# Patient Record
Sex: Male | Born: 1950 | Race: White | Hispanic: No | Smoking: Current every day smoker
Health system: Southern US, Community
[De-identification: ages and names within clinical notes are randomized; demographics above are authoritative.]

## PROBLEM LIST (undated history)

## (undated) DIAGNOSIS — G629 Polyneuropathy, unspecified: Secondary | ICD-10-CM

## (undated) DIAGNOSIS — E119 Type 2 diabetes mellitus without complications: Secondary | ICD-10-CM

## (undated) DIAGNOSIS — K56600 Partial intestinal obstruction, unspecified as to cause: Secondary | ICD-10-CM

## (undated) DIAGNOSIS — L511 Stevens-Johnson syndrome: Secondary | ICD-10-CM

## (undated) DIAGNOSIS — D751 Secondary polycythemia: Secondary | ICD-10-CM

## (undated) DIAGNOSIS — I2510 Atherosclerotic heart disease of native coronary artery without angina pectoris: Secondary | ICD-10-CM

## (undated) DIAGNOSIS — C801 Malignant (primary) neoplasm, unspecified: Secondary | ICD-10-CM

## (undated) DIAGNOSIS — Z8614 Personal history of Methicillin resistant Staphylococcus aureus infection: Secondary | ICD-10-CM

## (undated) DIAGNOSIS — E782 Mixed hyperlipidemia: Secondary | ICD-10-CM

## (undated) DIAGNOSIS — I10 Essential (primary) hypertension: Secondary | ICD-10-CM

## (undated) DIAGNOSIS — C3490 Malignant neoplasm of unspecified part of unspecified bronchus or lung: Secondary | ICD-10-CM

## (undated) DIAGNOSIS — G4733 Obstructive sleep apnea (adult) (pediatric): Secondary | ICD-10-CM

## (undated) DIAGNOSIS — I739 Peripheral vascular disease, unspecified: Secondary | ICD-10-CM

## (undated) HISTORY — DX: Mixed hyperlipidemia: E78.2

## (undated) HISTORY — PX: HEMORRHOID SURGERY: SHX153

## (undated) HISTORY — DX: Obstructive sleep apnea (adult) (pediatric): G47.33

## (undated) HISTORY — DX: Polyneuropathy, unspecified: G62.9

## (undated) HISTORY — DX: Partial intestinal obstruction, unspecified as to cause: K56.600

## (undated) HISTORY — PX: APPENDECTOMY: SHX54

## (undated) HISTORY — DX: Personal history of Methicillin resistant Staphylococcus aureus infection: Z86.14

## (undated) HISTORY — DX: Malignant (primary) neoplasm, unspecified: C80.1

## (undated) HISTORY — DX: Peripheral vascular disease, unspecified: I73.9

## (undated) HISTORY — PX: BACK SURGERY: SHX140

## (undated) HISTORY — DX: Stevens-Johnson syndrome: L51.1

## (undated) HISTORY — DX: Atherosclerotic heart disease of native coronary artery without angina pectoris: I25.10

## (undated) HISTORY — DX: Essential (primary) hypertension: I10

## (undated) HISTORY — PX: NECK SURGERY: SHX720

## (undated) HISTORY — DX: Secondary polycythemia: D75.1

## (undated) HISTORY — DX: Type 2 diabetes mellitus without complications: E11.9

## (undated) HISTORY — PX: VASECTOMY: SHX75

---

## 2002-04-13 ENCOUNTER — Emergency Department (HOSPITAL_COMMUNITY): Admission: EM | Admit: 2002-04-13 | Discharge: 2002-04-13 | Payer: Self-pay | Admitting: Emergency Medicine

## 2007-09-11 ENCOUNTER — Emergency Department: Payer: Self-pay | Admitting: Emergency Medicine

## 2007-09-13 ENCOUNTER — Encounter: Admission: RE | Admit: 2007-09-13 | Discharge: 2007-09-13 | Payer: Self-pay | Admitting: Family Medicine

## 2007-09-13 ENCOUNTER — Emergency Department (HOSPITAL_COMMUNITY): Admission: EM | Admit: 2007-09-13 | Discharge: 2007-09-14 | Payer: Self-pay | Admitting: Emergency Medicine

## 2007-09-13 ENCOUNTER — Encounter: Admission: RE | Admit: 2007-09-13 | Discharge: 2007-09-13 | Payer: Self-pay | Admitting: Orthopaedic Surgery

## 2007-09-17 ENCOUNTER — Emergency Department: Payer: Self-pay | Admitting: Emergency Medicine

## 2007-09-19 ENCOUNTER — Emergency Department (HOSPITAL_COMMUNITY): Admission: EM | Admit: 2007-09-19 | Discharge: 2007-09-19 | Payer: Self-pay | Admitting: Emergency Medicine

## 2007-11-10 ENCOUNTER — Ambulatory Visit (HOSPITAL_COMMUNITY): Admission: RE | Admit: 2007-11-10 | Discharge: 2007-11-11 | Payer: Self-pay | Admitting: Orthopedic Surgery

## 2007-12-29 ENCOUNTER — Emergency Department: Payer: Self-pay | Admitting: Emergency Medicine

## 2008-01-01 ENCOUNTER — Emergency Department (HOSPITAL_COMMUNITY): Admission: EM | Admit: 2008-01-01 | Discharge: 2008-01-01 | Payer: Self-pay | Admitting: Emergency Medicine

## 2008-01-14 ENCOUNTER — Ambulatory Visit: Payer: Self-pay | Admitting: *Deleted

## 2008-01-15 ENCOUNTER — Emergency Department: Payer: Self-pay | Admitting: Emergency Medicine

## 2008-01-15 ENCOUNTER — Inpatient Hospital Stay (HOSPITAL_COMMUNITY): Admission: EM | Admit: 2008-01-15 | Discharge: 2008-01-15 | Payer: Self-pay | Admitting: Emergency Medicine

## 2008-02-04 ENCOUNTER — Emergency Department (HOSPITAL_COMMUNITY): Admission: EM | Admit: 2008-02-04 | Discharge: 2008-02-04 | Payer: Self-pay | Admitting: Emergency Medicine

## 2008-02-24 ENCOUNTER — Emergency Department: Payer: Self-pay | Admitting: Emergency Medicine

## 2008-03-17 ENCOUNTER — Emergency Department: Payer: Self-pay | Admitting: Unknown Physician Specialty

## 2008-03-18 ENCOUNTER — Emergency Department (HOSPITAL_COMMUNITY): Admission: EM | Admit: 2008-03-18 | Discharge: 2008-03-18 | Payer: Self-pay | Admitting: Emergency Medicine

## 2008-06-15 ENCOUNTER — Inpatient Hospital Stay (HOSPITAL_COMMUNITY): Admission: EM | Admit: 2008-06-15 | Discharge: 2008-06-18 | Payer: Self-pay | Admitting: Emergency Medicine

## 2008-11-11 ENCOUNTER — Emergency Department: Payer: Self-pay | Admitting: Emergency Medicine

## 2008-11-29 ENCOUNTER — Emergency Department: Payer: Self-pay | Admitting: Emergency Medicine

## 2009-11-27 ENCOUNTER — Emergency Department (HOSPITAL_COMMUNITY): Admission: EM | Admit: 2009-11-27 | Discharge: 2009-11-27 | Payer: Self-pay | Admitting: Emergency Medicine

## 2009-12-01 ENCOUNTER — Ambulatory Visit: Payer: Self-pay | Admitting: Cardiology

## 2010-07-31 LAB — BASIC METABOLIC PANEL
CO2: 27 mEq/L (ref 19–32)
Calcium: 8.7 mg/dL (ref 8.4–10.5)
GFR calc Af Amer: >60 mL/min (ref >60)
GFR calc non Af Amer: >60 mL/min (ref >60)
Glucose, Bld: 169 mg/dL — ABNORMAL HIGH (ref 70–99)
Potassium: 3.5 mEq/L (ref 3.5–5.1)
Sodium: 134 mEq/L — ABNORMAL LOW (ref 135–145)

## 2010-08-05 LAB — OVA AND PARASITE EXAMINATION: Ova and parasites: NONE SEEN

## 2010-08-05 LAB — COMPREHENSIVE METABOLIC PANEL
AST: 26 U/L (ref 0–37)
AST: 27 U/L (ref 0–37)
Albumin: 3.7 g/dL (ref 3.5–5.2)
Albumin: 4.9 g/dL (ref 3.5–5.2)
BUN: 5 mg/dL — ABNORMAL LOW (ref 6–23)
Calcium: 9.1 mg/dL (ref 8.4–10.5)
Calcium: 9.8 mg/dL (ref 8.4–10.5)
Chloride: 107 mEq/L (ref 96–112)
Creatinine, Ser: 0.84 mg/dL (ref 0.4–1.5)
Creatinine, Ser: 0.89 mg/dL (ref 0.4–1.5)
GFR calc Af Amer: >60 mL/min (ref >60)
GFR calc Af Amer: >60 mL/min (ref >60)
GFR calc non Af Amer: >60 mL/min (ref >60)
GFR calc non Af Amer: >60 mL/min (ref >60)
Sodium: 137 mEq/L (ref 135–145)
Total Bilirubin: 0.8 mg/dL (ref 0.3–1.2)
Total Protein: 8.1 g/dL (ref 6.0–8.3)

## 2010-08-05 LAB — CBC
HCT: 39.4 % (ref 39.0–52.0)
HCT: 43.9 % (ref 39.0–52.0)
Hemoglobin: 14.9 g/dL (ref 13.0–17.0)
MCHC: 33.6 g/dL (ref 30.0–36.0)
MCHC: 33.7 g/dL (ref 30.0–36.0)
MCV: 86.7 fL (ref 78.0–100.0)
MCV: 87.1 fL (ref 78.0–100.0)
MCV: 87.7 fL (ref 78.0–100.0)
Platelets: 128 10*3/uL — ABNORMAL LOW (ref 150–400)
Platelets: 168 10*3/uL (ref 150–400)
RBC: 5.07 MIL/uL (ref 4.22–5.81)
RDW: 13.1 % (ref 11.5–15.5)
WBC: 10.4 10*3/uL (ref 4.0–10.5)
WBC: 8.5 10*3/uL (ref 4.0–10.5)

## 2010-08-05 LAB — BASIC METABOLIC PANEL
BUN: 15 mg/dL (ref 6–23)
BUN: 8 mg/dL (ref 6–23)
CO2: 22 mEq/L (ref 19–32)
Chloride: 106 mEq/L (ref 96–112)
Chloride: 107 mEq/L (ref 96–112)
Creatinine, Ser: 0.73 mg/dL (ref 0.4–1.5)
GFR calc Af Amer: >60 mL/min (ref >60)
GFR calc non Af Amer: >60 mL/min (ref >60)
Glucose, Bld: 156 mg/dL — ABNORMAL HIGH (ref 70–99)
Potassium: 3.4 mEq/L — ABNORMAL LOW (ref 3.5–5.1)
Potassium: 3.6 mEq/L (ref 3.5–5.1)
Sodium: 138 mEq/L (ref 135–145)

## 2010-08-05 LAB — DIFFERENTIAL
Eosinophils Relative: 0 % (ref 0–5)
Lymphocytes Relative: 18 % (ref 12–46)
Lymphs Abs: 2.6 10*3/uL (ref 0.7–4.0)
Monocytes Absolute: 1.1 10*3/uL — ABNORMAL HIGH (ref 0.1–1.0)
Monocytes Relative: 7 % (ref 3–12)

## 2010-08-05 LAB — STOOL CULTURE

## 2010-08-05 LAB — URINALYSIS, ROUTINE W REFLEX MICROSCOPIC
Nitrite: NEGATIVE
Specific Gravity, Urine: 1.011 (ref 1.005–1.030)
Urobilinogen, UA: 0.2 mg/dL (ref 0.0–1.0)
pH: 5.5 (ref 5.0–8.0)

## 2010-08-05 LAB — FECAL LACTOFERRIN, QUANT: Fecal Lactoferrin: NEGATIVE

## 2010-08-05 LAB — URINE MICROSCOPIC-ADD ON

## 2010-08-05 LAB — LIPASE, BLOOD: Lipase: 44 U/L (ref 11–59)

## 2010-08-05 LAB — CLOSTRIDIUM DIFFICILE EIA: C difficile Toxins A+B, EIA: NEGATIVE

## 2010-08-05 LAB — HEMOCCULT GUIAC POC 1CARD (OFFICE): Fecal Occult Bld: NEGATIVE

## 2010-09-02 NOTE — Discharge Summary (Signed)
[name], [name]              ACCOUNT NO.:  [id number]   MEDICAL RECORD NO.:  [id number]          PATIENT TYPE:  INP   LOCATION:  1308                         FACILITY:  [facility]   PHYSICIAN:  Kela Millin, M.D.DATE OF BIRTH:  [birth year]   DATE OF ADMISSION:  06/14/2008  DATE OF DISCHARGE:  06/18/2008                               DISCHARGE SUMMARY   DISCHARGE DIAGNOSES:  1. Partial small-bowel obstruction - resolved.  2. Hypokalemia - resolved.  3. Hypertension.  4. History of chronic low back pain.  5. Tobacco abuse - the patient counseled to quit.  6. History of peptic ulcer disease.   PROCEDURES AND STUDIES:  1. CT scan of the abdomen and pelvis - proximal partial small-bowel      obstruction.  Etiology not determined.  CT scan of pelvis -      contrast is present around to the rectum, indicating a partial      proximal small-bowel obstruction.  No mass or abscess detected.  2. Followup abdominal series - nonobstructed bowel gas pattern, no      free air, no acute cardiopulmonary abnormality.  Suggestion of      gastric wall thickening - artifact versus gastritis.   BRIEF HISTORY:  The patient is a 60 year old white male with the above-  listed medical problems who presented with complaints of abdominal pain  associated with nausea and vomiting.  He had a CT scan of his abdomen,  and the results are as stated above.  Lab work revealed a white cell  count of 14.  An NG tube was placed in the ED, and he was admitted for  further evaluation and management.  Initially he reported that he had  been having diarrhea in the week prior to admission but not at the time  of admission.   Please see the full admission history and physical dictated by Dr.  Rito Ehrlich for the details of the admission, physical exam as well as the  laboratory data.   HOSPITAL COURSE:  1. Partial small-bowel obstruction.  As discussed above, an NG tube      was placed in the ED and later on the same  day of admission after      he came back from walking, he had the NG tube in his hand stating      that it had come out.  He was having small loose bowel movements      and stated that his abdominal pain was decreased, his nausea and      vomiting had also resolved.  The patient was then started on a      clear liquid diet which was advanced as tolerated to a bland diet.      Stool studies were also ordered, including C diff.  The studies are      still pending at the time of this dictation.  The patient has been      tolerating a solid diet with no nausea or vomiting.  So far today,      he has had one bowel movement that is more formed and  did not have      any diarrhea yesterday.  He had been started on Protonix and      subsequently Reglan added, and he has said that this helped with      the burning abdominal pain that he was having previously.  It is      noted that he has a prior history of peptic ulcer disease, and on      the CT it indicated that gastritis could not be excluded.  Followup      abdominal series was done today, and it shows a nonobstructed bowel      gas pattern.  The partial bowel obstruction was only seen on CT      scan of the abdomen, but the patient is clinically better at this      time with formed bowel movements, decreased abdominal pain and no      nausea or vomiting on a solid diet.  His leukocytosis resolved.      His last white cell count prior to discharge was 8.5.  He will be      discharged at this time to followup outpatient.  2. Diarrhea.  The patient reported that he had been having diarrhea      for about 2 months.  Stool studies were ordered.  They are still      pending at the time of this dictation, and as noted above, he has      not had any further diarrhea in the past 2 days.  He is to follow      up with his primary care physician for the results of his stool      studies.  3. Hypokalemia.  His potassium was replaced in the hospital.  4.  Tobacco abuse.  He was counseled to quit tobacco while in the      hospital.  5. Mildly elevated liver function tests.  The patient's alkaline      phosphatase was noted to be 120 with a total bilirubin of 1.4 on      admission.  On recheck, this had resolved - total bilirubin of 0.8      and alkaline phosphatase of 105 on June 17, 2008.  The rest of      his LFTs within normal limits.  6. Hypertension.  The patient is to continue his outpatient      medications upon discharge.  7. History of low back pain.  The patient is to follow up at the Pain      Clinic.   DISCHARGE MEDICATIONS:  1. Prilosec 40 mg one p.o. b.i.d.  2. Reglan 10 mg p.o. q.a.c.  The patient to hold if having diarrhea.  3. The patient to continue atenolol 50 mg daily.  4. Lisinopril 20 mg daily.  5. Percocet p.r.n.  6. Norvasc as previously.   PENDING STUDIES:  Stool studies - C diff.  The patient to follow up with  primary care physician.   FOLLOWUP CARE:  1. Dr. Theresia Lo in 1-2 weeks, to followup on results of stool studies.  2. Pain Clinic as scheduled.   CONDITION ON DISCHARGE:  Improved - stable.      Kela Millin, M.D.  Electronically Signed     ACV/MEDQ  D:  06/18/2008  T:  06/18/2008  Job:  [id number]   cc:   Vikki Ports, M.D.  Fax: [phone]

## 2010-09-02 NOTE — H&P (Signed)
[name], [name]              ACCOUNT NO.:  [id number]   MEDICAL RECORD NO.:  [id number]          PATIENT TYPE:  INP   LOCATION:  1308                         FACILITY:  [facility]   PHYSICIAN:  Hollice Espy, M.D.DATE OF BIRTH:  1951/03/06   DATE OF ADMISSION:  06/14/2008  DATE OF DISCHARGE:                              HISTORY & PHYSICAL   PRIMARY CARE PHYSICIAN:  Dr. Lanell Persons   CHIEF COMPLAINT:  Abdominal pain.   HISTORY OF PRESENT ILLNESS:  The patient is a 60 year old white male  with past medical history of appendix removal decades ago as well as  hypertension and tobacco abuse who for the last couple of days has been  having severe right to middle upper quadrant abdominal pain with nausea  and vomiting.  When the pain got worse, he presented to the emergency  room.  The patient underwent a CT scan of the abdomen which noted a  proximal partial small bowel obstruction.  The rest of his labs were  noted for a white count 14,000.  The patient was given medication for  pain and nausea which helped somewhat but then after placement of an NG  tube, that greatly helped his abdominal discomfort.  He is currently  doing well.  He complains of sore throat secondary to the NG tube and  some mild upper quadrant abdominal pain and is still slightly nauseated.  He denies any headaches, vision changes, no chest pain, palpitations,  shortness breath, wheezing, coughing.  No hematuria, dysuria,  constipation.  He was having diarrhea  last week but not currently. No  focal extremity numbness, weakness or pain.  Review of systems otherwise  negative.   PAST MEDICAL HISTORY:  1. Tobacco abuse about half a pack a day.  2,  Hypertension.  1. Previous history of appendix removal decades ago.   MEDICATIONS:  The patient is on atenolol, lisinopril, Norvasc, Percocet.   ALLERGIES:  VANCOMYCIN and NSAIDs.   SOCIAL HISTORY:  No current alcohol or drug use.  Does smoke about half  pack a day.   FAMILY HISTORY:  Noncontributory.   PHYSICAL EXAMINATION:  VITALS:  On admission, temperature 98, heart rate  86, blood pressure 130/92, respirations 20, O2 sat 98% on room air.  GENERAL:  He is alert and oriented x3, in some mild distress secondary  to pain.  HEENT: Normocephalic atraumatic.  Mucous membranes are slightly dry.  He  has an NG tube.  HEART:  Regular rate and rhythm, S1 and S2.  LUNGS:  Clear to auscultation bilaterally.  ABDOMEN:  The abdomen is slightly soft, tender especially in the upper  quadrants as well as mildly distended. No active bowel sounds.  EXTREMITIES:  No clubbing, cyanosis or edema.   LABORATORY DATA:  Abdominal x-ray shows abnormal bowel gas pattern but  no free air, but the CT scan showed proximal partial small bowel  obstruction.  White blood count 14.5, H&H 16.1 and 49, MCV 87, platelet  count 168,000.  Alcohol level and lipase are normal.  Urinalysis notes  100 protein and mucous.  Stool for C diff has  been ordered and is  pending per the ER.   PLAN:  1. Small bowel obstruction.  Continue n.p.o., NG tube, pain and nausea      control.  Monitor for flatus.  2. Tobacco abuse.  The patient accepts nicotine patch.  3. Hypertension.  Lopressor p.r.n. Holding p.o. medications.  4. History of diarrhea, awaiting C diff cultures.      Hollice Espy, M.D.  Electronically Signed     SKK/MEDQ  D:  06/15/2008  T:  06/15/2008  Job:  [id number]   cc:   Vikki Ports, M.D.  Fax: [phone]

## 2010-09-02 NOTE — H&P (Signed)
NAMEMONTELL, LEOPARD              ACCOUNT NO.:  [id number]   MEDICAL RECORD NO.:  [id number]          PATIENT TYPE:  INP   LOCATION:  1828                         FACILITY:  [facility]   PHYSICIAN:  Glennie Isle, MD   DATE OF BIRTH:  [birth year]   DATE OF ADMISSION:  01/14/2008  DATE OF DISCHARGE:                              HISTORY & PHYSICAL   CHIEF COMPLAINT:  Chest pain and shortness of breath.   HISTORY OF PRESENT ILLNESS:  The patient is a 60 year old Caucasian male  with a history of hypertension, diabetes, tobacco abuse, positive family  history, who presents with substernal chest pain radiating to the back  and left arm after 3:00 p.m.  The patient states the chest pain was  relieved with nitroglycerin and was associated with shortness of breath.  He denies any nausea, vomiting, PND, orthopnea.  He states that the  chest pain was also relieved with morphine as well.  The patient does  state that he has radiculopathic pain, and now it is status post  discectomy and fusion.  States that has had this chest pain before.  He  states that there is more pressure and radiates to the upper left arm.  He had a left heart catheterization in [place] two years ago and was  told that he had mild to moderate coronary artery disease, not requiring  intervention.  He states that he also had some shortness of breath  earlier today.  He does state that he typically walks 3-4 miles a day  without any problems or chest pain.   PAST MEDICAL HISTORY:  1. Hypertension.  2. Diabetes.  3. Neck surgery status post cervical discectomy infusion on November 10, 2007.  4. Peptic ulcer disease.   ALLERGIES:  NSAID AND VANCOMYCIN.   MEDICATIONS:  1. Lisinopril 10 mg daily.  2. Norvasc 10 mg.  3. Atenolol 10 mg.   SOCIAL HISTORY:  One pack per day x30 years.  Patient lives with fiance,  occasional alcohol abuse.   FAMILY HISTORY:  Mother died of an MI at age 58, grandfather died of an  MI at  age 70.   REVIEW OF SYSTEMS:  A 12-point review of systems was performed and was  negative except as noted per HPI.  The patient does also complain of  some neck and back pain.   PHYSICAL EXAMINATION:  VITAL SIGNS:  Temperature 97, pulse 55, blood  pressure 106/66.  GENERAL:  No apparent distress.  HEENT:  Pupils are equal, round and reactive.  Extraocular movements  intact.  Oropharynx clear.  Mucous membranes moist.  NECK:  Supple.  No thyromegaly is noted.  No JVD or bruits.  CARDIOVASCULAR:  S1, S2 are normal.  No murmurs, rubs heard.  No  displacement of PMI.  Pulses are 2+.  LUNGS:  Clear to auscultation bilaterally.  SKIN:  No rashes.  ABDOMEN:  Soft, nontender, nondistended.  EXTREMITIES:  No cyanosis, clubbing or edema is noted.  MUSCULOSKELETAL:  No joint deformities note.  The patient does have  cervical spine tenderness.  NEUROLOGICAL:  Alert  and oriented x3.  Cranial nerves II-XII grossly  intact.  Strength 5/5 upper and lower extremities.  Sensation is normal.   STUDIES:  X-ray:  No acute cardiopulmonary process.  EKG shows normal  sinus rhythm.  The patient is bradycardic with a rate of approximately  50.  There are no ST changes.   LABORATORY DATA:  First set of markers are negative.  Troponin less than  0.05.  CK MB less than 1.0.  All other odds are currently pending.   ASSESSMENT/PLAN:  1. Chest pain.  The patient has multiple risk factors.  He does have a      history of duodenal ulcer several years ago.  However, denies any      melena or hematochezia.  We will start enteric coated aspirin 81 mg      daily and change to metoprolol 12.5 b.i.d. and hold her heart rate      less than 60.  We will continue the patient's ACE inhibitor and      start nitroglycerin.  Will check a fasting lipid panel as well.      The patient was encouraged to stop smoking.  Will check cardiac      markers tonight.  We will consider a stress test, and if the      patient's cardiac  markers are negative, he actually may have this      done as an outpatient as he is currently chest pain free.  The      patient did have good functional status prior including walking      several miles a day without any chest pain.  We will start heparin      given his multiple risk factors.  However, the second set is      negative.  We may discontinue this.  2. Hypertension.  We will change atenolol to metoprolol given sinus      bradycardia and short acting metoprolol.  Will continue with ACE      inhibitor and start nitroglycerin.  Will hold off on calcium      channel blocker tonight.  3. Obstructive sleep apnea.  The patient may use his home CPAP.      Glennie Isle, MD  Electronically Signed     SS/MEDQ  D:  01/14/2008  T:  01/15/2008  Job:  [id number]

## 2010-09-02 NOTE — Op Note (Signed)
NAMECLARE, CASTO              ACCOUNT NO.:  [id number]   MEDICAL RECORD NO.:  [id number]          PATIENT TYPE:  OIB   LOCATION:  3533                         FACILITY:  [facility]   PHYSICIAN:  Alvy Beal, MD    DATE OF BIRTH:  1950-05-31   DATE OF PROCEDURE:  DATE OF DISCHARGE:  11/11/2007                               OPERATIVE REPORT   PREOPERATIVE DIAGNOSIS:  Degenerative cervical disk disease with left  radicular arm pain C5-6 and C6-7.   POSTOPERATIVE DIAGNOSIS:  Degenerative cervical disk disease with left  radicular arm pain C5-6 and C6-7.   OPERATIVE PROCEDURE:  Anterior cervical diskectomy and fusion.   INSTRUMENTATION USED:  Synthes anterior cervical vector plate with 16-XW  variable screws placed into the body of C5, 14-mm screws into the bodies  of C5 and C6, 7-mm fibular allograft struts packed with Actifuse.  No  other complications.   FIRST ASSISTANT:  Crissie Reese, PA   HISTORY:  This is a very pleasant 60 year old gentleman who is having  significant neck and left arm pain for sometime now.  Attempts of  conservative management consisting of physical therapy, injection  therapy, narcotic medications, and traction failed.  Clinical and  radiographic analysis confirmed the diagnosis of cervical hard disk  osteophyte with radicular arm pain.  As a result of failure of  conservative management, he elected to proceed with surgery.  All  appropriate risks, benefits, and alternatives were discussed and  consent was obtained.   OPERATIVE NOTE:  The patient was brought to the operating room and  placed supine on the operating table.  After successful induction of  general anesthesia and endotracheal intubation, TED, SCDs, and a Foley  were applied.  The patient's arm was secured to side.  Towels were  placed on anterior shoulder blades and shoulders themselves were taped  down.  The anterior cervical spine was then prepped and draped in a  standard  fashion.   A horizontal incision was made just below the level of the thyroid  cartilage and sharp dissection was carried out down through the  platysma.  I then sharply dissected through the deep cervical fascia  keeping the sternocleidomastoid muscle lateral.  I then was able to  palpate the trachea and esophagus and mobilize it with a finger, meet to  the right-hand side.  The carotid sheath was palpated and protected  laterally.  I then placed an appendiceal retractor and then used a  Pension scheme manager to mobilize the prevertebral fascia to completely  expose the anterior longitudinal ligament.  I then placed a needle into  the body at C5-6 disk space, took an x-ray, and confirmed the  appropriate level.   I then mobilized the anterior longitudinal ligament from the midbody of  C5 to the midbody of C7 and mobilized the longus colli muscles out to  the level of the uncovertebral joints bilaterally.  At this point, the  anterior cervical spine was appropriately visualized and self-retaining  retractors were placed into the wound.  The endotracheal cuff was  deflated.  The Charnley retractor was expanded and the  tracheal cuff was  reinflated.  Distraction pins were placed into the body of C5 and C6 and  C5-6 disk space was distracted.  I incised the annulus with a 15 blade  scalpel, then using a combination of pituitary rongeurs, curettes and  Kerrison rongeurs, I removed all of the disk material.  I then used a  fine microcurette and micro nerve hook to develop a plane underneath the  posterior longitudinal ligament.  I then resected the entire posterior  longitudinal ligament.  There was a rather substantial free fragment  disk material on that left hand side that I was able to remove.   At this point with the diskectomy complete, the posterior longitudinal  excised from adequate space, I used a rasp to decorticate the  subchondral bone and then measured the interbody space.  I then  passed a  7-mm precut fibular allograft strut with Actifuse and  malleted it to  appropriate depth.  I then removed the distraction pins of C5 and placed  a new one into the body of C7.  I distracted the C6-7 disk space in a  similar fashion to what was performed at C5-6.  I performed another  diskectomy at C6-7.  Once I had adequately excised the disk material,  and I measured the interbody space and placed a 7-mm precut fibular  allograft strut packed with Actifuse to the appropriate depth.  At this  point, we had both decompressions and diskectomies complete.  I then  contoured a 34 mm long vector anterior cervical Synthes vector plate and  then secured it to the bodies of C5, C6 and C7 with variable angle self-  drilling screws.  I removed all the remaining retractors and then  checked again to ensure that the esophagus was not entrapped beneath the  plate at any point.  Once I was confirmed this, I irrigated copiously  with normal saline, returned the trachea and esophagus to midline and  then closed the platysma with interrupted 2-0 Vicryl sutures, skin with  a 3-0 Monocryl.  Steri-Strips, dry dressing, and a collar were applied.  The patient was extubated and transferred to PACU without incident.      Alvy Beal, MD  Electronically Signed     DDB/MEDQ  D:  11/10/2007  T:  11/11/2007  Job:  [id number]

## 2010-09-05 NOTE — Discharge Summary (Signed)
NAMEHOBART, [name]              ACCOUNT NO.:  [id number]   MEDICAL RECORD NO.:  [id number]          PATIENT TYPE:  INP   LOCATION:  2014                         FACILITY:  [facility]   PHYSICIAN:  Bevelyn Buckles. Bensimhon, MDDATE OF BIRTH:  1951/04/12   DATE OF ADMISSION:  01/14/2008  DATE OF DISCHARGE:  01/15/2008                               DISCHARGE SUMMARY   DISCHARGE DIAGNOSES:  1. Chest pain.  2. Left against medical advice.   HOSPITAL COURSE:  Mr. Durbin is a 60 year old male with a history of  hypertension, diabetes, ongoing tobacco use, and chronic back and neck  pain, who presented to the [facility] Emergency Room on January 14, 2008, complaining of chest pain that radiated to his back and upper left  arm.  Several weeks prior to presentation, he had a diskectomy and had  chronic neck and arm pain with this.  He also had a cardiac  catheterization approximately 2 years prior to his presentation in  [place] which he said he had mild-to-moderate coronary artery disease.  While in the emergency room, his pain was controlled with narcotic  medication.  His EKG and the cardiac markers were normal.  He was  admitted to rule out myocardial infarction.   While on the floor, he continued to have severe back and neck pain;  however, his chest pain was resolved.  He got quite upset when he did  not receive adequate narcotic medication and threatened to leave against  medical advice and unless he was given Dilaudid PCA pump.  As he did not  get this, he eventually pulled his IV out and left against the medical  advice.  This was well documented by the nursing staff.  He is advised  to followup with his primary care physician.      Bevelyn Buckles. Bensimhon, MD  Electronically Signed     DRB/MEDQ  D:  04/06/2008  T:  04/07/2008  Job:  [id number]

## 2010-12-21 DIAGNOSIS — R079 Chest pain, unspecified: Secondary | ICD-10-CM

## 2010-12-23 ENCOUNTER — Ambulatory Visit (HOSPITAL_COMMUNITY)
Admission: AD | Admit: 2010-12-23 | Discharge: 2010-12-24 | Disposition: A | Discharge status: Home / Self Care | Payer: Self-pay | Source: Other Acute Inpatient Hospital | Attending: Cardiology | Admitting: Cardiology

## 2010-12-23 DIAGNOSIS — E871 Hypo-osmolality and hyponatremia: Secondary | ICD-10-CM | POA: Insufficient documentation

## 2010-12-23 DIAGNOSIS — I2510 Atherosclerotic heart disease of native coronary artery without angina pectoris: Secondary | ICD-10-CM

## 2010-12-23 DIAGNOSIS — R079 Chest pain, unspecified: Secondary | ICD-10-CM | POA: Insufficient documentation

## 2010-12-23 DIAGNOSIS — M5030 Other cervical disc degeneration, unspecified cervical region: Secondary | ICD-10-CM | POA: Insufficient documentation

## 2010-12-23 DIAGNOSIS — F17200 Nicotine dependence, unspecified, uncomplicated: Secondary | ICD-10-CM | POA: Insufficient documentation

## 2010-12-23 DIAGNOSIS — I10 Essential (primary) hypertension: Secondary | ICD-10-CM | POA: Insufficient documentation

## 2010-12-23 DIAGNOSIS — Z8711 Personal history of peptic ulcer disease: Secondary | ICD-10-CM | POA: Insufficient documentation

## 2010-12-23 DIAGNOSIS — I200 Unstable angina: Secondary | ICD-10-CM

## 2010-12-23 DIAGNOSIS — D696 Thrombocytopenia, unspecified: Secondary | ICD-10-CM | POA: Insufficient documentation

## 2010-12-23 DIAGNOSIS — Z8249 Family history of ischemic heart disease and other diseases of the circulatory system: Secondary | ICD-10-CM | POA: Insufficient documentation

## 2010-12-23 DIAGNOSIS — E119 Type 2 diabetes mellitus without complications: Secondary | ICD-10-CM | POA: Insufficient documentation

## 2010-12-23 DIAGNOSIS — I701 Atherosclerosis of renal artery: Secondary | ICD-10-CM

## 2010-12-23 LAB — POCT ACTIVATED CLOTTING TIME: Activated Clotting Time: 122 seconds

## 2010-12-23 LAB — COMPREHENSIVE METABOLIC PANEL
Albumin: 3.4 g/dL — ABNORMAL LOW (ref 3.5–5.2)
Alkaline Phosphatase: 72 U/L (ref 39–117)
BUN: 23 mg/dL (ref 6–23)
CO2: 26 mEq/L (ref 19–32)
Chloride: 96 mEq/L (ref 96–112)
Creatinine, Ser: 1.1 mg/dL (ref 0.50–1.35)
GFR calc Af Amer: >60 mL/min (ref >60)
GFR calc non Af Amer: >60 mL/min (ref >60)
Glucose, Bld: 205 mg/dL — ABNORMAL HIGH (ref 70–99)
Potassium: 4.1 mEq/L (ref 3.5–5.1)
Total Bilirubin: 0.3 mg/dL (ref 0.3–1.2)

## 2010-12-23 LAB — CBC
HCT: 50.7 % (ref 39.0–52.0)
MCHC: 34.3 g/dL (ref 30.0–36.0)
MCV: 86.8 fL (ref 78.0–100.0)
Platelets: 144 10*3/uL — ABNORMAL LOW (ref 150–400)
RDW: 14.7 % (ref 11.5–15.5)

## 2010-12-24 DIAGNOSIS — R079 Chest pain, unspecified: Secondary | ICD-10-CM

## 2010-12-24 LAB — BASIC METABOLIC PANEL
Calcium: 8.5 mg/dL (ref 8.4–10.5)
GFR calc Af Amer: >60 mL/min (ref >60)
GFR calc non Af Amer: >60 mL/min (ref >60)
Glucose, Bld: 180 mg/dL — ABNORMAL HIGH (ref 70–99)
Sodium: 132 mEq/L — ABNORMAL LOW (ref 135–145)

## 2010-12-24 LAB — CBC
Hemoglobin: 18.3 g/dL — ABNORMAL HIGH (ref 13.0–17.0)
MCH: 30.6 pg (ref 26.0–34.0)
MCHC: 34.7 g/dL (ref 30.0–36.0)
RDW: 14.8 % (ref 11.5–15.5)

## 2010-12-24 LAB — HEMOGLOBIN A1C: Hgb A1c MFr Bld: 9.4 % — ABNORMAL HIGH (ref <5.7)

## 2010-12-29 NOTE — Cardiovascular Report (Signed)
NAMEJONAN, SEUFERT NO.:  [id number]  MEDICAL RECORD NO.:  [id number]  LOCATION:  2902                         FACILITY:  [facility]  PHYSICIAN:  Vesta Mixer, M.D. DATE OF BIRTH:  Aug 01, 1950  DATE OF PROCEDURE:  12/23/2010 DATE OF DISCHARGE:                           CARDIAC CATHETERIZATION   [name] is a 60 year old gentleman with a long history of cigarette smoking.  He presents with episodes of chest pain.  He has been found to have a moderate coronary artery disease in the past.  He was also found to have an extremely high hemoglobin and high hematocrit. His hematocrit is 50-60%.  He is scheduled for cardiac catheterization.  The procedure was left heart catheterization with coronary angiography. The right femoral artery was cannulated using the assistance of a SmartNeedle.  His pulses were somewhat diminished.  HEMODYNAMIC RESULTS:  LV pressure was 103/12.  Aortic pressure was 104/58.  ANGIOGRAPHY:  Left main.  The left main is fairly normal.  The left anterior descending artery has only minor luminal irregularities.  The flow down the LAD is somewhat sluggish, but there are no critical lesions to contribute to this.  I suspect that there is some sludging because of the high hematocrit.  His first diagonal artery has a 40-50% stenosis at its origin.  The left circumflex artery is large and dominant.  It gives off a large first obtuse marginal artery which is normal.  The distal circumflex is unremarkable.  The posterior descending artery and the posterolateral segment artery are normal.  The ramus intermediate vessel has minor luminal irregularities.  The right coronary artery is small and is nondominant.  There are minor to moderate irregularities.  The RV marginal branch is normal.  The left ventriculogram was performed in a 30-RAO position and reveals normal left ventricular systolic function.  Ejection fraction is approximately  60-65%.  The distal aortogram was performed because of some difficulty in getting up into the central circulation.  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  This stenosis does not appear to be critical at this time.  COMPLICATIONS:  None.  CONCLUSIONS: 1. Minor coronary artery irregularities. 2. Normal left ventricular systolic function. 3. He has sluggish flow which is particularly visible down the left     anterior descending.  I think that this is possibly due to his     elevated hematocrit and possible sludging.  There are no lesions to     address using percutaneous coronary intervention.  The patient     definitely needs to quit smoking, so that his hemoglobin will come     down towards normal.  He may need a phlebotomy.  The patient also has significant peripheral vascular disease, although I am not sure that he is  symptomatic with this.  He will need to follow up with our Vascular or perhaps [facility] for further evaluation at some point.  The lesion is not critical and so I do not think that he necessarily needs revascularization during this admission.     Vesta Mixer, M.D.     PJN/MEDQ  D:  12/23/2010  T:  12/24/2010  Job:  [id number]  cc:   Jonelle Sidle, MD  Electronically Signed by Kristeen Miss M.D. on 12/29/2010 07:45:13 PM

## 2011-01-01 NOTE — Discharge Summary (Signed)
  [name], [name]              ACCOUNT NO.:  [id number]  MEDICAL RECORD NO.:  [id number]  LOCATION:  2902                         FACILITY:  [facility]  PHYSICIAN:  Verne Carrow, MDDATE OF BIRTH:  [birth year]  DATE OF ADMISSION:  12/23/2010 DATE OF DISCHARGE:  12/24/2010                              DISCHARGE SUMMARY   DISCHARGE MEDICATIONS: 1. Lisinopril/hydrochlorothiazide 20/12.5 mg b.i.d. 2. Gabapentin 600 mg one and a half tablets t.i.d. 3. Atenolol 100 mg a day. 4. Metformin 500 mg two tablets b.i.d., hold for two days and restart     on December 26, 2010. 5. Norvasc 5 mg is discontinued. 6. Norvasc 10 mg daily. 7. Humulin 70/30 9 units q.a.m. 8. Pravachol 80 mg daily. 9. Sliding scale NovoLog as prior to admission. 10.Aspirin 81 mg a day. 11.Glucotrol XL 5 mg b.i.d.     Theodore Demark, PA-C   ______________________________ Verne Carrow, MD    RB/MEDQ  D:  12/24/2010  T:  12/24/2010  Job:  [id number]  Electronically Signed by Theodore Demark PA-C on 01/01/2011 03:24:36 PM Electronically Signed by Verne Carrow MD on 01/01/2011 09:36:40 PM

## 2011-01-01 NOTE — Discharge Summary (Signed)
[name], [name]              ACCOUNT NO.:  [id number]  MEDICAL RECORD NO.:  [id number]  LOCATION:  2902                         FACILITY:  [facility]  PHYSICIAN:  Verne Carrow, MDDATE OF BIRTH:  1950-10-27  DATE OF ADMISSION:  12/23/2010 DATE OF DISCHARGE:  12/24/2010                              DISCHARGE SUMMARY   PROCEDURES: 1. Cardiac catheterization. 2. Coronary arteriogram. 3. Left ventriculogram. 4. Distal aortogram.  PRIMARY FINAL DISCHARGE DIAGNOSIS:  Chest pain.  SECONDARY DIAGNOSES: 1. Diabetes. 2. Hypertension. 3. Ongoing tobacco use. 4. History of peptic ulcer disease. 5. Partial small bowel obstruction in 2010, managed medically. 6. Degenerative cervical disk disease, status post surgery. 7. Family history of coronary artery disease. 8. Allergy or intolerance to VANCOMYCIN and PREDNISONE. 9. Elevated hemoglobin and hematocrit, hemoglobin 18.3 and hematocrit     52.7 at discharge. 10.Thrombocytopenia with platelet count of 126 at discharge. 11.Hyponatremia with a sodium of 132 at discharge. 12.History of sleep apnea. 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 14.Status post back surgery x3, hemorrhoid surgery and appendectomy.  TIME AT DISCHARGE:  31 minutes.  HOSPITAL COURSE:  [name] is a 60 year old male with a history of nonobstructive coronary artery disease in the past.  He went to [facility] with chest pain.  His symptoms were concerning for unstable anginal pain.  He was transferred to [facility] and had ongoing pain, so he was taken directly to the Cath Lab.  The cardiac catheterization showed somewhat sluggish flow down the LAD, but there were no critical lesions.  The sludging was felt secondary to a high hematocrit.  The first diagonal had a 50% stenosis.  The circumflex was dominant and had no significant disease.  The ramus intermedius had luminal irregularities and the RCA had minor to  moderate irregularities.  His EF was 60-65%.  A distal aortogram was performed because of difficulty getting into his central circulation.  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  That stenosis did not appear to be critical.  [name] was held overnight and hydrated.  On December 24, 2010, [name] was seen by Dr. Clifton James.  Statin was added to his medication regimen and his blood pressure medicine was increased.  Dr. Clifton James considered him stable for discharge, to follow up as an outpatient.  DISCHARGE INSTRUCTIONS: 1. His activity level is to be increased gradually. 2. He is not to drive for 2 days and no lifting for a week. 3. He is to call our office for problems with cath site. 4. He is encouraged to stick to a low-sodium, diabetic diet. 5. He is encouraged to stop smoking. 6. He is to follow up with Dr. Diona Browner at the [facility] office on     February 13, 2011, at 3:40. 7. He is encouraged to follow up with primary care as well.  DISCHARGE MEDICATIONS:  Will be dictated separately.      Theodore Demark, PA-C   ______________________________ Verne Carrow, MD    RB/MEDQ  D:  12/24/2010  T:  12/24/2010  Job:  [id number]  Electronically Signed by Theodore Demark PA-C on 01/01/2011 03:25:11 PM Electronically Signed by Verne Carrow  MD on 01/01/2011 09:36:42 PM

## 2011-01-16 LAB — CBC
HCT: 39.1
HCT: 40.7
Hemoglobin: 13.4
MCV: 85.6
MCV: 85.8
Platelets: 164
Platelets: 167
RDW: 13.5
RDW: 13.9

## 2011-01-16 LAB — BASIC METABOLIC PANEL
BUN: 11
BUN: 13
Chloride: 106
Creatinine, Ser: 0.85
GFR calc non Af Amer: >60
GFR calc non Af Amer: >60
Glucose, Bld: 101 — ABNORMAL HIGH
Potassium: 3.8

## 2011-01-19 LAB — PROTIME-INR
INR: 0.9
Prothrombin Time: 12.5

## 2011-01-19 LAB — DIFFERENTIAL
Basophils Relative: 0
Lymphocytes Relative: 28
Lymphs Abs: 3.3
Monocytes Absolute: 1.2 — ABNORMAL HIGH
Monocytes Relative: 10
Neutro Abs: 7.2

## 2011-01-19 LAB — BASIC METABOLIC PANEL
BUN: 11
CO2: 27
Chloride: 103
Creatinine, Ser: 0.79
GFR calc Af Amer: >60
Potassium: 3.1 — ABNORMAL LOW

## 2011-01-19 LAB — APTT: aPTT: 27

## 2011-01-19 LAB — CBC
HCT: 44.3
MCHC: 33.5
MCV: 88.3
RBC: 5.02

## 2011-01-19 LAB — GLUCOSE, CAPILLARY: Glucose-Capillary: 107 — ABNORMAL HIGH

## 2011-01-19 LAB — LIPID PANEL
Cholesterol: 196
LDL Cholesterol: 112 — ABNORMAL HIGH

## 2011-01-19 LAB — TROPONIN I: Troponin I: <0.01

## 2011-01-19 LAB — CK TOTAL AND CKMB (NOT AT ARMC): Relative Index: INVALID

## 2011-01-19 LAB — HEMOGLOBIN A1C: Mean Plasma Glucose: 123

## 2011-02-11 ENCOUNTER — Telehealth: Payer: Self-pay | Admitting: Cardiology

## 2011-02-11 NOTE — Telephone Encounter (Signed)
Pt is returning call, he is unsure what the call is about. He doesn't have voice mail, but did confirm his appointment for this Friday. Stated you could just try back if you need to talk to him.

## 2011-02-12 ENCOUNTER — Encounter: Payer: Self-pay | Admitting: Cardiology

## 2011-02-13 ENCOUNTER — Ambulatory Visit (INDEPENDENT_AMBULATORY_CARE_PROVIDER_SITE_OTHER): Payer: Self-pay | Admitting: Cardiology

## 2011-02-13 ENCOUNTER — Encounter: Payer: Self-pay | Admitting: Cardiology

## 2011-02-13 VITALS — BP 122/71 | HR 71 | Ht 71.0 in | Wt 197.0 lb

## 2011-02-13 DIAGNOSIS — I2510 Atherosclerotic heart disease of native coronary artery without angina pectoris: Secondary | ICD-10-CM | POA: Insufficient documentation

## 2011-02-13 DIAGNOSIS — Z720 Tobacco use: Secondary | ICD-10-CM | POA: Insufficient documentation

## 2011-02-13 DIAGNOSIS — I10 Essential (primary) hypertension: Secondary | ICD-10-CM | POA: Insufficient documentation

## 2011-02-13 DIAGNOSIS — E782 Mixed hyperlipidemia: Secondary | ICD-10-CM | POA: Insufficient documentation

## 2011-02-13 DIAGNOSIS — F17200 Nicotine dependence, unspecified, uncomplicated: Secondary | ICD-10-CM

## 2011-02-13 DIAGNOSIS — I739 Peripheral vascular disease, unspecified: Secondary | ICD-10-CM

## 2011-02-13 NOTE — Assessment & Plan Note (Signed)
Distal angiography demonstrated a 70% right iliac stenosis at catheterization. He could certainly have some claudication symptoms referable to this, and we discussed this today, as well as other functional testing that could be arranged. However, in light of his reported significant prior back surgeries and "nerve problems" it is most likely that his back and leg discomfort is multifactorial. At this point, I recommended strongly that he quit smoking, and consider a basic walking regimen. I also offered lower extremity ABIs, however he did not want to pursue this testing at this point.

## 2011-02-13 NOTE — Assessment & Plan Note (Addendum)
Nonobstructive, recently documented at cardiac catheterization. He could have intermittent symptoms related to endothelial dysfunction and/or microvascular dysfunction, however this would be managed medically and with further lifestyle modification. I discussed this in detail with him today, as reviewed above. LVEF is normal as well. He was actually supposed to follow up with Dr. Andee Lineman, as he had seen him in the past. Appropriate followup will be arranged.

## 2011-02-13 NOTE — Progress Notes (Signed)
Clinical Summary [name] is a 60 y.o.male presenting for a post hospital followup. He was seen by Dr. Andee Campbell last year at [facility] in the setting of chest pain, had a reassuring Cardiolite done at that time. More recently in September, he presented again with chest pain to [facility], ruled out for myocardial infarction, and was transferred to [facility] for a diagnostic cardiac catheterization in light of his robust cardiac risk profile. Fortunately, he did not have evidence of obstructive CAD, essentially mild to moderate atherosclerosis with slow flow noted in the LAD. It was suggested that this could be related to his relative erythrocytosis (likely secondary to smoking), though certainly with his risk factor profile, endothelial dysfunction and microvascular dysfunction are just as likely. He was also diagnosed with PAD involving the right iliac.  He is here with his girlfriend today. He denies any chest pain. He states he is undergoing disability determinations, mainly related to a reported long term history of significant lower back pain and leg pain, that he states limits his ability to function in a stable job. I do not have any details about his prior back surgery, or objective assessment of his functional limitation in this regard.  I reviewed the results of his cardiac catheterization, as well as findings of right iliac disease. I discussed with him in detail strategies for smoking cessation, and strongly recommended smoking cessation to reduce future adverse cardiac and peripheral vascular events. I also explained the importance of maintaining follow up with a primary care provider for close management of diabetes and hypertension over time. His LDL was noted to be well under 100. A regular walking regimen as tolerated would also be optimal.  We discussed mainly lifestyle modification strategies today, as revascularization options at this point would not significantly alter his risk of  MI or cardiac death.  Patient states that he sees Dr. Greggory Campbell at the [facility] in [place].  Allergies  Allergen Reactions  . Vancomycin     Caused Stevens-Johnsons syndrome    Medication list reviewed.  Past Medical History  Diagnosis Date  . Essential hypertension, benign   . Type 2 diabetes mellitus   . Mixed hyperlipidemia   . OSA (obstructive sleep apnea)   . Partial small bowel obstruction   . Stevens-Johnson syndrome     Vancomycin  . Coronary atherosclerosis of native coronary artery     Nonobstructive  . Erythrocytosis   . Peripheral arterial disease     70% right iliac  . History of MRSA infection     Past Surgical History  Procedure Date  . Appendectomy   . Neck surgery   . Back surgery   . Hemorrhoid surgery     Family History  Problem Relation Age of Onset  . Coronary artery disease    . Hypertension      Social History [name] reports that he has been smoking Cigarettes.  He has a 40 pack-year smoking history. He has never used smokeless tobacco. [name] reports that he does not drink alcohol.  Review of Systems No palpitations or syncope. Otherwise as outlined above.  Physical Examination Filed Vitals:   02/13/11 1321  BP: 122/71  Pulse: 71   Overweight male in no acute distress. HEENT: Conjunctiva and lids normal, oropharynx with moist mucosa. Neck: Supple, no elevated JVP or carotid bruits, no thyromegaly. Lungs: Clear to auscultation with diminished breath sounds, no active wheezing or labored breathing. Cardiac: Regular rate and rhythm, no  S3 gallop or rub. Abdomen: Soft, nontender, bowel sounds present. Skin: Warm and dry. Studies: No pitting edema, distal pulses one plus. Musculoskeletal: No kyphosis. Neuropsychiatric: Alert and oriented x3, moves all extremities.    Problem List and Plan

## 2011-02-13 NOTE — Assessment & Plan Note (Signed)
Blood pressure is well-controlled today. 

## 2011-02-13 NOTE — Assessment & Plan Note (Signed)
Recommend LDL control under 100, continuing statin therapy for now.

## 2011-02-13 NOTE — Patient Instructions (Signed)
Your physician wants you to follow-up in: 6 months with Dr. De Gent. You will receive a reminder letter in the mail one-two months in advance. If you don't receive a letter, please call our office to schedule the follow-up appointment. Your physician recommends that you continue on your current medications as directed. Please refer to the Current Medication list given to you today. 

## 2011-02-13 NOTE — Assessment & Plan Note (Signed)
Smoking cessation was discussed in detail today.

## 2011-04-21 DIAGNOSIS — G629 Polyneuropathy, unspecified: Secondary | ICD-10-CM

## 2011-04-21 HISTORY — DX: Polyneuropathy, unspecified: G62.9

## 2013-04-20 DIAGNOSIS — C801 Malignant (primary) neoplasm, unspecified: Secondary | ICD-10-CM

## 2013-04-20 HISTORY — DX: Malignant (primary) neoplasm, unspecified: C80.1

## 2014-02-14 ENCOUNTER — Other Ambulatory Visit: Payer: Self-pay

## 2014-02-14 ENCOUNTER — Emergency Department (HOSPITAL_COMMUNITY): Payer: Medicare Other

## 2014-02-14 ENCOUNTER — Encounter (HOSPITAL_COMMUNITY): Payer: Self-pay | Admitting: Emergency Medicine

## 2014-02-14 ENCOUNTER — Observation Stay (HOSPITAL_COMMUNITY)
Admission: EM | Admit: 2014-02-14 | Discharge: 2014-02-14 | Disposition: A | Discharge status: Home / Self Care | Payer: Medicare Other | Attending: Family Medicine | Admitting: Family Medicine

## 2014-02-14 DIAGNOSIS — M542 Cervicalgia: Secondary | ICD-10-CM | POA: Diagnosis not present

## 2014-02-14 DIAGNOSIS — G4733 Obstructive sleep apnea (adult) (pediatric): Secondary | ICD-10-CM | POA: Diagnosis not present

## 2014-02-14 DIAGNOSIS — Z794 Long term (current) use of insulin: Secondary | ICD-10-CM | POA: Diagnosis not present

## 2014-02-14 DIAGNOSIS — Z8614 Personal history of Methicillin resistant Staphylococcus aureus infection: Secondary | ICD-10-CM | POA: Diagnosis not present

## 2014-02-14 DIAGNOSIS — K5669 Other intestinal obstruction: Secondary | ICD-10-CM | POA: Diagnosis not present

## 2014-02-14 DIAGNOSIS — E119 Type 2 diabetes mellitus without complications: Secondary | ICD-10-CM | POA: Diagnosis not present

## 2014-02-14 DIAGNOSIS — M545 Low back pain: Secondary | ICD-10-CM | POA: Diagnosis not present

## 2014-02-14 DIAGNOSIS — E782 Mixed hyperlipidemia: Secondary | ICD-10-CM | POA: Insufficient documentation

## 2014-02-14 DIAGNOSIS — I10 Essential (primary) hypertension: Secondary | ICD-10-CM | POA: Diagnosis not present

## 2014-02-14 DIAGNOSIS — Z23 Encounter for immunization: Secondary | ICD-10-CM | POA: Insufficient documentation

## 2014-02-14 DIAGNOSIS — I739 Peripheral vascular disease, unspecified: Secondary | ICD-10-CM | POA: Diagnosis not present

## 2014-02-14 DIAGNOSIS — R0789 Other chest pain: Secondary | ICD-10-CM | POA: Diagnosis not present

## 2014-02-14 DIAGNOSIS — Z872 Personal history of diseases of the skin and subcutaneous tissue: Secondary | ICD-10-CM | POA: Insufficient documentation

## 2014-02-14 DIAGNOSIS — I2510 Atherosclerotic heart disease of native coronary artery without angina pectoris: Secondary | ICD-10-CM | POA: Insufficient documentation

## 2014-02-14 DIAGNOSIS — Z862 Personal history of diseases of the blood and blood-forming organs and certain disorders involving the immune mechanism: Secondary | ICD-10-CM | POA: Diagnosis not present

## 2014-02-14 DIAGNOSIS — R079 Chest pain, unspecified: Secondary | ICD-10-CM | POA: Diagnosis present

## 2014-02-14 DIAGNOSIS — R61 Generalized hyperhidrosis: Secondary | ICD-10-CM | POA: Diagnosis not present

## 2014-02-14 DIAGNOSIS — Z79899 Other long term (current) drug therapy: Secondary | ICD-10-CM | POA: Insufficient documentation

## 2014-02-14 DIAGNOSIS — R0602 Shortness of breath: Secondary | ICD-10-CM

## 2014-02-14 DIAGNOSIS — Z720 Tobacco use: Secondary | ICD-10-CM | POA: Insufficient documentation

## 2014-02-14 LAB — COMPREHENSIVE METABOLIC PANEL
ALBUMIN: 4.3 g/dL (ref 3.5–5.2)
ALT: 28 U/L (ref 0–53)
ANION GAP: 15 (ref 5–15)
AST: 30 U/L (ref 0–37)
Alkaline Phosphatase: 98 U/L (ref 39–117)
BILIRUBIN TOTAL: 0.3 mg/dL (ref 0.3–1.2)
BUN: 27 mg/dL — AB (ref 6–23)
CHLORIDE: 96 meq/L (ref 96–112)
CO2: 24 mEq/L (ref 19–32)
CREATININE: 1.01 mg/dL (ref 0.50–1.35)
Calcium: 9.6 mg/dL (ref 8.4–10.5)
GFR calc Af Amer: >90 mL/min (ref >90)
GFR calc non Af Amer: 78 mL/min — ABNORMAL LOW (ref >90)
Glucose, Bld: 181 mg/dL — ABNORMAL HIGH (ref 70–99)
Potassium: 4.3 mEq/L (ref 3.7–5.3)
Sodium: 135 mEq/L — ABNORMAL LOW (ref 137–147)
Total Protein: 8 g/dL (ref 6.0–8.3)

## 2014-02-14 LAB — CBC WITH DIFFERENTIAL/PLATELET
BASOS PCT: 0 % (ref 0–1)
Basophils Absolute: 0 10*3/uL (ref 0.0–0.1)
EOS ABS: 0.2 10*3/uL (ref 0.0–0.7)
Eosinophils Relative: 3 % (ref 0–5)
HEMATOCRIT: 37.4 % — AB (ref 39.0–52.0)
HEMOGLOBIN: 12.5 g/dL — AB (ref 13.0–17.0)
Lymphocytes Relative: 18 % (ref 12–46)
Lymphs Abs: 1.6 10*3/uL (ref 0.7–4.0)
MCH: 29.3 pg (ref 26.0–34.0)
MCHC: 33.4 g/dL (ref 30.0–36.0)
MCV: 87.8 fL (ref 78.0–100.0)
MONO ABS: 0.9 10*3/uL (ref 0.1–1.0)
MONOS PCT: 10 % (ref 3–12)
NEUTROS ABS: 6.2 10*3/uL (ref 1.7–7.7)
Neutrophils Relative %: 69 % (ref 43–77)
Platelets: 151 10*3/uL (ref 150–400)
RBC: 4.26 MIL/uL (ref 4.22–5.81)
RDW: 14.6 % (ref 11.5–15.5)
WBC: 8.9 10*3/uL (ref 4.0–10.5)

## 2014-02-14 LAB — TROPONIN I
Troponin I: <0.3 ng/mL (ref <0.30)
Troponin I: <0.3 ng/mL (ref <0.30)

## 2014-02-14 MED ORDER — SODIUM CHLORIDE 0.9 % IV SOLN
INTRAVENOUS | Status: DC
  Administered 2014-02-14: 20:00:00 via INTRAVENOUS

## 2014-02-14 MED ORDER — INFLUENZA VAC SPLIT QUAD 0.5 ML IM SUSY
0.5000 mL | PREFILLED_SYRINGE | INTRAMUSCULAR | Status: DC
  Filled 2014-02-14: qty 0.5

## 2014-02-14 MED ORDER — TIZANIDINE HCL 4 MG PO TABS: 4.0000 mg | ORAL_TABLET | Freq: Two times a day (BID) | ORAL | Status: DC | PRN

## 2014-02-14 MED ORDER — HEPARIN SODIUM (PORCINE) 5000 UNIT/ML IJ SOLN
5000.0000 [IU] | Freq: Three times a day (TID) | INTRAMUSCULAR | Status: DC
  Filled 2014-02-14: qty 1

## 2014-02-14 MED ORDER — ONDANSETRON HCL 4 MG PO TABS
4.0000 mg | ORAL_TABLET | Freq: Four times a day (QID) | ORAL | Status: DC | PRN
Start: 2014-02-14 — End: 2014-02-15

## 2014-02-14 MED ORDER — GABAPENTIN 600 MG PO TABS
1200.0000 mg | ORAL_TABLET | Freq: Three times a day (TID) | ORAL | Status: DC
  Filled 2014-02-14 (×5): qty 2

## 2014-02-14 MED ORDER — INSULIN ASPART 100 UNIT/ML ~~LOC~~ SOLN
0.0000 [IU] | SUBCUTANEOUS | Status: DC
  Administered 2014-02-14: 3 [IU] via SUBCUTANEOUS

## 2014-02-14 MED ORDER — LISINOPRIL-HYDROCHLOROTHIAZIDE 20-12.5 MG PO TABS: 1.0000 | ORAL_TABLET | Freq: Two times a day (BID) | ORAL | Status: DC

## 2014-02-14 MED ORDER — ASPIRIN EC 325 MG PO TBEC
325.0000 mg | DELAYED_RELEASE_TABLET | Freq: Every day | ORAL | Status: DC
  Administered 2014-02-14: 325 mg via ORAL
  Filled 2014-02-14: qty 1

## 2014-02-14 MED ORDER — ASPIRIN 81 MG PO CHEW
324.0000 mg | CHEWABLE_TABLET | Freq: Once | ORAL | Status: AC
  Administered 2014-02-14: 324 mg via ORAL
  Filled 2014-02-14: qty 4

## 2014-02-14 MED ORDER — SODIUM CHLORIDE 0.9 % IJ SOLN: 3.0000 mL | Freq: Two times a day (BID) | INTRAMUSCULAR | Status: DC

## 2014-02-14 MED ORDER — AMLODIPINE BESYLATE 5 MG PO TABS
10.0000 mg | ORAL_TABLET | Freq: Every day | ORAL | Status: DC
  Administered 2014-02-14: 10 mg via ORAL
  Filled 2014-02-14: qty 2

## 2014-02-14 MED ORDER — INSULIN ASPART PROT & ASPART (70-30 MIX) 100 UNIT/ML ~~LOC~~ SUSP
70.0000 [IU] | Freq: Two times a day (BID) | SUBCUTANEOUS | Status: DC
  Filled 2014-02-14: qty 10

## 2014-02-14 MED ORDER — ONDANSETRON HCL 4 MG/2ML IJ SOLN: 4.0000 mg | Freq: Four times a day (QID) | INTRAMUSCULAR | Status: DC | PRN

## 2014-02-14 MED ORDER — PNEUMOCOCCAL VAC POLYVALENT 25 MCG/0.5ML IJ INJ
0.5000 mL | INJECTION | INTRAMUSCULAR | Status: DC
  Filled 2014-02-14: qty 0.5

## 2014-02-14 MED ORDER — OXYCODONE HCL ER 20 MG PO T12A
20.0000 mg | EXTENDED_RELEASE_TABLET | Freq: Two times a day (BID) | ORAL | Status: DC
  Administered 2014-02-14: 20 mg via ORAL
  Filled 2014-02-14: qty 1

## 2014-02-14 MED ORDER — HYDROCHLOROTHIAZIDE 12.5 MG PO CAPS: 12.5000 mg | ORAL_CAPSULE | Freq: Two times a day (BID) | ORAL | Status: DC

## 2014-02-14 MED ORDER — ONDANSETRON HCL 4 MG/2ML IJ SOLN
4.0000 mg | Freq: Once | INTRAMUSCULAR | Status: AC
  Administered 2014-02-14: 4 mg via INTRAVENOUS
  Filled 2014-02-14: qty 2

## 2014-02-14 MED ORDER — ATENOLOL 25 MG PO TABS
100.0000 mg | ORAL_TABLET | Freq: Every day | ORAL | Status: DC
  Administered 2014-02-14: 100 mg via ORAL
  Filled 2014-02-14: qty 4

## 2014-02-14 MED ORDER — OXYCODONE-ACETAMINOPHEN 5-325 MG PO TABS: 1.0000 | ORAL_TABLET | Freq: Four times a day (QID) | ORAL | Status: DC | PRN

## 2014-02-14 MED ORDER — LISINOPRIL 10 MG PO TABS: 20.0000 mg | ORAL_TABLET | Freq: Two times a day (BID) | ORAL | Status: DC

## 2014-02-14 MED ORDER — GABAPENTIN 300 MG PO CAPS
ORAL_CAPSULE | ORAL | Status: AC
  Filled 2014-02-14: qty 4

## 2014-02-14 NOTE — H&P (Addendum)
Hospitalist Admission History and Physical  Patient name: [name] Medical record number: [id number] Date of birth: Jan 04, 1951 Age: 63 y.o. Gender: male  Primary Care Provider: No primary provider on file. ( ? [name])  Chief Complaint: chest pain  History of Present Illness:This is a 63 y.o. year old male with significant past medical history of HTN, IDDM, tobacco abuse, PVD, chronic pain presenting with chest pain. Patient has multiple complaints including nausea, diaphoresis, radiating chest pressure. Symptoms initially started last night with 1-2 episodes of nausea and diaphoresis that woke patient up. This seemed to resolve per patient. Patient then went on to have one 2 episodes of central chest pressure this evening with radiation down the left arm with associated nausea. Had one episode of emesis and weakness today. Patient reports that his sugars have been well under control. Still smoking. Patient presents to the ER MAXIMUM TEMPERATURE 98.4, heart rate in the 70s, blood pressure in the 110s to 120s, satting greater than 93% room air. Troponin negative 1. EKG within normal limits per EDP (formal image pending). Chest x-ray within normal limits. CBC C May within normal limits apart from hemoglobin of 12.5, glucose of 181. No active chest pain currently. Was given full dose aspirin on arrival. Patient is noted had a normal cardiac catheterization back in September 2012. This showed nonobstructive disease with recommendation for medical management. States that symptoms today are somewhat different from previous. No worsening in intensity.  Assessment and Plan: [name] is a 63 y.o. year old male presenting with Chest Pain   Active Problems:   Chest pain   1- Chest Pain  -fairly typical sxs -noted cardiac cath 12/2010 with nonobstructive disease -cycle CEs -risk stratification labs -prn NTG -tele bed -consider cards consult in am   2- HTN -BP  stable  -cont home regimen  3- DM  -SSI+ 70/30  -A1C  4- Chronic Pain  -cont home regimen   5- tobacco abuse -pt not ready to quit   FEN/GI: heart healthy, carb modified diet  Prophylaxis: sub q heparin  Disposition: pending further evaluation. Pt noted to be apprehensive about admission citing diets and medications. " I want my grilled fish"- "I want my pain medication"- Discussed with pt that meds will be ordered as originally prescribed oupatient and importance of ruling out of cardiac source of sxs. Pt expressed understanding and is agreeable to admission.  Code Status:Full Code    Patient Active Problem List   Diagnosis Date Noted  . Chest pain 02/14/2014  . Coronary atherosclerosis of native coronary artery 02/13/2011  . Essential hypertension, benign 02/13/2011  . Tobacco abuse 02/13/2011  . Peripheral arterial disease 02/13/2011  . Mixed hyperlipidemia 02/13/2011   Past Medical History: Past Medical History  Diagnosis Date  . Essential hypertension, benign   . Type 2 diabetes mellitus   . Mixed hyperlipidemia   . OSA (obstructive sleep apnea)   . Partial small bowel obstruction   . Stevens-Johnson syndrome     Vancomycin  . Coronary atherosclerosis of native coronary artery     Nonobstructive  . Erythrocytosis   . Peripheral arterial disease     70% right iliac  . History of MRSA infection     Past Surgical History: Past Surgical History  Procedure Laterality Date  . Appendectomy    . Neck surgery    . Back surgery    . Hemorrhoid surgery      Social History: History   Social History  .  Marital Status: Divorced    Spouse Name: N/A    Number of Children: 7  . Years of Education: N/A   Occupational History  . UNEMPLOYED    Social History Main Topics  . Smoking status: Current Every Day Smoker -- 1.00 packs/day for 40 years    Types: Cigarettes  . Smokeless tobacco: Never Used  . Alcohol Use: No  . Drug Use: No  . Sexual Activity: None    Other Topics Concern  . None   Social History Narrative  . None    Family History: Family History  Problem Relation Age of Onset  . Coronary artery disease    . Hypertension      Allergies: Allergies  Allergen Reactions  . Vancomycin     Caused Stevens-Johnsons syndrome    Current Facility-Administered Medications  Medication Dose Route Frequency Provider Last Rate Last Dose  . 0.9 %  sodium chloride infusion   Intravenous Continuous Shanda Howells, MD      . amLODipine (NORVASC) tablet 10 mg  10 mg Oral Daily Shanda Howells, MD      . aspirin EC tablet 325 mg  325 mg Oral Daily Shanda Howells, MD      . atenolol (TENORMIN) tablet 100 mg  100 mg Oral Daily Shanda Howells, MD      . gabapentin (NEURONTIN) tablet 1,200 mg  1,200 mg Oral TID Shanda Howells, MD      . heparin injection 5,000 Units  5,000 Units Subcutaneous 3 times per day Shanda Howells, MD      . insulin aspart (novoLOG) injection 0-9 Units  0-9 Units Subcutaneous 6 times per day Shanda Howells, MD      . Derrill Memo ON 02/15/2014] insulin aspart protamine- aspart (NOVOLOG MIX 70/30) injection 70 Units  70 Units Subcutaneous BID WC Shanda Howells, MD      . lisinopril-hydrochlorothiazide (PRINZIDE,ZESTORETIC) 20-12.5 MG per tablet 1 tablet  1 tablet Oral BID Shanda Howells, MD      . ondansetron [facility]) tablet 4 mg  4 mg Oral Q6H PRN Shanda Howells, MD       Or  . ondansetron [facility]) injection 4 mg  4 mg Intravenous Q6H PRN Shanda Howells, MD      . OxyCODONE (OXYCONTIN) 12 hr tablet 20 mg  20 mg Oral BID Shanda Howells, MD      . oxyCODONE-acetaminophen (PERCOCET/ROXICET) 5-325 MG per tablet 1 tablet  1 tablet Oral Q6H PRN Shanda Howells, MD      . sodium chloride 0.9 % injection 3 mL  3 mL Intravenous Q12H Shanda Howells, MD      . tiZANidine (ZANAFLEX) tablet 4 mg  4 mg Oral BID PRN Shanda Howells, MD       Current Outpatient Prescriptions  Medication Sig Dispense Refill  . amLODipine (NORVASC) 10 MG tablet Take 10 mg by  mouth daily.      Marland Kitchen atenolol (TENORMIN) 100 MG tablet Take 100 mg by mouth daily.       Marland Kitchen gabapentin (NEURONTIN) 600 MG tablet Take 1,200 mg by mouth 3 (three) times daily.       . insulin aspart (NOVOLOG) 100 UNIT/ML injection Inject 0-10 Units into the skin See admin instructions. SLIDING SCALE:  0-150 = 0 units 151-200= 2 units 201-250= 4 units 251-300= 6 units 301-350= 8 units 351-400= 10 units OVER 400= CALL MD ---Maximum dose of 60 units twice daily      . insulin NPH-insulin regular (NOVOLIN 70/30) (70-30) 100 UNIT/ML  injection Inject 70 Units into the skin 2 (two) times daily.       Marland Kitchen lisinopril-hydrochlorothiazide (PRINZIDE,ZESTORETIC) 20-12.5 MG per tablet Take 1 tablet by mouth 2 (two) times daily.        . metFORMIN (GLUCOPHAGE) 1000 MG tablet Take 1,000 mg by mouth 2 (two) times daily with a meal.        . OxyCODONE (OXYCONTIN) 20 mg T12A 12 hr tablet Take 20 mg by mouth 2 (two) times daily.      Marland Kitchen oxyCODONE-acetaminophen (PERCOCET/ROXICET) 5-325 MG per tablet Take 1 tablet by mouth every 6 (six) hours as needed for moderate pain or severe pain.      Marland Kitchen tiZANidine (ZANAFLEX) 4 MG tablet Take 4 mg by mouth 2 (two) times daily as needed for muscle spasms.       Review Of Systems: 12 point ROS negative except as noted above in HPI.  Physical Exam: Filed Vitals:   02/14/14 1804  BP: 117/72  Pulse: 72  Temp:   Resp: 20    General: alert, cooperative and moderately obese HEENT: PERRLA and extra ocular movement intact Heart: S1, S2 normal, no murmur, rub or gallop, regular rate and rhythm Lungs: clear to auscultation, no wheezes or rales and unlabored breathing Abdomen: abdomen is soft without significant tenderness, masses, organomegaly or guarding Extremities: extremities normal, atraumatic, no cyanosis or edema Skin:no rashes, no ecchymoses Neurology: normal without focal findings  Labs and Imaging: Lab Results  Component Value Date/Time   NA 135* 02/14/2014  5:13 PM    K 4.3 02/14/2014  5:13 PM   CL 96 02/14/2014  5:13 PM   CO2 24 02/14/2014  5:13 PM   BUN 27* 02/14/2014  5:13 PM   CREATININE 1.01 02/14/2014  5:13 PM   GLUCOSE 181* 02/14/2014  5:13 PM   Lab Results  Component Value Date   WBC 8.9 02/14/2014   HGB 12.5* 02/14/2014   HCT 37.4* 02/14/2014   MCV 87.8 02/14/2014   PLT 151 02/14/2014    Dg Chest 2 View  02/14/2014   CLINICAL DATA:  Medial and left side chest pain radiating in the left upper extremity and left neck for 2 hr. History of hypertension, diabetes, smoker.  EXAM: CHEST  2 VIEW  COMPARISON:  12/20/2010  FINDINGS: There is hyperinflation of the lungs compatible with COPD. Heart and mediastinal contours are within normal limits. No focal opacities or effusions. No acute bony abnormality.  IMPRESSION: COPD.  No active disease.   Electronically Signed   By: Rolm Baptise M.D.   On: 02/14/2014 17:13           Shanda Howells MD  Pager: [phone]

## 2014-02-14 NOTE — ED Provider Notes (Signed)
CSN: [id number]     Arrival date & time 02/14/14  1648 History   First MD Initiated Contact with Patient 02/14/14 1822     Chief Complaint  Patient presents with  . Chest Pain     (Consider location/radiation/quality/duration/timing/severity/associated sxs/prior Treatment) Patient is a 63 y.o. male presenting with chest pain. The history is provided by the patient.  Chest Pain Associated symptoms: back pain, diaphoresis and shortness of breath   Associated symptoms: no abdominal pain, no fever, no nausea and not vomiting    patient was significant cardiac risk factors to include hypertension diabetes and family history of MI before age 28.  The patient for the past 2 days of having episodic episodes of nausea and diaphoresis. Today at around 1:30 developed left substernal chest pain left-sided chest pain non-radiating to the back that lasted for about an hour. Associated with nausea and diaphoresis. Patient had some shortness of breath but he thinks that may be baseline for him. But the nausea is not. Since that time patient's had recurrent episodes of this chest discomfort lasting 10-15 minutes. Patient denies being followed by cardiology. Patient does not have a regular primary care doctor uses a clinic in [place].  Past Medical History  Diagnosis Date  . Essential hypertension, benign   . Type 2 diabetes mellitus   . Mixed hyperlipidemia   . OSA (obstructive sleep apnea)   . Partial small bowel obstruction   . Stevens-Johnson syndrome     Vancomycin  . Coronary atherosclerosis of native coronary artery     Nonobstructive  . Erythrocytosis   . Peripheral arterial disease     70% right iliac  . History of MRSA infection    Past Surgical History  Procedure Laterality Date  . Appendectomy    . Neck surgery    . Back surgery    . Hemorrhoid surgery     Family History  Problem Relation Age of Onset  . Coronary artery disease    . Hypertension     History  Substance Use  Topics  . Smoking status: Current Every Day Smoker -- 1.00 packs/day for 40 years    Types: Cigarettes  . Smokeless tobacco: Never Used  . Alcohol Use: No    Review of Systems  Constitutional: Positive for diaphoresis. Negative for fever.  HENT: Negative for congestion.   Eyes: Negative for visual disturbance.  Respiratory: Positive for shortness of breath.   Cardiovascular: Positive for chest pain.  Gastrointestinal: Negative for nausea, vomiting, abdominal pain and diarrhea.  Genitourinary: Negative for dysuria.  Musculoskeletal: Positive for back pain and neck pain.  Skin: Negative for rash.  Neurological: Positive for light-headedness.  Hematological: Does not bruise/bleed easily.  Psychiatric/Behavioral: Negative for confusion.      Allergies  Vancomycin  Home Medications   Prior to Admission medications   Medication Sig Start Date End Date Taking? Authorizing Provider  amLODipine (NORVASC) 10 MG tablet Take 10 mg by mouth daily.   Yes Historical Provider, MD  atenolol (TENORMIN) 100 MG tablet Take 100 mg by mouth daily.    Yes Historical Provider, MD  gabapentin (NEURONTIN) 600 MG tablet Take 1,200 mg by mouth 3 (three) times daily.    Yes Historical Provider, MD  insulin aspart (NOVOLOG) 100 UNIT/ML injection Inject 0-10 Units into the skin See admin instructions. SLIDING SCALE:  0-150 = 0 units 151-200= 2 units 201-250= 4 units 251-300= 6 units 301-350= 8 units 351-400= 10 units OVER 400= CALL MD ---Maximum dose of  60 units twice daily   Yes Historical Provider, MD  insulin NPH-insulin regular (NOVOLIN 70/30) (70-30) 100 UNIT/ML injection Inject 70 Units into the skin 2 (two) times daily.    Yes Historical Provider, MD  lisinopril-hydrochlorothiazide (PRINZIDE,ZESTORETIC) 20-12.5 MG per tablet Take 1 tablet by mouth 2 (two) times daily.     Yes Historical Provider, MD  metFORMIN (GLUCOPHAGE) 1000 MG tablet Take 1,000 mg by mouth 2 (two) times daily with a meal.      Yes Historical Provider, MD  OxyCODONE (OXYCONTIN) 20 mg T12A 12 hr tablet Take 20 mg by mouth 2 (two) times daily.   Yes Historical Provider, MD  oxyCODONE-acetaminophen (PERCOCET/ROXICET) 5-325 MG per tablet Take 1 tablet by mouth every 6 (six) hours as needed for moderate pain or severe pain.   Yes Historical Provider, MD  tiZANidine (ZANAFLEX) 4 MG tablet Take 4 mg by mouth 2 (two) times daily as needed for muscle spasms.   Yes Historical Provider, MD   BP 117/72  Pulse 72  Temp(Src) 98.4 F (36.9 C) (Oral)  Resp 20  SpO2 93% Physical Exam  Nursing note and vitals reviewed. Constitutional: He is oriented to person, place, and time. He appears well-developed and well-nourished. No distress.  HENT:  Head: Normocephalic and atraumatic.  Mouth/Throat: Oropharynx is clear and moist.  Eyes: Conjunctivae and EOM are normal. Pupils are equal, round, and reactive to light.  Neck: Normal range of motion.  Cardiovascular: Normal rate, regular rhythm and normal heart sounds.   Pulmonary/Chest: Effort normal and breath sounds normal. No respiratory distress.  Abdominal: Soft. Bowel sounds are normal. There is no tenderness.  Musculoskeletal: Normal range of motion.  Neurological: He is alert and oriented to person, place, and time. No cranial nerve deficit. He exhibits normal muscle tone. Coordination normal.  Skin: Skin is warm. No rash noted.    ED Course  Procedures (including critical care time) Labs Review Labs Reviewed  CBC WITH DIFFERENTIAL - Abnormal; Notable for the following:    Hemoglobin 12.5 (*)    HCT 37.4 (*)    All other components within normal limits  COMPREHENSIVE METABOLIC PANEL - Abnormal; Notable for the following:    Sodium 135 (*)    Glucose, Bld 181 (*)    BUN 27 (*)    GFR calc non Af Amer 78 (*)    All other components within normal limits  TROPONIN I   Results for orders placed during the hospital encounter of 02/14/14  CBC WITH DIFFERENTIAL      Result  Value Ref Range   WBC 8.9  4.0 - 10.5 K/uL   RBC 4.26  4.22 - 5.81 MIL/uL   Hemoglobin 12.5 (*) 13.0 - 17.0 g/dL   HCT 37.4 (*) 39.0 - 52.0 %   MCV 87.8  78.0 - 100.0 fL   MCH 29.3  26.0 - 34.0 pg   MCHC 33.4  30.0 - 36.0 g/dL   RDW 14.6  11.5 - 15.5 %   Platelets 151  150 - 400 K/uL   Neutrophils Relative % 69  43 - 77 %   Neutro Abs 6.2  1.7 - 7.7 K/uL   Lymphocytes Relative 18  12 - 46 %   Lymphs Abs 1.6  0.7 - 4.0 K/uL   Monocytes Relative 10  3 - 12 %   Monocytes Absolute 0.9  0.1 - 1.0 K/uL   Eosinophils Relative 3  0 - 5 %   Eosinophils Absolute 0.2  0.0 -  0.7 K/uL   Basophils Relative 0  0 - 1 %   Basophils Absolute 0.0  0.0 - 0.1 K/uL  COMPREHENSIVE METABOLIC PANEL      Result Value Ref Range   Sodium 135 (*) 137 - 147 mEq/L   Potassium 4.3  3.7 - 5.3 mEq/L   Chloride 96  96 - 112 mEq/L   CO2 24  19 - 32 mEq/L   Glucose, Bld 181 (*) 70 - 99 mg/dL   BUN 27 (*) 6 - 23 mg/dL   Creatinine, Ser 1.01  0.50 - 1.35 mg/dL   Calcium 9.6  8.4 - 10.5 mg/dL   Total Protein 8.0  6.0 - 8.3 g/dL   Albumin 4.3  3.5 - 5.2 g/dL   AST 30  0 - 37 U/L   ALT 28  0 - 53 U/L   Alkaline Phosphatase 98  39 - 117 U/L   Total Bilirubin 0.3  0.3 - 1.2 mg/dL   GFR calc non Af Amer 78 (*) >90 mL/min   GFR calc Af Amer >90  >90 mL/min   Anion gap 15  5 - 15  TROPONIN I      Result Value Ref Range   Troponin I <0.30  <0.30 ng/mL     Imaging Review Dg Chest 2 View  02/14/2014   CLINICAL DATA:  Medial and left side chest pain radiating in the left upper extremity and left neck for 2 hr. History of hypertension, diabetes, smoker.  EXAM: CHEST  2 VIEW  COMPARISON:  12/20/2010  FINDINGS: There is hyperinflation of the lungs compatible with COPD. Heart and mediastinal contours are within normal limits. No focal opacities or effusions. No acute bony abnormality.  IMPRESSION: COPD.  No active disease.   Electronically Signed   By: Rolm Baptise M.D.   On: 02/14/2014 17:13     EKG  Interpretation None     Results for orders placed during the hospital encounter of 02/14/14  CBC WITH DIFFERENTIAL      Result Value Ref Range   WBC 8.9  4.0 - 10.5 K/uL   RBC 4.26  4.22 - 5.81 MIL/uL   Hemoglobin 12.5 (*) 13.0 - 17.0 g/dL   HCT 37.4 (*) 39.0 - 52.0 %   MCV 87.8  78.0 - 100.0 fL   MCH 29.3  26.0 - 34.0 pg   MCHC 33.4  30.0 - 36.0 g/dL   RDW 14.6  11.5 - 15.5 %   Platelets 151  150 - 400 K/uL   Neutrophils Relative % 69  43 - 77 %   Neutro Abs 6.2  1.7 - 7.7 K/uL   Lymphocytes Relative 18  12 - 46 %   Lymphs Abs 1.6  0.7 - 4.0 K/uL   Monocytes Relative 10  3 - 12 %   Monocytes Absolute 0.9  0.1 - 1.0 K/uL   Eosinophils Relative 3  0 - 5 %   Eosinophils Absolute 0.2  0.0 - 0.7 K/uL   Basophils Relative 0  0 - 1 %   Basophils Absolute 0.0  0.0 - 0.1 K/uL  COMPREHENSIVE METABOLIC PANEL      Result Value Ref Range   Sodium 135 (*) 137 - 147 mEq/L   Potassium 4.3  3.7 - 5.3 mEq/L   Chloride 96  96 - 112 mEq/L   CO2 24  19 - 32 mEq/L   Glucose, Bld 181 (*) 70 - 99 mg/dL   BUN 27 (*) 6 -  23 mg/dL   Creatinine, Ser 1.01  0.50 - 1.35 mg/dL   Calcium 9.6  8.4 - 10.5 mg/dL   Total Protein 8.0  6.0 - 8.3 g/dL   Albumin 4.3  3.5 - 5.2 g/dL   AST 30  0 - 37 U/L   ALT 28  0 - 53 U/L   Alkaline Phosphatase 98  39 - 117 U/L   Total Bilirubin 0.3  0.3 - 1.2 mg/dL   GFR calc non Af Amer 78 (*) >90 mL/min   GFR calc Af Amer >90  >90 mL/min   Anion gap 15  5 - 15  TROPONIN I      Result Value Ref Range   Troponin I <0.30  <0.30 ng/mL     Date: 02/14/2014  Rate: 73  Rhythm: normal sinus rhythm  QRS Axis: normal  Intervals: normal  ST/T Wave abnormalities: normal  Conduction Disutrbances:first-degree A-V block   Narrative Interpretation:   Old EKG Reviewed: none available      MDM   Final diagnoses:  Other chest pain    Patient with significant coronary artery disease risk factors. Include type 2 diabetes hypertension and his father had the MI at age 31  that resulted in death. Patient with unusual symptoms the past few days starting 2 days ago was having diaphoresis and nausea. Today started to have chest discomfort left substernal into left chest. Not associated with shortness of breath is associated with nausea. Patient has some baseline shortness of breath but nothing worse. First episode was around 1:30 in the afternoon and lasted for about an hour. Then resolved since then he's had episodes lasting 10-15 minutes.  Patient did not relay any past history of seeing a cardiologist but in 2012 he was followed by [facility] cardiology. Also had a cardiac cath done at that time without any significant coronary artery disease. However the symptoms are very unusual and different. Discussed with the hospitalist they will see the patient and perhaps consider for admission for cardiac rule out. Patient's lab workup here EKG without acute changes first troponin negative. Electrolytes without significant abnormalities. Chest x-ray negative for pneumonia or pneumothorax or pulmonary edema.  All of the episodes of been associated with diaphoresis and nausea. Even had the those symptoms present prior to the onset of the chest discomfort today. He stated started 2 days ago. Patient treated here with aspirin and antinausea medicine.    Fredia Sorrow, MD 02/14/14 1958

## 2014-02-14 NOTE — Progress Notes (Signed)
Patient stating that he's not taking medications until pain medication is fixed.  Patient normally take 40 mg of oxy contin BID and was only ordered 20 mg.  Patient also stating that he uses C pap at night and that he told the doctor that in the emergency room.  Attempted to page the MD to fulfill patients request when patient started cursing his wife and staff.  Patient also stumbling around the room and refusing to sit down.  Patient stating that he was leaving as soon as he was brought to the floor.  Patient signed AMA papers after the consequences were explained to him.  Patient still refuses to stay.  AMA papers signed and patient exited the building with his wife.  On call MD will be notified.

## 2014-02-14 NOTE — ED Notes (Signed)
Pt states last night felt like he was getting flu, today started having chest pain

## 2014-02-14 NOTE — ED Notes (Signed)
Sudden onset of chest pain, sweats, nausea.

## 2014-02-15 LAB — GLUCOSE, CAPILLARY: Glucose-Capillary: 233 mg/dL — ABNORMAL HIGH (ref 70–99)

## 2014-02-15 LAB — HEMOGLOBIN A1C
Hgb A1c MFr Bld: 7.5 % — ABNORMAL HIGH (ref <5.7)
MEAN PLASMA GLUCOSE: 169 mg/dL — AB (ref <117)

## 2014-02-15 LAB — TSH: TSH: 3.35 u[IU]/mL (ref 0.350–4.500)

## 2014-02-15 NOTE — Care Management Utilization Note (Signed)
UR completed 

## 2014-12-28 ENCOUNTER — Other Ambulatory Visit (HOSPITAL_COMMUNITY): Payer: Self-pay | Admitting: Podiatry

## 2014-12-28 DIAGNOSIS — R0989 Other specified symptoms and signs involving the circulatory and respiratory systems: Secondary | ICD-10-CM

## 2015-01-04 ENCOUNTER — Ambulatory Visit (HOSPITAL_COMMUNITY)
Admission: RE | Admit: 2015-01-04 | Discharge: 2015-01-04 | Disposition: A | Discharge status: Home / Self Care | Payer: Medicare Other | Source: Ambulatory Visit | Attending: Podiatry | Admitting: Podiatry

## 2015-01-04 DIAGNOSIS — E119 Type 2 diabetes mellitus without complications: Secondary | ICD-10-CM | POA: Insufficient documentation

## 2015-01-04 DIAGNOSIS — R0989 Other specified symptoms and signs involving the circulatory and respiratory systems: Secondary | ICD-10-CM

## 2015-01-04 DIAGNOSIS — M79605 Pain in left leg: Secondary | ICD-10-CM | POA: Diagnosis not present

## 2015-01-04 DIAGNOSIS — I10 Essential (primary) hypertension: Secondary | ICD-10-CM | POA: Diagnosis not present

## 2015-01-04 DIAGNOSIS — M79604 Pain in right leg: Secondary | ICD-10-CM | POA: Insufficient documentation

## 2015-01-04 DIAGNOSIS — M79606 Pain in leg, unspecified: Secondary | ICD-10-CM | POA: Diagnosis present

## 2015-01-04 DIAGNOSIS — I739 Peripheral vascular disease, unspecified: Secondary | ICD-10-CM | POA: Diagnosis not present

## 2015-01-08 ENCOUNTER — Ambulatory Visit (HOSPITAL_COMMUNITY): Payer: Medicare Other

## 2015-02-27 ENCOUNTER — Encounter: Payer: Self-pay | Admitting: Family Medicine

## 2015-02-27 ENCOUNTER — Ambulatory Visit (INDEPENDENT_AMBULATORY_CARE_PROVIDER_SITE_OTHER): Payer: Medicare Other | Admitting: Family Medicine

## 2015-02-27 VITALS — BP 134/69 | HR 65 | Temp 98.7°F | Ht 71.0 in | Wt 210.0 lb

## 2015-02-27 DIAGNOSIS — E119 Type 2 diabetes mellitus without complications: Secondary | ICD-10-CM | POA: Diagnosis not present

## 2015-02-27 DIAGNOSIS — Z720 Tobacco use: Secondary | ICD-10-CM

## 2015-02-27 DIAGNOSIS — Z794 Long term (current) use of insulin: Secondary | ICD-10-CM | POA: Diagnosis not present

## 2015-02-27 DIAGNOSIS — Z23 Encounter for immunization: Secondary | ICD-10-CM | POA: Diagnosis not present

## 2015-02-27 DIAGNOSIS — Z716 Tobacco abuse counseling: Secondary | ICD-10-CM

## 2015-02-27 LAB — POCT GLYCOSYLATED HEMOGLOBIN (HGB A1C): Hemoglobin A1C: 7.4

## 2015-02-27 MED ORDER — PEN NEEDLES 31G X 6 MM MISC: 1.0000 | Freq: Four times a day (QID) | Status: DC

## 2015-02-27 MED ORDER — SITAGLIPTIN PHOSPHATE 100 MG PO TABS: 100.0000 mg | ORAL_TABLET | Freq: Every day | ORAL | Status: DC

## 2015-02-27 MED ORDER — INSULIN ASPART 100 UNIT/ML FLEXPEN: 10.0000 [IU] | PEN_INJECTOR | Freq: Three times a day (TID) | SUBCUTANEOUS | Status: DC

## 2015-02-27 MED ORDER — INSULIN GLARGINE 100 UNIT/ML SOLOSTAR PEN: 50.0000 [IU] | PEN_INJECTOR | Freq: Every day | SUBCUTANEOUS | Status: DC

## 2015-02-27 MED ORDER — BUPROPION HCL ER (XL) 150 MG PO TB24: 150.0000 mg | ORAL_TABLET | Freq: Every day | ORAL | Status: DC

## 2015-02-27 NOTE — Progress Notes (Signed)
BP 134/69 mmHg  Pulse 65  Temp(Src) 98.7 F (37.1 C) (Oral)  Ht 5' 11"  (1.803 m)  Wt 210 lb (95.255 kg)  BMI 29.30 kg/m2   Subjective:    Patient ID: [name], male    DOB: November 11, 1950, 64 y.o.   MRN: [id number]  HPI: [name] is a 64 y.o. male presenting on 02/27/2015 for Establish Care   HPI Diabetes Patient has had type II but diabetes for many years. He has been seeing a clinic over in [place] that was free and her cervix clinic because he did not have insurance. He now has insurance and is coming here today and would like to get on top of his diabetes and get some dietary change ideas and overall get his diabetes doing better. He is currently taking 100 units twice a day of Novolin 70/30 and NovoLog sliding scale on top of that he believes he uses between 5 and 20 units of NovoLog daily but some days uses none. He was also taking metformin and glipizide. His hemoglobin A1c is 7.4. 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 It is been about a year and a half since he seen an ophthalmologist but he denies any changes in his eyes from diabetes. He also is taking lisinopril hydrochlorothiazide combo for renal protection and blood pressure. He denies any chest pain, shortness of breath, foot breakdown or ulcerations.  Relevant past medical, surgical, family and social history reviewed and updated as indicated. Interim medical history since our last visit reviewed. Allergies and medications reviewed and updated.  Review of Systems  Constitutional: Negative for fever.  HENT: Negative for congestion, ear discharge and ear pain.   Eyes: Negative for discharge and visual disturbance.  Respiratory: Negative for shortness of breath and wheezing.   Cardiovascular: Negative for chest pain and leg swelling.  Gastrointestinal: Negative for abdominal pain, diarrhea and constipation.  Endocrine: Positive for polyuria. Negative for cold  intolerance, heat intolerance and polydipsia.  Genitourinary: Negative for difficulty urinating.  Musculoskeletal: Negative for back pain and gait problem.  Skin: Negative for rash.  Neurological: Positive for numbness (and tingling in his feet). Negative for syncope, light-headedness and headaches.  All other systems reviewed and are negative.   Per HPI unless specifically indicated above  Social History   Social History  . Marital Status: Divorced    Spouse Name: N/A  . Number of Children: 7  . Years of Education: N/A   Occupational History  . UNEMPLOYED    Social History Main Topics  . Smoking status: Current Every Day Smoker -- 1.00 packs/day for 40 years    Types: Cigarettes  . Smokeless tobacco: Never Used  . Alcohol Use: No  . Drug Use: No  . Sexual Activity: Not on file   Other Topics Concern  . Not on file   Social History Narrative    Past Surgical History  Procedure Laterality Date  . Appendectomy    . Neck surgery    . Back surgery    . Hemorrhoid surgery    . Vasectomy      Family History  Problem Relation Age of Onset  . Coronary artery disease    . Hypertension    . Cancer Mother   . Depression Mother   . Diabetes Mother   . Heart disease Maternal Grandfather   . Hyperlipidemia Maternal Grandfather   . Hypertension Maternal Grandfather  Medication List       This list is accurate as of: 02/27/15  9:55 AM.  Always use your most recent med list.               amLODipine 10 MG tablet  Commonly known as:  NORVASC  Take 10 mg by mouth daily.     atenolol 100 MG tablet  Commonly known as:  TENORMIN  Take 100 mg by mouth daily.     buPROPion 150 MG 24 hr tablet  Commonly known as:  WELLBUTRIN XL  Take 1 tablet (150 mg total) by mouth daily.     gabapentin 600 MG tablet  Commonly known as:  NEURONTIN  Take 1,200 mg by mouth 3 (three) times daily.     gemfibrozil 600 MG tablet  Commonly known as:  LOPID  Take 600 mg by  mouth.     insulin aspart 100 UNIT/ML FlexPen  Commonly known as:  NOVOLOG FLEXPEN  Inject 10 Units into the skin 3 (three) times daily with meals.     Insulin Glargine 100 UNIT/ML Solostar Pen  Commonly known as:  LANTUS SOLOSTAR  Inject 50 Units into the skin daily at 10 pm.     lisinopril-hydrochlorothiazide 20-12.5 MG tablet  Commonly known as:  PRINZIDE,ZESTORETIC  Take by mouth 2 (two) times daily.     metFORMIN 1000 MG tablet  Commonly known as:  GLUCOPHAGE  Take 1,000 mg by mouth.     omeprazole 40 MG capsule  Commonly known as:  PRILOSEC  Take 20 mg by mouth 2 (two) times daily.     oxyCODONE-acetaminophen 5-325 MG tablet  Commonly known as:  PERCOCET/ROXICET  Take by mouth.     OXYCONTIN 30 MG 12 hr tablet  Generic drug:  oxyCODONE  Take 30 mg by mouth.     Pen Needles 31G X 6 MM Misc  1 each by Does not apply route 4 (four) times daily.     sitaGLIPtin 100 MG tablet  Commonly known as:  JANUVIA  Take 1 tablet (100 mg total) by mouth daily.     tiZANidine 4 MG tablet  Commonly known as:  ZANAFLEX  Take 4 mg by mouth 2 (two) times daily as needed for muscle spasms.           Objective:    BP 134/69 mmHg  Pulse 65  Temp(Src) 98.7 F (37.1 C) (Oral)  Ht 5' 11"  (1.803 m)  Wt 210 lb (95.255 kg)  BMI 29.30 kg/m2  Wt Readings from Last 3 Encounters:  02/27/15 210 lb (95.255 kg)  02/14/14 212 lb 8.4 oz (96.4 kg)  02/13/11 197 lb (89.359 kg)    Physical Exam  Constitutional: He is oriented to person, place, and time. He appears well-developed and well-nourished. No distress.  Eyes: Conjunctivae and EOM are normal. Pupils are equal, round, and reactive to light. Right eye exhibits no discharge. No scleral icterus.  Neck: Normal range of motion. Neck supple. No thyromegaly present.  Cardiovascular: Normal rate, regular rhythm, normal heart sounds and intact distal pulses.   No murmur heard. Pulmonary/Chest: Effort normal and breath sounds normal. No  respiratory distress. He has no wheezes.  Musculoskeletal: Normal range of motion. He exhibits no edema.  Lymphadenopathy:    He has no cervical adenopathy.  Neurological: He is alert and oriented to person, place, and time. Coordination normal.  Skin: Skin is warm and dry. No rash noted. He is not diaphoretic.  Psychiatric: He has a  normal mood and affect. His behavior is normal.  Vitals reviewed.  Results for orders placed or performed in visit on 02/27/15  POCT glycosylated hemoglobin (Hb A1C)  Result Value Ref Range   Hemoglobin A1C 7.4       Assessment & Plan:   Problem List Items Addressed This Visit      Endocrine   Type 2 diabetes mellitus without complication, with long-term current use of insulin ([place]) - Primary    Patient was on 100 units twice a day of Novolin 70/30 and NovoLog sliding scale and glipizide and metformin. A1c today is 7.4. We will switch to Lantus starting at 50 units at bedtime and NovoLog 10 units with meals. We will keep on metformin but will switch to Januvia instead of glipizide. See back in 2 weeks and keep doing checks.      Relevant Medications   metFORMIN (GLUCOPHAGE) 1000 MG tablet   lisinopril-hydrochlorothiazide (PRINZIDE,ZESTORETIC) 20-12.5 MG tablet   Insulin Glargine (LANTUS SOLOSTAR) 100 UNIT/ML Solostar Pen   sitaGLIPtin (JANUVIA) 100 MG tablet   Insulin Pen Needle (PEN NEEDLES) 31G X 6 MM MISC   insulin aspart (NOVOLOG FLEXPEN) 100 UNIT/ML FlexPen   Other Relevant Orders   POCT UA - Microalbumin   POCT glycosylated hemoglobin (Hb A1C) (Completed)   CMP14+EGFR   Lipid panel   TSH   POCT UA - Microalbumin   Ambulatory referral to Ophthalmology    Other Visit Diagnoses    Encounter for immunization        Encounter for smoking cessation counseling        Patient ready to quit smoking and would like to try Wellbutrin because that is what his wife is taken.    Relevant Medications    buPROPion (WELLBUTRIN XL) 150 MG 24 hr tablet         Follow up plan: Return in about 2 weeks (around 03/13/2015), or if symptoms worsen or fail to improve, for DM2 recheck, bring meter.  Caryl Pina, MD [facility] 02/27/2015, 9:55 AM

## 2015-02-27 NOTE — Patient Instructions (Signed)
Diabetes and Exercise Exercising regularly is important. It is not just about losing weight. It has many health benefits, such as:  Improving your overall fitness, flexibility, and endurance.  Increasing your bone density.  Helping with weight control.  Decreasing your body fat.  Increasing your muscle strength.  Reducing stress and tension.  Improving your overall health. People with diabetes who exercise gain additional benefits because exercise:  Reduces appetite.  Improves the body's use of blood sugar (glucose).  Helps lower or control blood glucose.  Decreases blood pressure.  Helps control blood lipids (such as cholesterol and triglycerides).  Improves the body's use of the hormone insulin by:  Increasing the body's insulin sensitivity.  Reducing the body's insulin needs.  Decreases the risk for heart disease because exercising:  Lowers cholesterol and triglycerides levels.  Increases the levels of good cholesterol (such as high-density lipoproteins [HDL]) in the body.  Lowers blood glucose levels. YOUR ACTIVITY PLAN  Choose an activity that you enjoy, and set realistic goals. To exercise safely, you should begin practicing any new physical activity slowly, and gradually increase the intensity of the exercise over time. Your health care provider or diabetes educator can help create an activity plan that works for you. General recommendations include:  Encouraging children to engage in at least 60 minutes of physical activity each day.  Stretching and performing strength training exercises, such as yoga or weight lifting, at least 2 times per week.  Performing a total of at least 150 minutes of moderate-intensity exercise each week, such as brisk walking or water aerobics.  Exercising at least 3 days per week, making sure you allow no more than 2 consecutive days to pass without exercising.  Avoiding long periods of inactivity (90 minutes or more). When you  have to spend an extended period of time sitting down, take frequent breaks to walk or stretch. RECOMMENDATIONS FOR EXERCISING WITH TYPE 1 OR TYPE 2 DIABETES   Check your blood glucose before exercising. If blood glucose levels are greater than 240 mg/dL, check for urine ketones. Do not exercise if ketones are present.  Avoid injecting insulin into areas of the body that are going to be exercised. For example, avoid injecting insulin into:  The arms when playing tennis.  The legs when jogging.  Keep a record of:  Food intake before and after you exercise.  Expected peak times of insulin action.  Blood glucose levels before and after you exercise.  The type and amount of exercise you have done.  Review your records with your health care provider. Your health care provider will help you to develop guidelines for adjusting food intake and insulin amounts before and after exercising.  If you take insulin or oral hypoglycemic agents, watch for signs and symptoms of hypoglycemia. They include:  Dizziness.  Shaking.  Sweating.  Chills.  Confusion.  Drink plenty of water while you exercise to prevent dehydration or heat stroke. Body water is lost during exercise and must be replaced.  Talk to your health care provider before starting an exercise program to make sure it is safe for you. Remember, almost any type of activity is better than none.   This information is not intended to replace advice given to you by your health care provider. Make sure you discuss any questions you have with your health care provider.   Document Released: 06/27/2003 Document Revised: 08/21/2014 Document Reviewed: 09/13/2012 Elsevier Interactive Patient Education 2016 [employer]  

## 2015-03-01 DIAGNOSIS — Z794 Long term (current) use of insulin: Principal | ICD-10-CM

## 2015-03-01 DIAGNOSIS — E119 Type 2 diabetes mellitus without complications: Secondary | ICD-10-CM | POA: Insufficient documentation

## 2015-03-01 NOTE — Assessment & Plan Note (Signed)
Patient was on 100 units twice a day of Novolin 70/30 and NovoLog sliding scale and glipizide and metformin. A1c today is 7.4. We will switch to Lantus starting at 50 units at bedtime and NovoLog 10 units with meals. We will keep on metformin but will switch to Januvia instead of glipizide. See back in 2 weeks and keep doing checks.

## 2015-03-04 ENCOUNTER — Telehealth: Payer: Self-pay | Admitting: Family Medicine

## 2015-03-05 NOTE — Telephone Encounter (Signed)
Stp and he states his fasting blood sugars have been running in the 250's. Pt also states he knocked over his gabapentin and lost about 60 pills and wants to know if we can give him 60 to cover so he doesn't run out.

## 2015-03-05 NOTE — Telephone Encounter (Signed)
Pt needs to follow up with Dr. Warrick Parisian. HgbA1C was slightly elevated last blood draw. Pt needs to be on low carb diet. I can not refill gabapentin. That is a question for Dr. Warrick Parisian

## 2015-03-07 ENCOUNTER — Encounter: Payer: Self-pay | Admitting: Family Medicine

## 2015-03-07 ENCOUNTER — Ambulatory Visit (INDEPENDENT_AMBULATORY_CARE_PROVIDER_SITE_OTHER): Payer: Medicare Other | Admitting: Family Medicine

## 2015-03-07 VITALS — BP 165/72 | HR 72 | Temp 98.9°F | Ht 71.0 in | Wt 204.0 lb

## 2015-03-07 DIAGNOSIS — R809 Proteinuria, unspecified: Secondary | ICD-10-CM | POA: Diagnosis not present

## 2015-03-07 DIAGNOSIS — I10 Essential (primary) hypertension: Secondary | ICD-10-CM | POA: Diagnosis not present

## 2015-03-07 DIAGNOSIS — Z794 Long term (current) use of insulin: Secondary | ICD-10-CM | POA: Diagnosis not present

## 2015-03-07 DIAGNOSIS — E1110 Type 2 diabetes mellitus with ketoacidosis without coma: Secondary | ICD-10-CM | POA: Insufficient documentation

## 2015-03-07 DIAGNOSIS — E119 Type 2 diabetes mellitus without complications: Secondary | ICD-10-CM

## 2015-03-07 MED ORDER — GABAPENTIN 600 MG PO TABS: 1200.0000 mg | ORAL_TABLET | Freq: Three times a day (TID) | ORAL | Status: DC

## 2015-03-07 NOTE — Assessment & Plan Note (Signed)
Patient has elevated blood pressure but forgot to take his blood pressure medications, we'll check at next visit and resume current medications.

## 2015-03-07 NOTE — Assessment & Plan Note (Signed)
Patient has been having neuropathy that's uncontrolled because he ran out of his Neurontin, will refill that we'll also check a microalbumin. We are going to increase his Lantus to 55 units at bedtime and increase his mealtime to 15 units 3 times daily with meals.

## 2015-03-07 NOTE — Progress Notes (Signed)
BP 165/72 mmHg  Pulse 72  Temp(Src) 98.9 F (37.2 C) (Oral)  Ht '5\' 11"'$  (1.803 m)  Wt 204 lb (92.534 kg)  BMI 28.46 kg/m2   Subjective:    Patient ID: [name], male    DOB: Jan 26, 1951, 64 y.o.   MRN: [id number]  HPI: [name] is a 64 y.o. male presenting on 03/07/2015 for Diabetes and Medication Refill   HPI  Diabetes recheck Patient presents today for diabetes recheck after a week and a half of switching his medications since he came to [place] as a new patient. Currently taking metformin 1000 mg twice daily and Januvia 100 mg daily and Lantus 50 at bedtime and NovoLog 10 units 3 times daily with meals. He is also usually below August a sliding scale when he gets up over 200. His blood sugars have been running anywhere from the high 100s up to the 300s. Down in the mornings he is getting in the 150s to 190s and then after meals he can get up to 300s. He denies any chest pain or shortness of breath or new changes in vision. He denies any hypoglycemic episodes.  Hypertension Patient also presents today for her high blood pressure recheck. His blood pressure today is 165/72. He does admit that he has been feeling ill since he ran out of his Neurontin and hasn't taken his blood pressure medication the last 2 days. He denies any chest pain, shortness of breath, vision changes, focal numbness or weakness.  Relevant past medical, surgical, family and social history reviewed and updated as indicated. Interim medical history since our last visit reviewed. Allergies and medications reviewed and updated.  Review of Systems  Constitutional: Negative for fever and chills.  HENT: Negative for ear discharge and ear pain.   Eyes: Negative for discharge and visual disturbance.  Respiratory: Negative for shortness of breath and wheezing.   Cardiovascular: Negative for chest pain, palpitations and leg swelling.  Gastrointestinal: Negative for abdominal pain, diarrhea and constipation.    Genitourinary: Negative for difficulty urinating.  Musculoskeletal: Negative for back pain and gait problem.  Skin: Negative for rash.  Neurological: Negative for dizziness, syncope, light-headedness and headaches.  All other systems reviewed and are negative.   Per HPI unless specifically indicated above     Medication List       This list is accurate as of: 03/07/15  9:34 AM.  Always use your most recent med list.               amLODipine 10 MG tablet  Commonly known as:  NORVASC  Take 10 mg by mouth daily.     atenolol 100 MG tablet  Commonly known as:  TENORMIN  Take 100 mg by mouth daily.     buPROPion 150 MG 24 hr tablet  Commonly known as:  WELLBUTRIN XL  Take 1 tablet (150 mg total) by mouth daily.     gabapentin 600 MG tablet  Commonly known as:  NEURONTIN  Take 2 tablets (1,200 mg total) by mouth 3 (three) times daily.     gemfibrozil 600 MG tablet  Commonly known as:  LOPID  Take 600 mg by mouth.     insulin aspart 100 UNIT/ML FlexPen  Commonly known as:  NOVOLOG FLEXPEN  Inject 10 Units into the skin 3 (three) times daily with meals.     Insulin Glargine 100 UNIT/ML Solostar Pen  Commonly known as:  LANTUS SOLOSTAR  Inject 50 Units into the skin  daily at 10 pm.     lisinopril-hydrochlorothiazide 20-12.5 MG tablet  Commonly known as:  PRINZIDE,ZESTORETIC  Take by mouth 2 (two) times daily.     metFORMIN 1000 MG tablet  Commonly known as:  GLUCOPHAGE  Take 1,000 mg by mouth.     omeprazole 40 MG capsule  Commonly known as:  PRILOSEC  Take 20 mg by mouth 2 (two) times daily.     oxyCODONE-acetaminophen 5-325 MG tablet  Commonly known as:  PERCOCET/ROXICET  Take by mouth.     OXYCONTIN 30 MG 12 hr tablet  Generic drug:  oxyCODONE  Take 30 mg by mouth.     Pen Needles 31G X 6 MM Misc  1 each by Does not apply route 4 (four) times daily.     sitaGLIPtin 100 MG tablet  Commonly known as:  JANUVIA  Take 1 tablet (100 mg total) by mouth  daily.     tiZANidine 4 MG tablet  Commonly known as:  ZANAFLEX  Take 4 mg by mouth 2 (two) times daily as needed for muscle spasms.           Objective:    BP 165/72 mmHg  Pulse 72  Temp(Src) 98.9 F (37.2 C) (Oral)  Ht '5\' 11"'$  (1.803 m)  Wt 204 lb (92.534 kg)  BMI 28.46 kg/m2  Wt Readings from Last 3 Encounters:  03/07/15 204 lb (92.534 kg)  02/27/15 210 lb (95.255 kg)  02/14/14 212 lb 8.4 oz (96.4 kg)    Physical Exam  Constitutional: He is oriented to person, place, and time. He appears well-developed and well-nourished. No distress.  Eyes: Conjunctivae and EOM are normal. Pupils are equal, round, and reactive to light. Right eye exhibits no discharge. No scleral icterus.  Neck: Neck supple. No thyromegaly present.  Cardiovascular: Normal rate, regular rhythm, normal heart sounds and intact distal pulses.   No murmur heard. Pulmonary/Chest: Effort normal and breath sounds normal. No respiratory distress. He has no wheezes.  Musculoskeletal: Normal range of motion. He exhibits no edema.  Lymphadenopathy:    He has no cervical adenopathy.  Neurological: He is alert and oriented to person, place, and time. He has normal strength. He is not disoriented. No cranial nerve deficit or sensory deficit. Coordination normal.  Skin: Skin is warm and dry. No rash noted. He is not diaphoretic.  Psychiatric: He has a normal mood and affect. His behavior is normal.  Vitals reviewed.  Diabetic Foot Exam - Simple   Simple Foot Form  Diabetic Foot exam was performed with the following findings:  Yes 03/07/2015  1:01 PM  Visual Inspection  No deformities, no ulcerations, no other skin breakdown bilaterally:  Yes  Sensation Testing  Intact to touch and monofilament testing bilaterally:  Yes  Pulse Check  Posterior Tibialis and Dorsalis pulse intact bilaterally:  Yes  Comments     Results for orders placed or performed in visit on 02/27/15  POCT glycosylated hemoglobin (Hb A1C)    Result Value Ref Range   Hemoglobin A1C 7.4       Assessment & Plan:   Problem List Items Addressed This Visit      Cardiovascular and Mediastinum   Essential hypertension, benign    Patient has elevated blood pressure but forgot to take his blood pressure medications, we'll check at next visit and resume current medications.        Endocrine   Type 2 diabetes mellitus without complication, with long-term current use of insulin ([place])  Patient has been having neuropathy that's uncontrolled because he ran out of his Neurontin, will refill that we'll also check a microalbumin. We are going to increase his Lantus to 55 units at bedtime and increase his mealtime to 15 units 3 times daily with meals.      Relevant Medications   gabapentin (NEURONTIN) 600 MG tablet   Other Relevant Orders   Microalbumin / creatinine urine ratio       Follow up plan: Return in about 4 weeks (around 04/04/2015), or if symptoms worsen or fail to improve, for diabetes recheck.  Counseling provided for all of the vaccine components Orders Placed This Encounter  Procedures  . Microalbumin / creatinine urine ratio    Caryl Pina, MD [facility] 03/07/2015, 9:34 AM

## 2015-03-08 LAB — MICROALBUMIN / CREATININE URINE RATIO
CREATININE, UR: 16.7 mg/dL
MICROALB/CREAT RATIO: 3662.3 mg/g{creat} — AB (ref 0.0–30.0)
Microalbumin, Urine: 611.6 ug/mL

## 2015-03-09 NOTE — Addendum Note (Signed)
Addended by: Thana Ates on: 03/09/2015 09:51 AM   Modules accepted: Orders

## 2015-03-13 ENCOUNTER — Ambulatory Visit: Payer: Self-pay | Admitting: Family Medicine

## 2015-03-19 ENCOUNTER — Other Ambulatory Visit: Payer: Self-pay | Admitting: *Deleted

## 2015-03-19 DIAGNOSIS — Z1211 Encounter for screening for malignant neoplasm of colon: Secondary | ICD-10-CM

## 2015-03-20 ENCOUNTER — Encounter: Payer: Self-pay | Admitting: Pharmacist

## 2015-03-20 ENCOUNTER — Ambulatory Visit (INDEPENDENT_AMBULATORY_CARE_PROVIDER_SITE_OTHER): Payer: Medicare Other | Admitting: Pharmacist

## 2015-03-20 VITALS — BP 138/78 | HR 70 | Ht 71.0 in | Wt 212.0 lb

## 2015-03-20 DIAGNOSIS — E1140 Type 2 diabetes mellitus with diabetic neuropathy, unspecified: Secondary | ICD-10-CM | POA: Diagnosis not present

## 2015-03-20 DIAGNOSIS — Z794 Long term (current) use of insulin: Secondary | ICD-10-CM | POA: Diagnosis not present

## 2015-03-20 MED ORDER — PEN NEEDLES 31G X 6 MM MISC: 1.0000 | Freq: Four times a day (QID) | Status: DC

## 2015-03-20 MED ORDER — INSULIN ASPART 100 UNIT/ML FLEXPEN: PEN_INJECTOR | SUBCUTANEOUS | Status: DC

## 2015-03-20 MED ORDER — INSULIN DEGLUDEC 200 UNIT/ML ~~LOC~~ SOPN: 60.0000 [IU] | PEN_INJECTOR | Freq: Every day | SUBCUTANEOUS | Status: DC

## 2015-03-20 NOTE — Progress Notes (Signed)
Subjective:    [name] is a 64 y.o. male who presents for an initial evaluation of Type 2 diabetes mellitus and diabetes education.  Current symptoms/problems include hyperglycemia, visual disturbances and neuropathy and have been unchanged.  Known diabetic complications: nephropathy  Has neuropathy in hands and feet, currently on gabapentin '600mg'$  2 tabs BID.  Pt also reports falling during these sleep walking episodes, has knot on left elbow from previous fall.   Cardiovascular risk factors: advanced age (older than 51 for men, 72 for women), diabetes mellitus, hypertension, male gender, microalbuminuria, obesity (BMI >= 30 kg/m2) and sedentary lifestyle, and blurry vision. Current diabetic medications include Lantus, Novolog, Januvia, and Metformin. Currently taking metformin '1000mg'$  BID and Januvia '100mg'$  daily. Also taking 55 units of Lantus at night and up to 25 units of Novolog ~5 times daily due to high BG readings.    Eye exam current (within one year): Diabetic eye exam scheduled for 04/09/2015. Reports vision is more blurry than in the past. Weight trend: fluctuating a bit Prior visit with dietician: no Current diet: in general, an "unhealthy" diet. Large amount of carbs in diet (biscuits, pasta, bread). Avoids sugary drinks and fried foods, likes eating nonstarchy vegetables but not large amounts. Need to decrease portion sizes and increase nonstarchy vegetables.  Current exercise: none, says back hurts too much to to exercise. Cannot walk, cannot lift arms above head, and cannot swim without muscles tightening and feeling pain.   Patient reports waking during the night and sometimes he is unaware of doing this, almost like sleep walking. Pt also reports falling during these sleep walking episodes, has knot on left elbow from previous fall.  Patient diagnosed with sleep apnea and has CPAP  Current monitoring regimen: home blood tests - 4-5 times daily Home blood sugar records:   High: 358 Low: 123 30d avg: 221 Any episodes of hypoglycemia? no  Is He on ACE inhibitor or angiotensin II receptor blocker?  Yes, lisinopril-HCTZ 20-12.5 BID  Objective:   Filed Vitals:   03/20/15 0904  BP: 138/78  Pulse: 70   Filed Vitals:   03/20/15 0904  Weight: 212 lb (96.163 kg)   Lab Review GLUCOSE, BLD (mg/dL)  Date Value  02/14/2014 181*  12/24/2010 180*  12/23/2010 205*   CO2 (mEq/L)  Date Value  02/14/2014 24  12/24/2010 25  12/23/2010 26   BUN (mg/dL)  Date Value  02/14/2014 27*  12/24/2010 20  12/23/2010 23   CREATININE, SER (mg/dL)  Date Value  02/14/2014 1.01  12/24/2010 0.87  12/23/2010 1.10   Assessment:    Diabetes Mellitus type II, under poor control.  Last A1c 7.4 and avg BG 221. Currently eating an unhealthy diet with high amount of carbs and low amount of nonstarchy vegetables.  Sleep disturbance / walking  Plan:    1.  Rx changes: Discontinue Lantus and start Tresiba (gave 100 units/mL sample, but sent script for 200 units/mL) 65 units once daily. If after 1 week fasting BG in AM is over 150 increase to 70 units daily. May continue to increase by 5 units once a week until AM fasting BG less than 150.  Rx's sent with update directions for Novolog and pen needles. 2.  Education: Reviewed 'ABCs' of diabetes management (respective goals in parentheses):  A1C (<7), blood pressure (<130/80), and cholesterol (LDL <100). 3.  Compliance at present is estimated to be good. Efforts to improve compliance (if necessary) will be directed at dietary modifications: increse intake of  nonstarchy vegetables, limit carbs, and decrease poriton sizes. Spent most of visti counseling on educating patient and wife about CHO and CHO limiting diet.  4.  Referred patient to talk to Dr. Warrick Parisian about sleep disturbances and falls at night. 5. Follow up: 2 weeks  with Dr Warrick Parisian.  Follow up with clinical pharmacist / CDE in 2 months.   Cherre Robins, PharmD,  CPP

## 2015-03-20 NOTE — Patient Instructions (Signed)
Change from Lantus to [place] and increase to 65 units once a day.  If after 1 week your fasting blood glucose in the morning is over 150 then increase to 70 units daily.  May continue to increase by 5 units once a week until morning fasting blood glucose if less than 150.  Diabetes and Standards of Medical Care   Diabetes is complicated. You may find that your diabetes team includes a dietitian, nurse, diabetes educator, eye doctor, and more. To help everyone know what is going on and to help you get the care you deserve, the following schedule of care was developed to help keep you on track. Below are the tests, exams, vaccines, medicines, education, and plans you will need.  Blood Glucose Goals Prior to meals = 80 - 130 Within 2 hours of the start of a meal = less than 180  HbA1c test (goal is less than 7.0% - your last value was 7.4%) This test shows how well you have controlled your glucose over the past 2 to 3 months. It is used to see if your diabetes management plan needs to be adjusted.   It is performed at least 2 times a year if you are meeting treatment goals.  It is performed 4 times a year if therapy has changed or if you are not meeting treatment goals.  Blood pressure test  This test is performed at every routine medical visit. The goal is less than 140/90 mmHg for most people, but 130/80 mmHg in some cases. Ask your health care provider about your goal.  Dental exam  Follow up with the dentist regularly.  Eye exam  If you are diagnosed with type 1 diabetes as a child, get an exam upon reaching the age of 39 years or older and have had diabetes for 3 to 5 years. Yearly eye exams are recommended after that initial eye exam.  If you are diagnosed with type 1 diabetes as an adult, get an exam within 5 years of diagnosis and then yearly.  If you are diagnosed with type 2 diabetes, get an exam as soon as possible after the diagnosis and then yearly.  Foot care  exam  Visual foot exams are performed at every routine medical visit. The exams check for cuts, injuries, or other problems with the feet.  A comprehensive foot exam should be done yearly. This includes visual inspection as well as assessing foot pulses and testing for loss of sensation.  Check your feet nightly for cuts, injuries, or other problems with your feet. Tell your health care provider if anything is not healing.  Kidney function test (urine microalbumin)  This test is performed once a year.  Type 1 diabetes: The first test is performed 5 years after diagnosis.  Type 2 diabetes: The first test is performed at the time of diagnosis.  A serum creatinine and estimated glomerular filtration rate (eGFR) test is done once a year to assess the level of chronic kidney disease (CKD), if present.  Lipid profile (cholesterol, HDL, LDL, triglycerides)  Performed every 5 years for most people.  The goal for LDL is less than 100 mg/dL. If you are at high risk, the goal is less than 70 mg/dL.  The goal for HDL is 40 mg/dL to 50 mg/dL for men and 50 mg/dL to 60 mg/dL for women. An HDL cholesterol of 60 mg/dL or higher gives some protection against heart disease.  The goal for triglycerides is less than 150 mg/dL.  Influenza vaccine, pneumococcal vaccine, and hepatitis B vaccine  The influenza vaccine is recommended yearly.  The pneumococcal vaccine is generally given once in a lifetime. However, there are some instances when another vaccination is recommended. Check with your health care provider.  The hepatitis B vaccine is also recommended for adults with diabetes.  Diabetes self-management education  Education is recommended at diagnosis and ongoing as needed.  Treatment plan  Your treatment plan is reviewed at every medical visit.  Document Released: 02/01/2009 Document Revised: 12/07/2012 Document Reviewed: 09/06/2012 Exit[employer] Patient Information 2014 [employer].   

## 2015-04-03 ENCOUNTER — Other Ambulatory Visit: Payer: Self-pay | Admitting: *Deleted

## 2015-04-03 MED ORDER — ATENOLOL 100 MG PO TABS: 100.0000 mg | ORAL_TABLET | Freq: Every day | ORAL | Status: DC

## 2015-04-04 ENCOUNTER — Ambulatory Visit (INDEPENDENT_AMBULATORY_CARE_PROVIDER_SITE_OTHER): Payer: Medicare Other | Admitting: Family Medicine

## 2015-04-04 ENCOUNTER — Encounter: Payer: Self-pay | Admitting: Family Medicine

## 2015-04-04 ENCOUNTER — Ambulatory Visit (INDEPENDENT_AMBULATORY_CARE_PROVIDER_SITE_OTHER): Payer: Medicare Other

## 2015-04-04 VITALS — BP 158/86 | HR 72 | Temp 98.2°F | Ht 71.0 in | Wt 210.4 lb

## 2015-04-04 DIAGNOSIS — Z23 Encounter for immunization: Secondary | ICD-10-CM | POA: Diagnosis not present

## 2015-04-04 DIAGNOSIS — E119 Type 2 diabetes mellitus without complications: Secondary | ICD-10-CM

## 2015-04-04 DIAGNOSIS — I2510 Atherosclerotic heart disease of native coronary artery without angina pectoris: Secondary | ICD-10-CM | POA: Diagnosis not present

## 2015-04-04 DIAGNOSIS — E1140 Type 2 diabetes mellitus with diabetic neuropathy, unspecified: Secondary | ICD-10-CM

## 2015-04-04 DIAGNOSIS — R0781 Pleurodynia: Secondary | ICD-10-CM

## 2015-04-04 DIAGNOSIS — Z794 Long term (current) use of insulin: Secondary | ICD-10-CM

## 2015-04-04 DIAGNOSIS — R002 Palpitations: Secondary | ICD-10-CM | POA: Diagnosis not present

## 2015-04-04 DIAGNOSIS — Z1211 Encounter for screening for malignant neoplasm of colon: Secondary | ICD-10-CM

## 2015-04-04 DIAGNOSIS — I10 Essential (primary) hypertension: Secondary | ICD-10-CM | POA: Diagnosis not present

## 2015-04-04 MED ORDER — LISINOPRIL-HYDROCHLOROTHIAZIDE 20-12.5 MG PO TABS: 2.0000 | ORAL_TABLET | Freq: Every day | ORAL | Status: DC

## 2015-04-04 MED ORDER — INSULIN ASPART 100 UNIT/ML FLEXPEN: PEN_INJECTOR | SUBCUTANEOUS | Status: DC

## 2015-04-04 MED ORDER — ATORVASTATIN CALCIUM 20 MG PO TABS: 20.0000 mg | ORAL_TABLET | Freq: Every day | ORAL | Status: DC

## 2015-04-04 MED ORDER — ATENOLOL 100 MG PO TABS: 100.0000 mg | ORAL_TABLET | Freq: Every day | ORAL | Status: DC

## 2015-04-04 NOTE — Progress Notes (Signed)
BP 158/86 mmHg  Pulse 72  Temp(Src) 98.2 F (36.8 C) (Oral)  Ht '5\' 11"'$  (1.803 m)  Wt 210 lb 6.4 oz (95.437 kg)  BMI 29.36 kg/m2   Subjective:    Patient ID: [name], male    DOB: November 17, 1950, 64 y.o.   MRN: [id number]  HPI: [name] is a 64 y.o. male presenting on 04/04/2015 for Diabetes and Left side pain   HPI Type 2 diabetes recheck Patient comes in today for diabetes recheck. He is currently on tresiba and NovoLog. He is currently increasing his tresiba by 5 units every week as long as he is a.m. blood sugar remains above 150. His a.m. blood sugars have come down, before they were up over 250 and now they are more often just above 200. He is also taking his NovoLog as a sliding scale only and is often have him use 30-50 units after a meal to bring his blood sugar down. He is not taking any prenatal NovoLog at this point. He is still taking his metformin 1000 twice a day and Januvia 100 mg. He is also on an ACE inhibitor and statin. He still having neuropathic pain but has been better on his current dose of Neurontin.  Left side pain Left St. pain has been intermittent over the past year. The pain is in his left posterior ribs and hurts more with deep inspiration and palpation of the area. He denies any known trauma. The pain sometimes becomes unbearable even with the pain medications. Occasionally the pain does radiate around to the front ribs on that same side. He denies any rash in the overlying area. He denies any fevers or chills.  Hypertension Patient is currently on lisinopril-hydrochlorothiazide at a low dose and atenolol and amlodipine. His blood pressure is still elevated 158/86. It is been consistently elevated over the last visit. Patient denies headaches, blurred vision, chest pains, shortness of breath, or weakness. Denies any side effects from medication and is content with current medication.   Relevant past medical, surgical, family and social history  reviewed and updated as indicated. Interim medical history since our last visit reviewed. Allergies and medications reviewed and updated.  Review of Systems  Constitutional: Negative for fever and chills.  HENT: Negative for congestion, ear discharge and ear pain.   Eyes: Negative for discharge and visual disturbance.  Respiratory: Negative for shortness of breath and wheezing.   Cardiovascular: Negative for chest pain and leg swelling.  Gastrointestinal: Negative for abdominal pain, diarrhea and constipation.  Genitourinary: Negative for difficulty urinating.  Musculoskeletal: Positive for myalgias and back pain. Negative for gait problem.  Skin: Negative for rash.  Neurological: Negative for dizziness, syncope, light-headedness and headaches.  All other systems reviewed and are negative.   Per HPI unless specifically indicated above     Medication List       This list is accurate as of: 04/04/15  9:30 AM.  Always use your most recent med list.               amLODipine 10 MG tablet  Commonly known as:  NORVASC  Take 10 mg by mouth daily.     atenolol 100 MG tablet  Commonly known as:  TENORMIN  Take 1 tablet (100 mg total) by mouth daily.     atorvastatin 20 MG tablet  Commonly known as:  LIPITOR  Take 1 tablet (20 mg total) by mouth daily.     buPROPion 150 MG 24 hr  tablet  Commonly known as:  WELLBUTRIN XL  Take 1 tablet (150 mg total) by mouth daily.     gabapentin 600 MG tablet  Commonly known as:  NEURONTIN  Take 2 tablets (1,200 mg total) by mouth 3 (three) times daily.     gemfibrozil 600 MG tablet  Commonly known as:  LOPID  Take 600 mg by mouth.     insulin aspart 100 UNIT/ML FlexPen  Commonly known as:  NOVOLOG FLEXPEN  Inject up to 30 units up to four times a day     Insulin Degludec 200 UNIT/ML Sopn  Commonly known as:  TRESIBA FLEXTOUCH  Inject 60-70 Units into the skin daily.     lisinopril-hydrochlorothiazide 20-12.5 MG tablet  Commonly  known as:  PRINZIDE,ZESTORETIC  Take 2 tablets by mouth daily.     metFORMIN 1000 MG tablet  Commonly known as:  GLUCOPHAGE  Take 1,000 mg by mouth 2 (two) times daily with a meal.     omeprazole 40 MG capsule  Commonly known as:  PRILOSEC  Take 20 mg by mouth 2 (two) times daily.     oxyCODONE-acetaminophen 5-325 MG tablet  Commonly known as:  PERCOCET/ROXICET  Take by mouth.     OXYCONTIN 30 MG 12 hr tablet  Generic drug:  oxyCODONE  Take 30 mg by mouth.     Pen Needles 31G X 6 MM Misc  1 each by Does not apply route 4 (four) times daily. Use with insulin pens to inject insulin up to 5 times daily     sitaGLIPtin 100 MG tablet  Commonly known as:  JANUVIA  Take 1 tablet (100 mg total) by mouth daily.     tiZANidine 4 MG tablet  Commonly known as:  ZANAFLEX  Take 4 mg by mouth 2 (two) times daily as needed for muscle spasms.           Objective:    BP 158/86 mmHg  Pulse 72  Temp(Src) 98.2 F (36.8 C) (Oral)  Ht '5\' 11"'$  (1.803 m)  Wt 210 lb 6.4 oz (95.437 kg)  BMI 29.36 kg/m2  Wt Readings from Last 3 Encounters:  04/04/15 210 lb 6.4 oz (95.437 kg)  03/20/15 212 lb (96.163 kg)  03/07/15 204 lb (92.534 kg)    Physical Exam  Constitutional: He is oriented to person, place, and time. He appears well-developed and well-nourished. No distress.  Eyes: Conjunctivae and EOM are normal. Pupils are equal, round, and reactive to light. Right eye exhibits no discharge. No scleral icterus.  Neck: Neck supple. No thyromegaly present.  Cardiovascular: Normal rate, regular rhythm, normal heart sounds and intact distal pulses.   No murmur heard. Pulmonary/Chest: Effort normal and breath sounds normal. No respiratory distress. He has no wheezes.  Abdominal: He exhibits no distension. There is no tenderness. There is no rebound and no guarding.  Musculoskeletal: Normal range of motion. He exhibits no edema.       Back:  Lymphadenopathy:    He has no cervical adenopathy.    Neurological: He is alert and oriented to person, place, and time. Coordination normal.  Skin: Skin is warm and dry. No rash noted. He is not diaphoretic.  Psychiatric: He has a normal mood and affect. His behavior is normal.  Vitals reviewed.   Results for orders placed or performed in visit on 03/07/15  Microalbumin / creatinine urine ratio  Result Value Ref Range   Creatinine, Urine 16.7 Not Estab. mg/dL   Microalbum.,U,Random 611.6 Not Estab.  ug/mL   MICROALB/CREAT RATIO 3662.3 (H) 0.0 - 30.0 mg/g creat   Rib x-ray: No signs of fractures or acute pulmonary abnormalities. Await final read by radiologist  Ekg: NSR except early first degree block, likely from atenolol.     Assessment & Plan:   Problem List Items Addressed This Visit      Cardiovascular and Mediastinum   Coronary atherosclerosis of native coronary artery - Primary   Relevant Medications   atenolol (TENORMIN) 100 MG tablet   lisinopril-hydrochlorothiazide (PRINZIDE,ZESTORETIC) 20-12.5 MG tablet   atorvastatin (LIPITOR) 20 MG tablet   Essential hypertension, benign   Relevant Medications   atenolol (TENORMIN) 100 MG tablet   lisinopril-hydrochlorothiazide (PRINZIDE,ZESTORETIC) 20-12.5 MG tablet   atorvastatin (LIPITOR) 20 MG tablet     Endocrine   Type 2 diabetes mellitus without complication, with long-term current use of insulin (HCC)   Relevant Medications   lisinopril-hydrochlorothiazide (PRINZIDE,ZESTORETIC) 20-12.5 MG tablet   atorvastatin (LIPITOR) 20 MG tablet   insulin aspart (NOVOLOG FLEXPEN) 100 UNIT/ML FlexPen    Other Visit Diagnoses    Screening for colon cancer        Relevant Orders    Fecal occult blood, imunochemical    Ambulatory referral to Gastroenterology    Type 2 diabetes mellitus with diabetic neuropathy, with long-term current use of insulin (HCC)        Relevant Medications    lisinopril-hydrochlorothiazide (PRINZIDE,ZESTORETIC) 20-12.5 MG tablet    atorvastatin (LIPITOR) 20  MG tablet    insulin aspart (NOVOLOG FLEXPEN) 100 UNIT/ML FlexPen    Intermittent palpitations        Relevant Orders    EKG 12-Lead (Completed)    Rib pain on left side        Relevant Orders    DG Chest 2 View (Completed)        Follow up plan: Return in about 3 months (around 07/03/2015).  Counseling provided for all of the vaccine components Orders Placed This Encounter  Procedures  . Fecal occult blood, imunochemical  . DG Chest 2 View  . Ambulatory referral to Gastroenterology    Caryl Pina, MD [facility] 04/04/2015, 9:30 AM

## 2015-04-05 ENCOUNTER — Encounter (INDEPENDENT_AMBULATORY_CARE_PROVIDER_SITE_OTHER): Payer: Self-pay | Admitting: *Deleted

## 2015-04-08 ENCOUNTER — Telehealth: Payer: Self-pay

## 2015-04-08 MED ORDER — ALBUTEROL SULFATE HFA 108 (90 BASE) MCG/ACT IN AERS: 2.0000 | INHALATION_SPRAY | RESPIRATORY_TRACT | Status: DC | PRN

## 2015-04-08 NOTE — Telephone Encounter (Signed)
Patient thought you had wanted him to double his lisinopril/hctz combo but there was no prescription called in.  Please advise.

## 2015-04-08 NOTE — Telephone Encounter (Signed)
I did send for her to be doubled, I sent 60 pills instead of the 30 and it does show that it was confirmed [facility] on [facility]. We can resend it but I do not know why it is not there currently.

## 2015-04-09 NOTE — Telephone Encounter (Signed)
Pt notified of RX Verbalizes understanding 

## 2015-04-12 ENCOUNTER — Encounter: Payer: Self-pay | Admitting: Family Medicine

## 2015-04-12 ENCOUNTER — Ambulatory Visit (INDEPENDENT_AMBULATORY_CARE_PROVIDER_SITE_OTHER): Payer: Medicare Other | Admitting: Family Medicine

## 2015-04-12 VITALS — BP 162/84 | HR 83 | Temp 98.1°F | Ht 71.0 in | Wt 213.8 lb

## 2015-04-12 DIAGNOSIS — I2510 Atherosclerotic heart disease of native coronary artery without angina pectoris: Secondary | ICD-10-CM | POA: Diagnosis not present

## 2015-04-12 DIAGNOSIS — M7022 Olecranon bursitis, left elbow: Secondary | ICD-10-CM

## 2015-04-12 DIAGNOSIS — G4733 Obstructive sleep apnea (adult) (pediatric): Secondary | ICD-10-CM

## 2015-04-12 DIAGNOSIS — Z720 Tobacco use: Secondary | ICD-10-CM

## 2015-04-12 NOTE — Progress Notes (Signed)
BP 162/84 mmHg  Pulse 83  Temp(Src) 98.1 F (36.7 C) (Oral)  Ht '5\' 11"'$  (1.803 m)  Wt 213 lb 12.8 oz (96.979 kg)  BMI 29.83 kg/m2   Subjective:    Patient ID: [name], male    DOB: 1950/10/21, 64 y.o.   MRN: [id number]  HPI: [name] is a 64 y.o. male presenting on 04/12/2015 for Visit after CPAP machine prescription   HPI Obstructive sleep apnea follow-up Patient had been having a lot of issues with sleep walking waking up in the night and nonrestorative sleep, his levels on this sleep apnea machine were raised after his sleep study and he has been doing better on these since that time. His headaches have also reduced since that time as well. His blood pressure is still elevated but is starting to improve with this as well. He does still smoke and discussed how that can affect him especially his oxygenation at night. He is trying to quit using vapors and is mostly down to just using the E-cigarettes.  Elbow swelling, left Patient has been having a swelling on his left elbow after he fell and hit a few days ago. There is no redness or warmth to it but is just swollen and slightly tender to the touch. He's never had this before on this elbow but has had a previously on the other elbow.  Relevant past medical, surgical, family and social history reviewed and updated as indicated. Interim medical history since our last visit reviewed. Allergies and medications reviewed and updated.  Review of Systems  Constitutional: Negative for fever.  HENT: Negative for ear discharge and ear pain.   Eyes: Negative for discharge and visual disturbance.  Respiratory: Negative for shortness of breath and wheezing.   Cardiovascular: Negative for chest pain and leg swelling.  Gastrointestinal: Negative for abdominal pain, diarrhea and constipation.  Genitourinary: Negative for difficulty urinating.  Musculoskeletal: Positive for joint swelling. Negative for back pain and gait problem.    Skin: Negative for rash.  Neurological: Negative for syncope, light-headedness and headaches.  Psychiatric/Behavioral: Positive for sleep disturbance. Negative for suicidal ideas and self-injury.  All other systems reviewed and are negative.   Per HPI unless specifically indicated above     Medication List       This list is accurate as of: 04/12/15  9:14 AM.  Always use your most recent med list.               albuterol 108 (90 BASE) MCG/ACT inhaler  Commonly known as:  PROVENTIL HFA;VENTOLIN HFA  Inhale 2 puffs into the lungs every 4 (four) hours as needed for wheezing or shortness of breath.     amLODipine 10 MG tablet  Commonly known as:  NORVASC  Take 10 mg by mouth daily.     atenolol 100 MG tablet  Commonly known as:  TENORMIN  Take 1 tablet (100 mg total) by mouth daily.     atorvastatin 20 MG tablet  Commonly known as:  LIPITOR  Take 1 tablet (20 mg total) by mouth daily.     buPROPion 150 MG 24 hr tablet  Commonly known as:  WELLBUTRIN XL  Take 1 tablet (150 mg total) by mouth daily.     gabapentin 600 MG tablet  Commonly known as:  NEURONTIN  Take 2 tablets (1,200 mg total) by mouth 3 (three) times daily.     gemfibrozil 600 MG tablet  Commonly known as:  LOPID  Take  600 mg by mouth.     insulin aspart 100 UNIT/ML FlexPen  Commonly known as:  NOVOLOG FLEXPEN  Inject up to 30 units up to four times a day     Insulin Degludec 200 UNIT/ML Sopn  Commonly known as:  TRESIBA FLEXTOUCH  Inject 60-70 Units into the skin daily.     lisinopril-hydrochlorothiazide 20-12.5 MG tablet  Commonly known as:  PRINZIDE,ZESTORETIC  Take 2 tablets by mouth daily.     metFORMIN 1000 MG tablet  Commonly known as:  GLUCOPHAGE  Take 1,000 mg by mouth 2 (two) times daily with a meal.     omeprazole 40 MG capsule  Commonly known as:  PRILOSEC  Take 20 mg by mouth 2 (two) times daily.     oxyCODONE-acetaminophen 5-325 MG tablet  Commonly known as:   PERCOCET/ROXICET  Take by mouth.     OXYCONTIN 30 MG 12 hr tablet  Generic drug:  oxyCODONE  Take 30 mg by mouth.     Pen Needles 31G X 6 MM Misc  1 each by Does not apply route 4 (four) times daily. Use with insulin pens to inject insulin up to 5 times daily     sitaGLIPtin 100 MG tablet  Commonly known as:  JANUVIA  Take 1 tablet (100 mg total) by mouth daily.     tiZANidine 4 MG tablet  Commonly known as:  ZANAFLEX  Take 4 mg by mouth 2 (two) times daily as needed for muscle spasms.           Objective:    BP 162/84 mmHg  Pulse 83  Temp(Src) 98.1 F (36.7 C) (Oral)  Ht '5\' 11"'$  (1.803 m)  Wt 213 lb 12.8 oz (96.979 kg)  BMI 29.83 kg/m2  Wt Readings from Last 3 Encounters:  04/12/15 213 lb 12.8 oz (96.979 kg)  04/04/15 210 lb 6.4 oz (95.437 kg)  03/20/15 212 lb (96.163 kg)    Physical Exam  Constitutional: He is oriented to person, place, and time. He appears well-developed and well-nourished. No distress.  Eyes: Conjunctivae and EOM are normal. Pupils are equal, round, and reactive to light. Right eye exhibits no discharge. No scleral icterus.  Cardiovascular: Normal rate, regular rhythm, normal heart sounds and intact distal pulses.   No murmur heard. Pulmonary/Chest: Effort normal and breath sounds normal. No respiratory distress. He has no wheezes.  Musculoskeletal: Normal range of motion. He exhibits no edema.       Left elbow: He exhibits effusion (No erythema or warmth). He exhibits normal range of motion, no deformity and no laceration. Tenderness (Over olecranon bursitis) found.  Neurological: He is alert and oriented to person, place, and time. Coordination normal.  Skin: Skin is warm and dry. No rash noted. He is not diaphoretic.  Psychiatric: He has a normal mood and affect. His behavior is normal.  Vitals reviewed.   Results for orders placed or performed in visit on 03/07/15  Microalbumin / creatinine urine ratio  Result Value Ref Range   Creatinine,  Urine 16.7 Not Estab. mg/dL   Microalbum.,U,Random 611.6 Not Estab. ug/mL   MICROALB/CREAT RATIO 3662.3 (H) 0.0 - 30.0 mg/g creat   Olecranon bursa drainage: Patient has been having olecranon bursitis on his left elbow and is very significant and painful and he would like it drained. Prepped with Betadine and used topical anesthetic spray. Using a 25-gauge syringe drained about 4 mL of straw-colored fluid. Patient tolerated well. Placed simple bandage over site.    Assessment &  Plan:   Problem List Items Addressed This Visit      Respiratory   Obstructive sleep apnea - Primary    Patient presents today for an obstructive sleep apnea follow-up, they did a study and raised his levels from 9 to 14 on the CPAP. He feels like he is starting to sleep better while he is on it.        Other   Tobacco abuse    Discussed tobacco abuse and wanting to quit and patient wants to try to quit on his own. He is also using E cigarettes to help.       Other Visit Diagnoses    Olecranon bursitis, left        Once the bursa drained, performed procedure, patient understands risks and knowledge that it very likely could return        Follow up plan: Return if symptoms worsen or fail to improve.  Counseling provided for all of the vaccine components No orders of the defined types were placed in this encounter.    Caryl Pina, MD [facility] 04/12/2015, 9:14 AM

## 2015-04-17 NOTE — Assessment & Plan Note (Signed)
Patient presents today for an obstructive sleep apnea follow-up, they did a study and raised his levels from 9 to 14 on the CPAP. He feels like he is starting to sleep better while he is on it.

## 2015-04-17 NOTE — Assessment & Plan Note (Signed)
Discussed tobacco abuse and wanting to quit and patient wants to try to quit on his own. He is also using E cigarettes to help.

## 2015-04-24 ENCOUNTER — Encounter: Payer: Self-pay | Admitting: Family Medicine

## 2015-04-25 ENCOUNTER — Other Ambulatory Visit: Payer: Self-pay

## 2015-04-25 DIAGNOSIS — Z794 Long term (current) use of insulin: Principal | ICD-10-CM

## 2015-04-25 DIAGNOSIS — Z716 Tobacco abuse counseling: Secondary | ICD-10-CM

## 2015-04-25 DIAGNOSIS — E119 Type 2 diabetes mellitus without complications: Secondary | ICD-10-CM

## 2015-04-25 MED ORDER — SITAGLIPTIN PHOSPHATE 100 MG PO TABS: 100.0000 mg | ORAL_TABLET | Freq: Every day | ORAL | Status: DC

## 2015-04-25 MED ORDER — BUPROPION HCL ER (XL) 150 MG PO TB24: 150.0000 mg | ORAL_TABLET | Freq: Every day | ORAL | Status: DC

## 2015-04-25 NOTE — Telephone Encounter (Signed)
Last seen 04/12/15 Dr Dettinger  This med not on EPIC list

## 2015-04-25 NOTE — Telephone Encounter (Signed)
I don't understand what medications been requested, there are no pending meds and just says this medicine on Epic so I don't know what the refill is asking for. Caryl Pina, MD [facility] 04/25/2015, 2:54 PM

## 2015-04-29 ENCOUNTER — Other Ambulatory Visit: Payer: Self-pay | Admitting: Family Medicine

## 2015-05-03 ENCOUNTER — Ambulatory Visit: Payer: Self-pay | Admitting: Pharmacist

## 2015-05-06 ENCOUNTER — Encounter: Payer: Self-pay | Admitting: Family Medicine

## 2015-05-08 ENCOUNTER — Telehealth: Payer: Self-pay | Admitting: Family Medicine

## 2015-05-08 DIAGNOSIS — E1140 Type 2 diabetes mellitus with diabetic neuropathy, unspecified: Secondary | ICD-10-CM

## 2015-05-08 DIAGNOSIS — Z794 Long term (current) use of insulin: Principal | ICD-10-CM

## 2015-05-08 DIAGNOSIS — E119 Type 2 diabetes mellitus without complications: Secondary | ICD-10-CM

## 2015-05-08 MED ORDER — INSULIN DEGLUDEC 200 UNIT/ML ~~LOC~~ SOPN: 95.0000 [IU] | PEN_INJECTOR | Freq: Every day | SUBCUTANEOUS | Status: DC

## 2015-05-08 MED ORDER — INSULIN ASPART 100 UNIT/ML FLEXPEN: PEN_INJECTOR | SUBCUTANEOUS | Status: DC

## 2015-05-08 NOTE — Telephone Encounter (Signed)
Spoke with patient and he had to inject insulin up to 6 times per day last week when BG increased after several steroid injection.  Rx sent to [facility] drug with adjusted directions.  Also updated [facility] to reflect that he is currently taking 95 units once daily.

## 2015-05-15 ENCOUNTER — Encounter: Payer: Self-pay | Admitting: Family Medicine

## 2015-05-15 ENCOUNTER — Ambulatory Visit (INDEPENDENT_AMBULATORY_CARE_PROVIDER_SITE_OTHER): Payer: Medicare Other | Admitting: Family Medicine

## 2015-05-15 VITALS — BP 138/88 | HR 72 | Temp 99.4°F | Ht 71.0 in | Wt 211.8 lb

## 2015-05-15 DIAGNOSIS — J441 Chronic obstructive pulmonary disease with (acute) exacerbation: Secondary | ICD-10-CM | POA: Diagnosis not present

## 2015-05-15 MED ORDER — PREDNISONE 20 MG PO TABS: ORAL_TABLET | ORAL | Status: DC

## 2015-05-15 MED ORDER — AZITHROMYCIN 250 MG PO TABS: ORAL_TABLET | ORAL | Status: DC

## 2015-05-15 MED ORDER — FLUTICASONE PROPIONATE 50 MCG/ACT NA SUSP: 1.0000 | Freq: Two times a day (BID) | NASAL | Status: DC | PRN

## 2015-05-15 NOTE — Progress Notes (Signed)
BP 138/88 mmHg  Pulse 72  Temp(Src) 99.4 F (37.4 C) (Oral)  Ht '5\' 11"'$  (1.803 m)  Wt 211 lb 12.8 oz (96.072 kg)  BMI 29.55 kg/m2  SpO2 95%   Subjective:    Patient ID: [name], male    DOB: [birth year], 65 y.o.   MRN: [id number]  HPI: [name] is a 65 y.o. male presenting on 05/15/2015 for Sinusitis; Chest congestion; and Cough   HPI Sinus congestion and chest congestion and wheezing Patient has been having off and on sinus congestion and chest congestion and wheezing over the past month. He was getting over it mostly on his own and using some Mucinex but now does seem to get worse any starting to have more chest tightness and wheezing despite the Mucinex. He also uses an Afrin spray at home. He denies any chills but has had a couple fever. His temperature today in the office is 99.4. He does have wheezing and is also describing some shortness of breath and coughing spells that are worse at night than during the day. Also been using his albuterol inhaler and it helps some but not completely and is also been using a couple of doses of his girlfriends Pulmicort which helped.patient is decreasing his smoking already and using the E- cigarettes instead but has not quit yet.  Relevant past medical, surgical, family and social history reviewed and updated as indicated. Interim medical history since our last visit reviewed. Allergies and medications reviewed and updated.  Review of Systems  Constitutional: Positive for fever. Negative for chills.  HENT: Positive for congestion, postnasal drip, rhinorrhea, sinus pressure and sore throat. Negative for ear discharge, ear pain, sneezing and voice change.   Eyes: Negative for pain, discharge, redness and visual disturbance.  Respiratory: Positive for cough, chest tightness, shortness of breath and wheezing.   Cardiovascular: Negative for chest pain, palpitations and leg swelling.  Gastrointestinal: Negative for abdominal pain,  diarrhea and constipation.  Genitourinary: Negative for difficulty urinating.  Musculoskeletal: Negative for back pain and gait problem.  Skin: Negative for rash.  Neurological: Negative for syncope, light-headedness and headaches.  All other systems reviewed and are negative.   Per HPI unless specifically indicated above     Medication List       This list is accurate as of: 05/15/15  3:24 PM.  Always use your most recent med list.               albuterol 108 (90 Base) MCG/ACT inhaler  Commonly known as:  PROVENTIL HFA;VENTOLIN HFA  Inhale 2 puffs into the lungs every 4 (four) hours as needed for wheezing or shortness of breath.     amLODipine 10 MG tablet  Commonly known as:  NORVASC  Take 10 mg by mouth daily.     atenolol 100 MG tablet  Commonly known as:  TENORMIN  Take 1 tablet (100 mg total) by mouth daily.     atorvastatin 20 MG tablet  Commonly known as:  LIPITOR  Take 1 tablet (20 mg total) by mouth daily.     azithromycin 250 MG tablet  Commonly known as:  ZITHROMAX  Take 2 the first day and then one each day after.     buPROPion 150 MG 24 hr tablet  Commonly known as:  WELLBUTRIN XL  Take 1 tablet (150 mg total) by mouth daily.     fluticasone 50 MCG/ACT nasal spray  Commonly known as:  FLONASE  Place 1 spray  into both nostrils 2 (two) times daily as needed for allergies or rhinitis.     gabapentin 600 MG tablet  Commonly known as:  NEURONTIN  TAKE TWO (2) TABLETS THREE (3) TIMES DAILY     gemfibrozil 600 MG tablet  Commonly known as:  LOPID  Take 600 mg by mouth.     insulin aspart 100 UNIT/ML FlexPen  Commonly known as:  NOVOLOG FLEXPEN  Inject up to 40 units up to four times a day     Insulin Degludec 200 UNIT/ML Sopn  Commonly known as:  TRESIBA FLEXTOUCH  Inject 95 Units into the skin daily.     lisinopril-hydrochlorothiazide 20-12.5 MG tablet  Commonly known as:  PRINZIDE,ZESTORETIC  Take 2 tablets by mouth daily.     metFORMIN  1000 MG tablet  Commonly known as:  GLUCOPHAGE  Take 1,000 mg by mouth 2 (two) times daily with a meal.     omeprazole 40 MG capsule  Commonly known as:  PRILOSEC  Take 20 mg by mouth 2 (two) times daily.     oxyCODONE-acetaminophen 5-325 MG tablet  Commonly known as:  PERCOCET/ROXICET  Take by mouth.     OXYCONTIN 30 MG 12 hr tablet  Generic drug:  oxyCODONE  Take 30 mg by mouth.     Pen Needles 31G X 6 MM Misc  1 each by Does not apply route 4 (four) times daily. Use with insulin pens to inject insulin up to 5 times daily     predniSONE 20 MG tablet  Commonly known as:  DELTASONE  2 po at same time daily for 5 days     sitaGLIPtin 100 MG tablet  Commonly known as:  JANUVIA  Take 1 tablet (100 mg total) by mouth daily.     tiZANidine 4 MG tablet  Commonly known as:  ZANAFLEX  Take 4 mg by mouth 2 (two) times daily as needed for muscle spasms.           Objective:    BP 138/88 mmHg  Pulse 72  Temp(Src) 99.4 F (37.4 C) (Oral)  Ht '5\' 11"'$  (1.803 m)  Wt 211 lb 12.8 oz (96.072 kg)  BMI 29.55 kg/m2  SpO2 95%  Wt Readings from Last 3 Encounters:  05/15/15 211 lb 12.8 oz (96.072 kg)  04/12/15 213 lb 12.8 oz (96.979 kg)  04/04/15 210 lb 6.4 oz (95.437 kg)    Physical Exam  Constitutional: He is oriented to person, place, and time. He appears well-developed and well-nourished. No distress.  HENT:  Right Ear: Tympanic membrane, external ear and ear canal normal.  Left Ear: Tympanic membrane, external ear and ear canal normal.  Nose: Mucosal edema and rhinorrhea present. No sinus tenderness. No epistaxis. Right sinus exhibits maxillary sinus tenderness. Right sinus exhibits no frontal sinus tenderness. Left sinus exhibits maxillary sinus tenderness. Left sinus exhibits no frontal sinus tenderness.  Mouth/Throat: Uvula is midline and mucous membranes are normal. Posterior oropharyngeal edema and posterior oropharyngeal erythema present. No oropharyngeal exudate or  tonsillar abscesses.  Eyes: Conjunctivae and EOM are normal. Pupils are equal, round, and reactive to light. Right eye exhibits no discharge. No scleral icterus.  Neck: Neck supple. No thyromegaly present.  Cardiovascular: Normal rate, regular rhythm, normal heart sounds and intact distal pulses.   No murmur heard. Pulmonary/Chest: Effort normal. No respiratory distress. He has wheezes (end expiratory wheezes).  Musculoskeletal: Normal range of motion. He exhibits no edema.  Lymphadenopathy:    He has no cervical adenopathy.  Neurological: He is alert and oriented to person, place, and time. Coordination normal.  Skin: Skin is warm and dry. No rash noted. He is not diaphoretic.  Psychiatric: He has a normal mood and affect. His behavior is normal.  Nursing note and vitals reviewed.   Results for orders placed or performed in visit on 03/07/15  Microalbumin / creatinine urine ratio  Result Value Ref Range   Creatinine, Urine 16.7 Not Estab. mg/dL   Microalbum.,U,Random 611.6 Not Estab. ug/mL   MICROALB/CREAT RATIO 3662.3 (H) 0.0 - 30.0 mg/g creat      Assessment & Plan:   Problem List Items Addressed This Visit    None    Visit Diagnoses    COPD exacerbation ([place])    -  Primary    Relevant Medications    predniSONE (DELTASONE) 20 MG tablet    azithromycin (ZITHROMAX) 250 MG tablet    fluticasone (FLONASE) 50 MCG/ACT nasal spray        Follow up plan: Return if symptoms worsen or fail to improve.  Counseling provided for all of the vaccine components No orders of the defined types were placed in this encounter.    Caryl Pina, MD [facility] 05/15/2015, 3:24 PM

## 2015-05-22 ENCOUNTER — Ambulatory Visit: Payer: Medicare Other | Admitting: Family Medicine

## 2015-05-28 ENCOUNTER — Encounter: Payer: Self-pay | Admitting: Family Medicine

## 2015-05-28 ENCOUNTER — Ambulatory Visit (INDEPENDENT_AMBULATORY_CARE_PROVIDER_SITE_OTHER): Payer: Medicare Other | Admitting: Family Medicine

## 2015-05-28 VITALS — BP 135/72 | HR 76 | Temp 97.6°F | Ht 71.0 in | Wt 212.0 lb

## 2015-05-28 DIAGNOSIS — W07XXXA Fall from chair, initial encounter: Secondary | ICD-10-CM

## 2015-05-28 DIAGNOSIS — S0990XA Unspecified injury of head, initial encounter: Secondary | ICD-10-CM | POA: Diagnosis not present

## 2015-05-28 DIAGNOSIS — J439 Emphysema, unspecified: Secondary | ICD-10-CM

## 2015-05-28 DIAGNOSIS — W19XXXA Unspecified fall, initial encounter: Secondary | ICD-10-CM

## 2015-05-28 MED ORDER — FLUTICASONE FUROATE-VILANTEROL 100-25 MCG/INH IN AEPB: 1.0000 | INHALATION_SPRAY | Freq: Every day | RESPIRATORY_TRACT | Status: DC

## 2015-05-28 NOTE — Progress Notes (Signed)
BP 135/72 mmHg  Pulse 76  Temp(Src) 97.6 F (36.4 C) (Oral)  Ht '5\' 11"'$  (1.803 m)  Wt 212 lb (96.163 kg)  BMI 29.58 kg/m2  SpO2 95%   Subjective:    Patient ID: [name], male    DOB: 02-04-1951, 65 y.o.   MRN: [id number]  HPI: [name] is a 65 y.o. male presenting on 05/28/2015 for Fall one week ago and Shortness of Breath   HPI Fall and head trauma Patient presents today because he had a fall one week ago where he fell asleep all sitting on his barstool and landed face first on the ceramic tile floor. He said he didn't lose consciousness at that point. The loss of consciousness was very brief. He is since that time been experiencing headache blurred vision and some dizziness. He denies any numbness or weakness in any specific focal area. He does feel like she is to walk and balance issues. He denies any vision changes or hearing changes or changes in taste or smell.pain and contusion that he had on the back of his head and frontal head from the actual fall has gone down and resolved. The headache is described as a tight pressure sensation and mostly in the occipital region.  Shortness of breath Patient is also coming in today for a recheck. Says he is still feeling short of breath. He did have an acute exacerbation last time that he was in here with [place]. He feels like he is improved since that time but he is still having shortness of breath and having to use the albuterol inhaler a couple times a day. He does have nighttime symptoms as well. He also has obstructive sleep apnea and affects his nighttime symptoms and breathing as well. He is agreeable towards starting a maintenance medication. He is still smoking and is not ready to quit yet. We discussed the risks and consequences of that.  Relevant past medical, surgical, family and social history reviewed and updated as indicated. Interim medical history since our last visit reviewed. Allergies and medications reviewed and  updated.  Review of Systems  Constitutional: Negative for fever and chills.  HENT: Positive for congestion, postnasal drip, rhinorrhea, sinus pressure, sneezing and sore throat. Negative for ear discharge, ear pain and voice change.   Eyes: Negative for pain, discharge, redness and visual disturbance.  Respiratory: Positive for cough, chest tightness, shortness of breath and wheezing.   Cardiovascular: Negative for chest pain and leg swelling.  Gastrointestinal: Negative for abdominal pain, diarrhea and constipation.  Genitourinary: Negative for difficulty urinating.  Musculoskeletal: Negative for back pain and gait problem.  Skin: Negative for color change and rash.  Neurological: Positive for headaches. Negative for dizziness, seizures, syncope, weakness and light-headedness.  All other systems reviewed and are negative.   Per HPI unless specifically indicated above     Medication List       This list is accurate as of: 05/28/15  4:51 PM.  Always use your most recent med list.               albuterol 108 (90 Base) MCG/ACT inhaler  Commonly known as:  PROVENTIL HFA;VENTOLIN HFA  Inhale 2 puffs into the lungs every 4 (four) hours as needed for wheezing or shortness of breath.     amLODipine 10 MG tablet  Commonly known as:  NORVASC  Take 10 mg by mouth daily.     atenolol 100 MG tablet  Commonly known as:  TENORMIN  Take 1 tablet (100 mg total) by mouth daily.     atorvastatin 20 MG tablet  Commonly known as:  LIPITOR  Take 1 tablet (20 mg total) by mouth daily.     buPROPion 150 MG 24 hr tablet  Commonly known as:  WELLBUTRIN XL  Take 1 tablet (150 mg total) by mouth daily.     fluticasone 50 MCG/ACT nasal spray  Commonly known as:  FLONASE  Place 1 spray into both nostrils 2 (two) times daily as needed for allergies or rhinitis.     Fluticasone Furoate-Vilanterol 100-25 MCG/INH Aepb  Commonly known as:  BREO ELLIPTA  Inhale 1 puff into the lungs daily.      gabapentin 600 MG tablet  Commonly known as:  NEURONTIN  TAKE TWO (2) TABLETS THREE (3) TIMES DAILY     gemfibrozil 600 MG tablet  Commonly known as:  LOPID  Take 600 mg by mouth.     insulin aspart 100 UNIT/ML FlexPen  Commonly known as:  NOVOLOG FLEXPEN  Inject up to 40 units up to four times a day     Insulin Degludec 200 UNIT/ML Sopn  Commonly known as:  TRESIBA FLEXTOUCH  Inject 95 Units into the skin daily.     lisinopril-hydrochlorothiazide 20-12.5 MG tablet  Commonly known as:  PRINZIDE,ZESTORETIC  Take 2 tablets by mouth daily.     metFORMIN 1000 MG tablet  Commonly known as:  GLUCOPHAGE  Take 1,000 mg by mouth 2 (two) times daily with a meal.     omeprazole 40 MG capsule  Commonly known as:  PRILOSEC  Take 20 mg by mouth 2 (two) times daily.     oxyCODONE-acetaminophen 5-325 MG tablet  Commonly known as:  PERCOCET/ROXICET  Take by mouth.     OXYCONTIN 30 MG 12 hr tablet  Generic drug:  oxyCODONE  Take 30 mg by mouth.     Pen Needles 31G X 6 MM Misc  1 each by Does not apply route 4 (four) times daily. Use with insulin pens to inject insulin up to 5 times daily     sitaGLIPtin 100 MG tablet  Commonly known as:  JANUVIA  Take 1 tablet (100 mg total) by mouth daily.     tiZANidine 4 MG tablet  Commonly known as:  ZANAFLEX  Take 4 mg by mouth 2 (two) times daily as needed for muscle spasms.           Objective:    BP 135/72 mmHg  Pulse 76  Temp(Src) 97.6 F (36.4 C) (Oral)  Ht '5\' 11"'$  (1.803 m)  Wt 212 lb (96.163 kg)  BMI 29.58 kg/m2  SpO2 95%  Wt Readings from Last 3 Encounters:  05/28/15 212 lb (96.163 kg)  05/15/15 211 lb 12.8 oz (96.072 kg)  04/12/15 213 lb 12.8 oz (96.979 kg)    Physical Exam  Constitutional: He is oriented to person, place, and time. He appears well-developed and well-nourished. No distress.  HENT:  Head: Normocephalic and atraumatic.  Right Ear: External ear normal.  Left Ear: External ear normal.  Mouth/Throat:  Oropharynx is clear and moist. No oropharyngeal exudate.  Eyes: Conjunctivae and EOM are normal. Pupils are equal, round, and reactive to light. Right eye exhibits no discharge. No scleral icterus.  Neck: Neck supple. No thyromegaly present.  Cardiovascular: Normal rate, regular rhythm, normal heart sounds and intact distal pulses.   No murmur heard. Pulmonary/Chest: Effort normal. No respiratory distress. He has wheezes in the right upper field,  the right middle field, the right lower field, the left upper field, the left middle field and the left lower field. He has rhonchi in the right upper field and the left upper field. He has no rales.  Musculoskeletal: Normal range of motion. He exhibits no edema.  Lymphadenopathy:    He has no cervical adenopathy.  Neurological: He is alert and oriented to person, place, and time. Coordination normal.  Skin: Skin is warm and dry. No rash noted. He is not diaphoretic.  Psychiatric: He has a normal mood and affect. His behavior is normal.  Vitals reviewed.     Assessment & Plan:   Problem List Items Addressed This Visit    None    Visit Diagnoses    Pulmonary emphysema, unspecified emphysema type ([place])    -  Primary    Relevant Medications    Fluticasone Furoate-Vilanterol (BREO ELLIPTA) 100-25 MCG/INH AEPB    Fall, initial encounter        Patient had fallen head trauma and has pain in the back of his neck where he has plates and dizziness and headaches    Relevant Orders    CT Head Wo Contrast (Completed)    CT Cervical Spine Wo Contrast (Completed)    Head trauma, initial encounter        Relevant Orders    CT Head Wo Contrast (Completed)    CT Cervical Spine Wo Contrast (Completed)       Follow up plan: Return if symptoms worsen or fail to improve.  Counseling provided for all of the vaccine components Orders Placed This Encounter  Procedures  . CT Head Wo Contrast  . CT Cervical Spine Wo Contrast    Caryl Pina,  MD [facility] 05/28/2015, 4:51 PM

## 2015-05-29 ENCOUNTER — Ambulatory Visit (HOSPITAL_COMMUNITY)
Admission: RE | Admit: 2015-05-29 | Discharge: 2015-05-29 | Disposition: A | Discharge status: Home / Self Care | Payer: Medicare Other | Source: Ambulatory Visit | Attending: Family Medicine | Admitting: Family Medicine

## 2015-05-29 DIAGNOSIS — W19XXXA Unspecified fall, initial encounter: Secondary | ICD-10-CM

## 2015-05-29 DIAGNOSIS — S0990XA Unspecified injury of head, initial encounter: Secondary | ICD-10-CM

## 2015-05-29 DIAGNOSIS — R51 Headache: Secondary | ICD-10-CM | POA: Diagnosis present

## 2015-05-29 DIAGNOSIS — M542 Cervicalgia: Secondary | ICD-10-CM | POA: Insufficient documentation

## 2015-05-29 DIAGNOSIS — W1789XA Other fall from one level to another, initial encounter: Secondary | ICD-10-CM | POA: Diagnosis not present

## 2015-05-29 DIAGNOSIS — S098XXA Other specified injuries of head, initial encounter: Secondary | ICD-10-CM | POA: Diagnosis not present

## 2015-06-05 ENCOUNTER — Ambulatory Visit: Payer: Medicare Other | Admitting: Family Medicine

## 2015-06-16 ENCOUNTER — Emergency Department (HOSPITAL_COMMUNITY): Payer: Medicare Other

## 2015-06-16 ENCOUNTER — Encounter (HOSPITAL_COMMUNITY): Payer: Self-pay | Admitting: *Deleted

## 2015-06-16 ENCOUNTER — Emergency Department (HOSPITAL_COMMUNITY)
Admission: EM | Admit: 2015-06-16 | Discharge: 2015-06-16 | Disposition: A | Discharge status: Home / Self Care | Payer: Medicare Other | Attending: Emergency Medicine | Admitting: Emergency Medicine

## 2015-06-16 DIAGNOSIS — Z7951 Long term (current) use of inhaled steroids: Secondary | ICD-10-CM | POA: Diagnosis not present

## 2015-06-16 DIAGNOSIS — Y998 Other external cause status: Secondary | ICD-10-CM | POA: Diagnosis not present

## 2015-06-16 DIAGNOSIS — S29002A Unspecified injury of muscle and tendon of back wall of thorax, initial encounter: Secondary | ICD-10-CM | POA: Insufficient documentation

## 2015-06-16 DIAGNOSIS — W08XXXA Fall from other furniture, initial encounter: Secondary | ICD-10-CM | POA: Diagnosis not present

## 2015-06-16 DIAGNOSIS — S0003XA Contusion of scalp, initial encounter: Secondary | ICD-10-CM | POA: Insufficient documentation

## 2015-06-16 DIAGNOSIS — Z862 Personal history of diseases of the blood and blood-forming organs and certain disorders involving the immune mechanism: Secondary | ICD-10-CM | POA: Diagnosis not present

## 2015-06-16 DIAGNOSIS — I10 Essential (primary) hypertension: Secondary | ICD-10-CM | POA: Insufficient documentation

## 2015-06-16 DIAGNOSIS — Z85828 Personal history of other malignant neoplasm of skin: Secondary | ICD-10-CM | POA: Diagnosis not present

## 2015-06-16 DIAGNOSIS — Z8614 Personal history of Methicillin resistant Staphylococcus aureus infection: Secondary | ICD-10-CM | POA: Diagnosis not present

## 2015-06-16 DIAGNOSIS — F17210 Nicotine dependence, cigarettes, uncomplicated: Secondary | ICD-10-CM | POA: Diagnosis not present

## 2015-06-16 DIAGNOSIS — Y9289 Other specified places as the place of occurrence of the external cause: Secondary | ICD-10-CM | POA: Diagnosis not present

## 2015-06-16 DIAGNOSIS — E119 Type 2 diabetes mellitus without complications: Secondary | ICD-10-CM | POA: Diagnosis not present

## 2015-06-16 DIAGNOSIS — Z794 Long term (current) use of insulin: Secondary | ICD-10-CM | POA: Insufficient documentation

## 2015-06-16 DIAGNOSIS — Z7984 Long term (current) use of oral hypoglycemic drugs: Secondary | ICD-10-CM | POA: Diagnosis not present

## 2015-06-16 DIAGNOSIS — Z859 Personal history of malignant neoplasm, unspecified: Secondary | ICD-10-CM | POA: Diagnosis not present

## 2015-06-16 DIAGNOSIS — G629 Polyneuropathy, unspecified: Secondary | ICD-10-CM | POA: Insufficient documentation

## 2015-06-16 DIAGNOSIS — Z79899 Other long term (current) drug therapy: Secondary | ICD-10-CM | POA: Diagnosis not present

## 2015-06-16 DIAGNOSIS — S42021A Displaced fracture of shaft of right clavicle, initial encounter for closed fracture: Secondary | ICD-10-CM | POA: Diagnosis not present

## 2015-06-16 DIAGNOSIS — E782 Mixed hyperlipidemia: Secondary | ICD-10-CM | POA: Insufficient documentation

## 2015-06-16 DIAGNOSIS — Z872 Personal history of diseases of the skin and subcutaneous tissue: Secondary | ICD-10-CM | POA: Diagnosis not present

## 2015-06-16 DIAGNOSIS — J209 Acute bronchitis, unspecified: Secondary | ICD-10-CM | POA: Insufficient documentation

## 2015-06-16 DIAGNOSIS — Y9389 Activity, other specified: Secondary | ICD-10-CM | POA: Diagnosis not present

## 2015-06-16 DIAGNOSIS — S42001A Fracture of unspecified part of right clavicle, initial encounter for closed fracture: Secondary | ICD-10-CM

## 2015-06-16 DIAGNOSIS — I2510 Atherosclerotic heart disease of native coronary artery without angina pectoris: Secondary | ICD-10-CM | POA: Diagnosis not present

## 2015-06-16 DIAGNOSIS — S4991XA Unspecified injury of right shoulder and upper arm, initial encounter: Secondary | ICD-10-CM | POA: Diagnosis present

## 2015-06-16 LAB — CBG MONITORING, ED
GLUCOSE-CAPILLARY: 79 mg/dL (ref 65–99)
Glucose-Capillary: 57 mg/dL — ABNORMAL LOW (ref 65–99)

## 2015-06-16 MED ORDER — ONDANSETRON HCL 4 MG PO TABS
4.0000 mg | ORAL_TABLET | Freq: Once | ORAL | Status: AC
  Administered 2015-06-16: 4 mg via ORAL
  Filled 2015-06-16: qty 1

## 2015-06-16 MED ORDER — OXYCODONE-ACETAMINOPHEN 5-325 MG PO TABS: 1.0000 | ORAL_TABLET | Freq: Four times a day (QID) | ORAL | Status: DC | PRN

## 2015-06-16 MED ORDER — OXYCODONE-ACETAMINOPHEN 5-325 MG PO TABS
2.0000 | ORAL_TABLET | Freq: Once | ORAL | Status: AC
  Administered 2015-06-16: 2 via ORAL
  Filled 2015-06-16: qty 2

## 2015-06-16 NOTE — ED Notes (Signed)
Pt states intermittent chest pain x a few weeks. Recently placed on Zpak. Pt states streaks of blood noticed in phlegm. No better since the Zpack. Pt states he was up early this morning due to discomfort to the chest and was going to come to the ED. Pt fell to sleep on bar stool and fell. Severe right shoulder pain since the fall

## 2015-06-16 NOTE — Discharge Instructions (Signed)
The CT scan of your head is negative for skull fracture or any brain injury. The chest x-ray shows acute bronchitis, but no pneumonia. Please continue your inhaler and your Mucinex. Please increase fluids.  Clavicle Fracture The clavicle, also called the collarbone, is the long bone that connects your shoulder to your rib cage. You can feel your collarbone at the top of your shoulders and rib cage. A clavicle fracture is a broken clavicle. It is a common injury that can happen at any age.  CAUSES Common causes of a clavicle fracture include:  A direct blow to your shoulder.  A car accident.  A fall, especially if you try to break your fall with an outstretched arm. RISK FACTORS You may be at increased risk if:  You are younger than 25 years or older than 34 years. Most clavicle fractures happen to people who are younger than 25 years.  You are a male.  You play contact sports. SIGNS AND SYMPTOMS A fractured clavicle is painful. It also makes it hard to move your arm. Other signs and symptoms may include:  A shoulder that drops downward and forward.  Pain when trying to lift your shoulder.  Bruising, swelling, and tenderness over your clavicle.  A grinding noise when you try to move your shoulder.  A bump over your clavicle. DIAGNOSIS Your health care provider can usually diagnose a clavicle fracture by asking about your injury and examining your shoulder and clavicle. He or she may take an X-ray to determine the position of your clavicle. TREATMENT Treatment depends on the position of your clavicle after the fracture:  If the broken ends of the bone are not out of place, your health care provider may put your arm in a sling or wrap a support bandage around your chest (figure-of-eight wrap).  If the broken ends of the bone are out of place, you may need surgery. Surgery may involve placing screws, pins, or plates to keep your clavicle stable while it heals. Healing may take about  3 months. When your health care provider thinks your fracture has healed enough, you may have to do physical therapy to regain normal movement and build up your arm strength. HOME CARE INSTRUCTIONS   Apply ice to the injured area:  Put ice in a plastic bag.  Place a towel between your skin and the bag.  Leave the ice on for 20 minutes, 2-3 times a day.  If you have a wrap or splint:  Wear it all the time, and remove it only to take a bath or shower.  When you bathe or shower, keep your shoulder in the same position as when the sling or wrap is on.  Do not lift your arm.  If you have a figure-of-eight wrap:  Another person must tighten it every day.  It should be tight enough to hold your shoulders back.  Allow enough room to place your index finger between your body and the strap.  Loosen the wrap immediately if you feel numbness or tingling in your hands.  Only take medicines as directed by your health care provider.  Avoid activities that make the injury or pain worse for 4-6 weeks after surgery.  Keep all follow-up appointments. SEEK MEDICAL CARE IF:  Your medicine is not helping to relieve pain and swelling. SEEK IMMEDIATE MEDICAL CARE IF:  Your arm is numb, cold, or pale, even when the splint is loose. MAKE SURE YOU:   Understand these instructions.  Will watch  your condition.  Will get help right away if you are not doing well or get worse.   This information is not intended to replace advice given to you by your health care provider. Make sure you discuss any questions you have with your health care provider.   Document Released: 01/14/2005 Document Revised: 04/11/2013 Document Reviewed: 02/27/2013 Elsevier Interactive Patient Education 2016 [employer]  Acute Bronchitis Bronchitis is when the airways that extend from the windpipe into the lungs get red, puffy, and painful (inflamed). Bronchitis often causes thick spit (mucus) to develop. This leads to a  cough. A cough is the most common symptom of bronchitis. In acute bronchitis, the condition usually begins suddenly and goes away over time (usually in 2 weeks). Smoking, allergies, and asthma can make bronchitis worse. Repeated episodes of bronchitis may cause more lung problems. HOME CARE  Rest.  Drink enough fluids to keep your pee (urine) clear or pale yellow (unless you need to limit fluids as told by your doctor).  Only take over-the-counter or prescription medicines as told by your doctor.  Avoid smoking and secondhand smoke. These can make bronchitis worse. If you are a smoker, think about using nicotine gum or skin patches. Quitting smoking will help your lungs heal faster.  Reduce the chance of getting bronchitis again by:  Washing your hands often.  Avoiding people with cold symptoms.  Trying not to touch your hands to your mouth, nose, or eyes.  Follow up with your doctor as told. GET HELP IF: Your symptoms do not improve after 1 week of treatment. Symptoms include:  Cough.  Fever.  Coughing up thick spit.  Body aches.  Chest congestion.  Chills.  Shortness of breath.  Sore throat. GET HELP RIGHT AWAY IF:   You have an increased fever.  You have chills.  You have severe shortness of breath.  You have bloody thick spit (sputum).  You throw up (vomit) often.  You lose too much body fluid (dehydration).  You have a severe headache.  You faint. MAKE SURE YOU:   Understand these instructions.  Will watch your condition.  Will get help right away if you are not doing well or get worse.   This information is not intended to replace advice given to you by your health care provider. Make sure you discuss any questions you have with your health care provider.   Document Released: 09/23/2007 Document Revised: 12/07/2012 Document Reviewed: 09/27/2012 Elsevier Interactive Patient Education [employer]. The x-ray of your shoulder reveals an  acute clavicle fracture on the right. You have been fitted with a sling. Please apply ice to the area at 20 minute intervals. Please call Dr. Aline Brochure tomorrow for an appointment later this week concerning her fracture. Use Tylenol for mild pain, use Percocet for more severe pain. Please use a stool softener with the Percocet as it has a tendency to cause constipation. This medication may also cause drowsiness, please use it with caution. Please do not drink alcohol, drive a vehicle, operating machinery, or dissipated activities requiring concentration when taking the Percocet medication.

## 2015-06-16 NOTE — ED Provider Notes (Signed)
CSN: [id number]     Arrival date & time 06/16/15  4097 History   First MD Initiated Contact with Patient 06/16/15 [phone]     Chief Complaint  Patient presents with  . Shoulder Injury     (Consider location/radiation/quality/duration/timing/severity/associated sxs/prior Treatment) HPI Comments: Patient reports he has had no cough and chest pain over the last couple of weeks. He was treated with a Zithromax Z-Pak, states he got only partial relief from this but then started having chest discomfort again. He did not rest well on last evening. He fell asleep on a barstool he fell and injured his shoulder and states he hit his head. He did not have loss of consciousness. States he has had severe pain of his shoulder since that time. He states that he clean his shoulder and his neck on the right side he had a plate put in this area because of neck surgery. The patient has not had any difficulty with use of his upper or lower extremities. He denies being on any anticoagulation medications. He has not taken anything for the discomfort up to this point.  Patient is a 65 y.o. male presenting with shoulder injury. The history is provided by the patient.  Shoulder Injury This is a new problem. The current episode started today. The problem occurs constantly. The problem has been unchanged. Associated symptoms include arthralgias, chills, coughing and myalgias. The symptoms are aggravated by coughing. He has tried nothing for the symptoms. The treatment provided no relief.    Past Medical History  Diagnosis Date  . Essential hypertension, benign   . Type 2 diabetes mellitus ([place])   . Mixed hyperlipidemia   . OSA (obstructive sleep apnea)   . Partial small bowel obstruction ([place])   . 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 Coronary atherosclerosis of native coronary artery     Nonobstructive  . Erythrocytosis   . Peripheral arterial disease ([facility])     70% right iliac  . History of MRSA  infection   . Cancer ([place]) 2015    skin  . Neuropathy ([place]) 2013   Past Surgical History  Procedure Laterality Date  . Appendectomy    . Neck surgery    . Back surgery    . Hemorrhoid surgery    . Vasectomy     Family History  Problem Relation Age of Onset  . Coronary artery disease    . Hypertension    . Cancer Mother   . Depression Mother   . Diabetes Mother   . Heart disease Maternal Grandfather   . Hyperlipidemia Maternal Grandfather   . Hypertension Maternal Grandfather    Social History  Substance Use Topics  . Smoking status: Current Every Day Smoker -- 1.00 packs/day for 40 years    Types: Cigarettes  . Smokeless tobacco: Never Used  . Alcohol Use: No    Review of Systems  Constitutional: Positive for chills.  Respiratory: Positive for cough.        Chest wall pain  Musculoskeletal: Positive for myalgias, back pain and arthralgias.  All other systems reviewed and are negative.     Allergies  Nsaids; Prednisone; and Vancomycin  Home Medications   Prior to Admission medications   Medication Sig Start Date End Date Taking? Authorizing Provider  albuterol (PROVENTIL HFA;VENTOLIN HFA) 108 (90 BASE) MCG/ACT inhaler Inhale 2 puffs into the lungs every 4 (four) hours as needed for wheezing or shortness of breath. 04/08/15   Fransisca Kaufmann Dettinger,  MD  amLODipine (NORVASC) 10 MG tablet Take 10 mg by mouth daily.    Historical Provider, MD  atenolol (TENORMIN) 100 MG tablet Take 1 tablet (100 mg total) by mouth daily. 04/04/15   Fransisca Kaufmann Dettinger, MD  atorvastatin (LIPITOR) 20 MG tablet Take 1 tablet (20 mg total) by mouth daily. 04/04/15   Fransisca Kaufmann Dettinger, MD  buPROPion (WELLBUTRIN XL) 150 MG 24 hr tablet Take 1 tablet (150 mg total) by mouth daily. 04/25/15   Fransisca Kaufmann Dettinger, MD  fluticasone (FLONASE) 50 MCG/ACT nasal spray Place 1 spray into both nostrils 2 (two) times daily as needed for allergies or rhinitis. 05/15/15   Fransisca Kaufmann Dettinger, MD  Fluticasone  Furoate-Vilanterol (BREO ELLIPTA) 100-25 MCG/INH AEPB Inhale 1 puff into the lungs daily. 05/28/15   Fransisca Kaufmann Dettinger, MD  gabapentin (NEURONTIN) 600 MG tablet TAKE TWO (2) TABLETS THREE (3) TIMES DAILY 04/29/15   Fransisca Kaufmann Dettinger, MD  gemfibrozil (LOPID) 600 MG tablet Take 600 mg by mouth.    Historical Provider, MD  insulin aspart (NOVOLOG FLEXPEN) 100 UNIT/ML FlexPen Inject up to 40 units up to four times a day 05/08/15   Fransisca Kaufmann Dettinger, MD  Insulin Degludec (TRESIBA FLEXTOUCH) 200 UNIT/ML SOPN Inject 95 Units into the skin daily. 05/08/15   Fransisca Kaufmann Dettinger, MD  Insulin Pen Needle (PEN NEEDLES) 31G X 6 MM MISC 1 each by Does not apply route 4 (four) times daily. Use with insulin pens to inject insulin up to 5 times daily 03/20/15   Tammy Eckard, PHARMD  lisinopril-hydrochlorothiazide (PRINZIDE,ZESTORETIC) 20-12.5 MG tablet Take 2 tablets by mouth daily. 04/04/15   Fransisca Kaufmann Dettinger, MD  metFORMIN (GLUCOPHAGE) 1000 MG tablet Take 1,000 mg by mouth 2 (two) times daily with a meal.  11/14/13   Historical Provider, MD  omeprazole (PRILOSEC) 40 MG capsule Take 20 mg by mouth 2 (two) times daily.    Historical Provider, MD  oxyCODONE (OXYCONTIN) 30 MG 12 hr tablet Take 30 mg by mouth. 01/07/15   Historical Provider, MD  oxyCODONE-acetaminophen (PERCOCET/ROXICET) 5-325 MG tablet Take by mouth. 01/07/15   Historical Provider, MD  sitaGLIPtin (JANUVIA) 100 MG tablet Take 1 tablet (100 mg total) by mouth daily. 04/25/15   Fransisca Kaufmann Dettinger, MD  tiZANidine (ZANAFLEX) 4 MG tablet Take 4 mg by mouth 2 (two) times daily as needed for muscle spasms.    Historical Provider, MD   BP 154/79 mmHg  Pulse 78  Temp(Src) 98.2 F (36.8 C) (Oral)  Resp 20  Ht '5\' 11"'$  (1.803 m)  Wt 96.163 kg  BMI 29.58 kg/m2  SpO2 97% Physical Exam  Constitutional: He is oriented to person, place, and time. He appears well-developed and well-nourished.  Non-toxic appearance.  HENT:  Head: Normocephalic.  Right Ear: Tympanic  membrane and external ear normal.  Left Ear: Tympanic membrane and external ear normal.  Mild right occipital area tenderness, no broken skin area.  Eyes: EOM and lids are normal. Pupils are equal, round, and reactive to light.  Neck: Normal range of motion. Neck supple. Carotid bruit is not present.  Cardiovascular: Normal rate, regular rhythm, normal heart sounds, intact distal pulses and normal pulses.   Pulmonary/Chest: Breath sounds normal. No respiratory distress.  No rib area pain on the right or left. Patient speaks in complete sentences. Scattered rhonchi present bilat.. Symmetrical rise and fall of the chest noted.  Abdominal: Soft. Bowel sounds are normal. There is no tenderness. There is no guarding.  Musculoskeletal:  Right shoulder: He exhibits decreased range of motion, tenderness and pain. He exhibits no deformity.       Arms: There is no pain of the right clavicle, but there is pain just under the clavicle area. There is pain at the anterior and posterior right shoulder. There is no evidence of deformity or dislocation this time. There is full range of motion of the right elbow and wrist and fingers. The radial pulses are 2+ bilaterally.  Lymphadenopathy:       Head (right side): No submandibular adenopathy present.       Head (left side): No submandibular adenopathy present.    He has no cervical adenopathy.  Neurological: He is alert and oriented to person, place, and time. He has normal strength. No cranial nerve deficit or sensory deficit.  Skin: Skin is warm and dry.  Psychiatric: He has a normal mood and affect. His speech is normal.  Nursing note and vitals reviewed.   ED Course  Procedures (including critical care time) FRACTURE CARE RIGHT clavicle.. Patient sustained a fall early this morning from a barstool, and injured the right shoulder. The x-ray reveals a fracture of the right clavicle. I have discussed the fracture with the patient in terms which he  understands.  I discussed the procedure in terms which he understands. The patient is in agreement with proceeding with the fracture care. The radial and brachial pulses were noted to be 2+. The patient has symmetrical rise and fall of the chest, and speaks in complete sentences. The patient was fitted with a shoulder immobilizer. Ice pack was applied. Pain medication was given. The patient tolerated the procedure without problem. Labs Review Labs Reviewed - No data to display  Imaging Review No results found. I have personally reviewed and evaluated these images and lab results as part of my medical decision-making.   EKG Interpretation   Date/Time:  Sunday June 16 2015 08:51:27 EST Ventricular Rate:  73 PR Interval:  155 QRS Duration: 102 QT Interval:  390 QTC Calculation: 430 R Axis:   63 Text Interpretation:  Sinus rhythm Abnormal inferior Q waves Confirmed by  COOK  MD, BRIAN ([id number]) on 06/16/2015 9:15:22 AM      MDM  The electrocardiogram is negative for acute event. The vital signs are within normal limits. CT scan of the head is negative for acute intracranial abnormalities. X-ray of the right shoulder shows a fracture of the mid right clavicle, no subluxation or dislocation of the humerus.  The patient is fitted with a shoulder immobilizer. A prescription for Percocet one every 6 hours his been given to the patient.  I further advised the patient to use a stool softener while taking this medication because of its tendency for constipation. The patient will follow-up with Dr. Aline Brochure for orthopedic evaluation and management.   The chest x-ray shows moderate changes of acute bronchitis, but no airspace pneumonia. The patient is currently using an albuterol inhaler and Mucinex. Will add a short course of steroid to assist with the breathing.    Final diagnoses:  None    **I have reviewed nursing notes, vital signs, and all appropriate lab and imaging results for this  patient.Lily Kocher, PA-C 06/16/15 [id number], PA-C 06/16/15 1040  Nat Christen, MD 06/16/15 [phone]

## 2015-06-16 NOTE — ED Notes (Signed)
Pt awake alert and oriented x 4. Pt drank 8 oz cranberry juice.

## 2015-06-18 ENCOUNTER — Ambulatory Visit (INDEPENDENT_AMBULATORY_CARE_PROVIDER_SITE_OTHER): Payer: Medicare Other | Admitting: Orthopaedic Surgery

## 2015-06-18 VITALS — BP 157/68 | HR 85 | Temp 98.1°F | Ht 71.0 in | Wt 208.2 lb

## 2015-06-18 DIAGNOSIS — S42021A Displaced fracture of shaft of right clavicle, initial encounter for closed fracture: Secondary | ICD-10-CM

## 2015-06-18 DIAGNOSIS — S42023A Displaced fracture of shaft of unspecified clavicle, initial encounter for closed fracture: Secondary | ICD-10-CM | POA: Insufficient documentation

## 2015-06-18 MED ORDER — OXYCODONE HCL 5 MG PO CAPS: ORAL_CAPSULE | ORAL | Status: DC

## 2015-06-18 NOTE — Progress Notes (Addendum)
Subjective:    Patient ID: [name], male    DOB: [birth year], 65 y.o.   MRN: [id number]  Shoulder Injury  The incident occurred at home. The right shoulder is affected. The incident occurred 3 to 5 days ago. The injury mechanism was a fall. The quality of the pain is described as aching and burning. The pain radiates to the right arm. The pain is at a severity of 5/10. The pain is moderate. Pertinent negatives include no chest pain, muscle weakness, numbness or tingling. The symptoms are aggravated by overhead lifting. He has tried ice, immobilization and rest for the symptoms. The treatment provided moderate relief.   He was sitting on a bar stool and fell off and hurt his right clavicle.  He was seen in the ER on 06-16-15 shortly after the injury.  He has a midshaft clavicle fracture on the right.  He was given a sling.  He was given pain medicine.  He has no other injury.  He has chronic pain syndrome and has been taking oxycodone 30 every eight hours.  This is managed at [facility].  I told him to contact his pain doctor.  I will given oxycodone 5, number 20, for breakthrough pain this week.  He is to sleep semi-erect with sling on.  He is to use ice.  It will take six to eight weeks to heal properly.    Review of Systems  Constitutional:       He has diabetes on insulin. He has hypertension. He has COPD He has current smoking history. He has chronic pain syndrome.  HENT: Negative for congestion.   Respiratory: Positive for cough and shortness of breath.   Cardiovascular: Negative for chest pain.  Endocrine: Negative for cold intolerance.  Musculoskeletal: Positive for myalgias and arthralgias.  Allergic/Immunologic: Positive for environmental allergies.  Neurological: Negative for tingling and numbness.   The patient has a family history of hypertension Social History   Social History  . Marital Status: Divorced    Spouse Name: N/A  . Number of Children: 7  . Years  of Education: N/A   Occupational History  . UNEMPLOYED    Social History Main Topics  . Smoking status: Current Every Day Smoker -- 1.00 packs/day for 40 years    Types: Cigarettes  . Smokeless tobacco: Never Used  . Alcohol Use: No  . Drug Use: No  . Sexual Activity: Not on file   Other Topics Concern  . Not on file   Social History Narrative      Past Surgical History  Procedure Laterality Date  . Appendectomy    . Neck surgery    . Back surgery    . Hemorrhoid surgery    . Vasectomy     BP 157/68 mmHg  Pulse 85  Temp(Src) 98.1 F (36.7 C)  Ht '5\' 11"'$  (1.803 m)  Wt 208 lb 3.2 oz (94.439 kg)  BMI 29.05 kg/m2   Objective:   Physical Exam  Constitutional: He is oriented to person, place, and time. He appears well-developed and well-nourished.  HENT:  Head: Normocephalic and atraumatic.  Eyes: Conjunctivae and EOM are normal. Pupils are equal, round, and reactive to light.  Neck: Normal range of motion. Neck supple.  Cardiovascular: Normal rate, regular rhythm and normal heart sounds.   Pulmonary/Chest: Effort normal and breath sounds normal.  Abdominal: Soft.  Musculoskeletal: He exhibits tenderness (Pain right shoulder and mid shaft clavicle with swelling over anterior clavicle on the  right).       Right shoulder: He exhibits decreased range of motion, tenderness, bony tenderness, swelling, deformity and pain.       Arms: Neurological: He is alert and oriented to person, place, and time. He has normal reflexes. He displays normal reflexes. No cranial nerve deficit. He exhibits normal muscle tone. Coordination normal.  Skin: Skin is warm and dry.  Psychiatric: He has a normal mood and affect. His behavior is normal. Judgment and thought content normal.   He has multiple medical problems.  I have documented about pain medicine above.  He has diabetes well controlled.  He had labs done on 05-07-15 and does not remember his A1C.  His wife says the blood sugars are  fine.  His hypertension is controlled.  He watches what he eats and has no distal edema.  Encounter Diagnosis  Name Primary?  . Fracture of shaft of clavicle, right, closed, initial encounter Yes        Assessment & Plan:  Acute fracture of the mid shaft of the right clavicle.  Continue sling.  Take pain medicine as directed.  Return in one week.  Sleep semi-erect

## 2015-06-24 ENCOUNTER — Other Ambulatory Visit: Payer: Self-pay | Admitting: Family Medicine

## 2015-06-25 ENCOUNTER — Ambulatory Visit: Payer: Medicare Other | Admitting: Orthopaedic Surgery

## 2015-06-25 ENCOUNTER — Ambulatory Visit (INDEPENDENT_AMBULATORY_CARE_PROVIDER_SITE_OTHER): Payer: Medicare Other

## 2015-06-25 VITALS — BP 135/72 | HR 79 | Ht 71.0 in | Wt 207.2 lb

## 2015-06-25 DIAGNOSIS — S42021A Displaced fracture of shaft of right clavicle, initial encounter for closed fracture: Secondary | ICD-10-CM

## 2015-06-25 NOTE — Patient Instructions (Signed)
X-rays of clavicle on return.

## 2015-06-25 NOTE — Progress Notes (Signed)
CC:  My collar bone still hurts some  He has been using his sling.  He has no new trauma.  He says his pain is under control.  He sees a pain doctor and did not get my medicine filled.  NV is intact.  He still has ecchymosis over the mid right clavicle site.  X-rays were done.  Return in three weeks with x-rays of the clavicle then.

## 2015-07-09 ENCOUNTER — Ambulatory Visit: Payer: Medicare Other | Admitting: Orthopaedic Surgery

## 2015-07-09 ENCOUNTER — Ambulatory Visit (INDEPENDENT_AMBULATORY_CARE_PROVIDER_SITE_OTHER): Payer: Medicare Other

## 2015-07-09 ENCOUNTER — Encounter: Payer: Self-pay | Admitting: Orthopaedic Surgery

## 2015-07-09 VITALS — BP 156/78 | HR 84 | Temp 97.0°F | Resp 16 | Ht 71.0 in | Wt 207.0 lb

## 2015-07-09 DIAGNOSIS — S42001D Fracture of unspecified part of right clavicle, subsequent encounter for fracture with routine healing: Secondary | ICD-10-CM | POA: Diagnosis not present

## 2015-07-09 NOTE — Patient Instructions (Signed)
Smoking Cessation, Tips for Success If you are ready to quit smoking, congratulations! You have chosen to help yourself be healthier. Cigarettes bring nicotine, tar, carbon monoxide, and other irritants into your body. Your lungs, heart, and blood vessels will be able to work better without these poisons. There are many different ways to quit smoking. Nicotine gum, nicotine patches, a nicotine inhaler, or nicotine nasal spray can help with physical craving. Hypnosis, support groups, and medicines help break the habit of smoking. WHAT THINGS CAN I DO TO MAKE QUITTING EASIER?  Here are some tips to help you quit for good:  Pick a date when you will quit smoking completely. Tell all of your friends and family about your plan to quit on that date.  Do not try to slowly cut down on the number of cigarettes you are smoking. Pick a quit date and quit smoking completely starting on that day.  Throw away all cigarettes.   Clean and remove all ashtrays from your home, work, and car.  On a card, write down your reasons for quitting. Carry the card with you and read it when you get the urge to smoke.  Cleanse your body of nicotine. Drink enough water and fluids to keep your urine clear or pale yellow. Do this after quitting to flush the nicotine from your body.  Learn to predict your moods. Do not let a bad situation be your excuse to have a cigarette. Some situations in your life might tempt you into wanting a cigarette.  Never have "just one" cigarette. It leads to wanting another and another. Remind yourself of your decision to quit.  Change habits associated with smoking. If you smoked while driving or when feeling stressed, try other activities to replace smoking. Stand up when drinking your coffee. Brush your teeth after eating. Sit in a different chair when you read the paper. Avoid alcohol while trying to quit, and try to drink fewer caffeinated beverages. Alcohol and caffeine may urge you to  smoke.  Avoid foods and drinks that can trigger a desire to smoke, such as sugary or spicy foods and alcohol.  Ask people who smoke not to smoke around you.  Have something planned to do right after eating or having a cup of coffee. For example, plan to take a walk or exercise.  Try a relaxation exercise to calm you down and decrease your stress. Remember, you may be tense and nervous for the first 2 weeks after you quit, but this will pass.  Find new activities to keep your hands busy. Play with a pen, coin, or rubber band. Doodle or draw things on paper.  Brush your teeth right after eating. This will help cut down on the craving for the taste of tobacco after meals. You can also try mouthwash.   Use oral substitutes in place of cigarettes. Try using lemon drops, carrots, cinnamon sticks, or chewing gum. Keep them handy so they are available when you have the urge to smoke.  When you have the urge to smoke, try deep breathing.  Designate your home as a nonsmoking area.  If you are a heavy smoker, ask your health care provider about a prescription for nicotine chewing gum. It can ease your withdrawal from nicotine.  Reward yourself. Set aside the cigarette money you save and buy yourself something nice.  Look for support from others. Join a support group or smoking cessation program. Ask someone at home or at work to help you with your plan   to quit smoking.  Always ask yourself, "Do I need this cigarette or is this just a reflex?" Tell yourself, "Today, I choose not to smoke," or "I do not want to smoke." You are reminding yourself of your decision to quit.  Do not replace cigarette smoking with electronic cigarettes (commonly called e-cigarettes). The safety of e-cigarettes is unknown, and some may contain harmful chemicals.  If you relapse, do not give up! Plan ahead and think about what you will do the next time you get the urge to smoke. HOW WILL I FEEL WHEN I QUIT SMOKING? You  may have symptoms of withdrawal because your body is used to nicotine (the addictive substance in cigarettes). You may crave cigarettes, be irritable, feel very hungry, cough often, get headaches, or have difficulty concentrating. The withdrawal symptoms are only temporary. They are strongest when you first quit but will go away within 10-14 days. When withdrawal symptoms occur, stay in control. Think about your reasons for quitting. Remind yourself that these are signs that your body is healing and getting used to being without cigarettes. Remember that withdrawal symptoms are easier to treat than the major diseases that smoking can cause.  Even after the withdrawal is over, expect periodic urges to smoke. However, these cravings are generally short lived and will go away whether you smoke or not. Do not smoke! WHAT RESOURCES ARE AVAILABLE TO HELP ME QUIT SMOKING? Your health care provider can direct you to community resources or hospitals for support, which may include:  Group support.  Education.  Hypnosis.  Therapy.   This information is not intended to replace advice given to you by your health care provider. Make sure you discuss any questions you have with your health care provider.   Document Released: 01/03/2004 Document Revised: 04/27/2014 Document Reviewed: 09/22/2012 Elsevier Interactive Patient Education 2016 [employer]  

## 2015-07-09 NOTE — Progress Notes (Signed)
CC:  My shoulder is less tender  He has been wearing his sling on the right.  He has less pain.  He has no numbness or redness.  NV is intact.  I have shown him circumduction exercises to do.  Return in three weeks with x-rays of the right clavicle at that time.

## 2015-07-18 ENCOUNTER — Ambulatory Visit (INDEPENDENT_AMBULATORY_CARE_PROVIDER_SITE_OTHER): Payer: Medicare Other | Admitting: Family Medicine

## 2015-07-18 ENCOUNTER — Encounter: Payer: Self-pay | Admitting: Family Medicine

## 2015-07-18 VITALS — BP 160/85 | HR 78 | Temp 98.0°F | Ht 71.0 in | Wt 205.4 lb

## 2015-07-18 DIAGNOSIS — J441 Chronic obstructive pulmonary disease with (acute) exacerbation: Secondary | ICD-10-CM

## 2015-07-18 MED ORDER — PREDNISONE 20 MG PO TABS: ORAL_TABLET | ORAL | Status: DC

## 2015-07-18 MED ORDER — DOXYCYCLINE HYCLATE 100 MG PO TABS
100.0000 mg | ORAL_TABLET | Freq: Two times a day (BID) | ORAL | Status: DC
Start: 2015-07-18 — End: 2015-07-27

## 2015-07-18 MED ORDER — ALBUTEROL SULFATE HFA 108 (90 BASE) MCG/ACT IN AERS: 2.0000 | INHALATION_SPRAY | RESPIRATORY_TRACT | Status: AC | PRN

## 2015-07-18 MED ORDER — UMECLIDINIUM BROMIDE 62.5 MCG/INH IN AEPB
1.0000 | INHALATION_SPRAY | Freq: Every day | RESPIRATORY_TRACT | Status: DC
Start: 2015-07-18 — End: 2015-08-13

## 2015-07-18 MED ORDER — FLUTICASONE FUROATE-VILANTEROL 100-25 MCG/INH IN AEPB: 1.0000 | INHALATION_SPRAY | Freq: Every day | RESPIRATORY_TRACT | Status: AC

## 2015-07-18 NOTE — Progress Notes (Signed)
BP 160/85 mmHg  Pulse 78  Temp(Src) 98 F (36.7 C) (Oral)  Ht '5\' 11"'$  (1.803 m)  Wt 205 lb 6.4 oz (93.169 kg)  BMI 28.66 kg/m2  SpO2 95%   Subjective:    Patient ID: [name], male    DOB: Jun 01, 1950, 65 y.o.   MRN: [id number]  HPI: [name] is a 65 y.o. male presenting on 07/18/2015 for Sinusitis; Shortness of Breath; and Cough   HPI Sinus congestion and cough and shortness of breath and wheezing Patient has been having sinus congestion and wheezing and worsening over the past couple weeks. He is continue using his Breo and albuterol has had to use for 4- 5 times daily. He denies any fevers or chills. No wheezing and shortness of breath makes it so he hasn't been able to walk across the room without feeling short of breath. His wife and him not a pulse ox to use at home and say his oxygen that sometimes during the day and at night is dipping down into the low 80s and even into 79% once.  Relevant past medical, surgical, family and social history reviewed and updated as indicated. Interim medical history since our last visit reviewed. Allergies and medications reviewed and updated.  Review of Systems  Constitutional: Negative for fever and chills.  HENT: Positive for congestion, postnasal drip, rhinorrhea, sinus pressure, sneezing and sore throat. Negative for ear discharge, ear pain and voice change.   Eyes: Negative for pain, discharge, redness and visual disturbance.  Respiratory: Positive for cough, shortness of breath and wheezing. Negative for chest tightness.   Cardiovascular: Negative for chest pain and leg swelling.  Gastrointestinal: Negative for abdominal pain, diarrhea and constipation.  Genitourinary: Negative for difficulty urinating.  Musculoskeletal: Negative for back pain and gait problem.  Skin: Negative for rash.  Neurological: Negative for syncope, light-headedness and headaches.  All other systems reviewed and are negative.   Per HPI unless  specifically indicated above     Medication List       This list is accurate as of: 07/18/15 11:15 AM.  Always use your most recent med list.               albuterol 108 (90 Base) MCG/ACT inhaler  Commonly known as:  PROVENTIL HFA;VENTOLIN HFA  Inhale 2 puffs into the lungs every 4 (four) hours as needed for wheezing or shortness of breath.     amLODipine 10 MG tablet  Commonly known as:  NORVASC  Take 10 mg by mouth daily.     atenolol 100 MG tablet  Commonly known as:  TENORMIN  Take 1 tablet (100 mg total) by mouth daily.     atorvastatin 20 MG tablet  Commonly known as:  LIPITOR  Take 1 tablet (20 mg total) by mouth daily.     buPROPion 150 MG 24 hr tablet  Commonly known as:  WELLBUTRIN XL  Take 1 tablet (150 mg total) by mouth daily.     doxycycline 100 MG tablet  Commonly known as:  VIBRA-TABS  Take 1 tablet (100 mg total) by mouth 2 (two) times daily. 1 po bid     fluticasone 50 MCG/ACT nasal spray  Commonly known as:  FLONASE  Place 1 spray into both nostrils 2 (two) times daily as needed for allergies or rhinitis.     fluticasone furoate-vilanterol 100-25 MCG/INH Aepb  Commonly known as:  BREO ELLIPTA  Inhale 1 puff into the lungs daily.  gabapentin 600 MG tablet  Commonly known as:  NEURONTIN  TAKE TWO (2) TABLETS THREE (3) TIMES DAILY     gemfibrozil 600 MG tablet  Commonly known as:  LOPID  Take 600 mg by mouth 2 (two) times daily before a meal.     Insulin Degludec 200 UNIT/ML Sopn  Commonly known as:  TRESIBA FLEXTOUCH  Inject 95 Units into the skin daily.     lisinopril-hydrochlorothiazide 20-12.5 MG tablet  Commonly known as:  PRINZIDE,ZESTORETIC  Take 2 tablets by mouth daily.     metFORMIN 1000 MG tablet  Commonly known as:  GLUCOPHAGE  Take 1,000 mg by mouth 2 (two) times daily with a meal.     NOVOLOG FLEXPEN 100 UNIT/ML FlexPen  Generic drug:  insulin aspart  INJECT UP TO 40 UNITS UP TO FOUR TIMES A DAY     omeprazole 40 MG  capsule  Commonly known as:  PRILOSEC  Take 40 mg by mouth daily.     oxyCODONE-acetaminophen 5-325 MG tablet  Commonly known as:  PERCOCET/ROXICET  Take 1 tablet by mouth every 6 (six) hours as needed.     OXYCONTIN 30 MG 12 hr tablet  Generic drug:  oxyCODONE  Take 30 mg by mouth 2 (two) times daily.     oxycodone 5 MG capsule  Commonly known as:  OXY-IR  Use every four to six hours for breakthrough pain for new clavicle fracture.     Pen Needles 31G X 6 MM Misc  1 each by Does not apply route 4 (four) times daily. Use with insulin pens to inject insulin up to 5 times daily     predniSONE 20 MG tablet  Commonly known as:  DELTASONE  2 po at same time daily for 5 days     sitaGLIPtin 100 MG tablet  Commonly known as:  JANUVIA  Take 1 tablet (100 mg total) by mouth daily.     tiZANidine 4 MG tablet  Commonly known as:  ZANAFLEX  Take 4 mg by mouth 2 (two) times daily as needed for muscle spasms.     umeclidinium bromide 62.5 MCG/INH Aepb  Commonly known as:  INCRUSE ELLIPTA  Inhale 1 puff into the lungs daily.           Objective:    BP 160/85 mmHg  Pulse 78  Temp(Src) 98 F (36.7 C) (Oral)  Ht '5\' 11"'$  (1.803 m)  Wt 205 lb 6.4 oz (93.169 kg)  BMI 28.66 kg/m2  SpO2 95%  Wt Readings from Last 3 Encounters:  07/18/15 205 lb 6.4 oz (93.169 kg)  07/09/15 207 lb (93.895 kg)  06/25/15 207 lb 3.2 oz (93.985 kg)    Physical Exam  Constitutional: He is oriented to person, place, and time. He appears well-developed and well-nourished. No distress.  HENT:  Right Ear: Tympanic membrane, external ear and ear canal normal.  Left Ear: Tympanic membrane, external ear and ear canal normal.  Nose: Mucosal edema and rhinorrhea present. No sinus tenderness. No epistaxis. Right sinus exhibits maxillary sinus tenderness. Right sinus exhibits no frontal sinus tenderness. Left sinus exhibits maxillary sinus tenderness. Left sinus exhibits no frontal sinus tenderness.  Mouth/Throat:  Uvula is midline and mucous membranes are normal. Posterior oropharyngeal edema and posterior oropharyngeal erythema present. No oropharyngeal exudate or tonsillar abscesses.  Eyes: Conjunctivae and EOM are normal. Pupils are equal, round, and reactive to light. Right eye exhibits no discharge. No scleral icterus.  Neck: Neck supple. No thyromegaly present.  Cardiovascular:  Normal rate, regular rhythm, normal heart sounds and intact distal pulses.   No murmur heard. Pulmonary/Chest: Effort normal. No respiratory distress. He has wheezes. He has no rales.  Musculoskeletal: Normal range of motion. He exhibits no edema.  Lymphadenopathy:    He has no cervical adenopathy.  Neurological: He is alert and oriented to person, place, and time. Coordination normal.  Skin: Skin is warm and dry. No rash noted. He is not diaphoretic.  Psychiatric: He has a normal mood and affect. His behavior is normal.  Nursing note and vitals reviewed.     Assessment & Plan:   Problem List Items Addressed This Visit    None    Visit Diagnoses    COPD exacerbation ([place])    -  Primary    Relevant Medications    predniSONE (DELTASONE) 20 MG tablet    doxycycline (VIBRA-TABS) 100 MG tablet    albuterol (PROVENTIL HFA;VENTOLIN HFA) 108 (90 Base) MCG/ACT inhaler    umeclidinium bromide (INCRUSE ELLIPTA) 62.5 MCG/INH AEPB    fluticasone furoate-vilanterol (BREO ELLIPTA) 100-25 MCG/INH AEPB    Other Relevant Orders    Ambulatory referral to Pulmonology       Follow up plan: Return in about 2 weeks (around 08/01/2015), or if symptoms worsen or fail to improve, for Follow-up COPD.  Counseling provided for all of the vaccine components Orders Placed This Encounter  Procedures  . Ambulatory referral to [place], MD [facility] 07/18/2015, 11:15 AM

## 2015-07-23 ENCOUNTER — Inpatient Hospital Stay (HOSPITAL_COMMUNITY)
Admission: EM | Admit: 2015-07-23 | Discharge: 2015-07-27 | DRG: 190 | Disposition: A | Discharge status: Home / Self Care | Payer: Medicare Other | Attending: Internal Medicine | Admitting: Internal Medicine

## 2015-07-23 ENCOUNTER — Encounter (HOSPITAL_COMMUNITY): Payer: Self-pay | Admitting: Emergency Medicine

## 2015-07-23 ENCOUNTER — Ambulatory Visit: Payer: Medicare Other | Admitting: Orthopaedic Surgery

## 2015-07-23 ENCOUNTER — Emergency Department (HOSPITAL_COMMUNITY): Payer: Medicare Other

## 2015-07-23 ENCOUNTER — Other Ambulatory Visit: Payer: Self-pay | Admitting: Family Medicine

## 2015-07-23 DIAGNOSIS — G894 Chronic pain syndrome: Secondary | ICD-10-CM | POA: Diagnosis present

## 2015-07-23 DIAGNOSIS — E119 Type 2 diabetes mellitus without complications: Secondary | ICD-10-CM

## 2015-07-23 DIAGNOSIS — C3490 Malignant neoplasm of unspecified part of unspecified bronchus or lung: Secondary | ICD-10-CM | POA: Diagnosis present

## 2015-07-23 DIAGNOSIS — D649 Anemia, unspecified: Secondary | ICD-10-CM | POA: Diagnosis present

## 2015-07-23 DIAGNOSIS — F329 Major depressive disorder, single episode, unspecified: Secondary | ICD-10-CM | POA: Diagnosis present

## 2015-07-23 DIAGNOSIS — E1151 Type 2 diabetes mellitus with diabetic peripheral angiopathy without gangrene: Secondary | ICD-10-CM | POA: Diagnosis present

## 2015-07-23 DIAGNOSIS — K219 Gastro-esophageal reflux disease without esophagitis: Secondary | ICD-10-CM | POA: Diagnosis present

## 2015-07-23 DIAGNOSIS — G8929 Other chronic pain: Secondary | ICD-10-CM

## 2015-07-23 DIAGNOSIS — J189 Pneumonia, unspecified organism: Secondary | ICD-10-CM | POA: Diagnosis present

## 2015-07-23 DIAGNOSIS — M549 Dorsalgia, unspecified: Secondary | ICD-10-CM | POA: Diagnosis present

## 2015-07-23 DIAGNOSIS — F419 Anxiety disorder, unspecified: Secondary | ICD-10-CM | POA: Diagnosis present

## 2015-07-23 DIAGNOSIS — R634 Abnormal weight loss: Secondary | ICD-10-CM

## 2015-07-23 DIAGNOSIS — R0902 Hypoxemia: Secondary | ICD-10-CM | POA: Diagnosis present

## 2015-07-23 DIAGNOSIS — I739 Peripheral vascular disease, unspecified: Secondary | ICD-10-CM | POA: Diagnosis present

## 2015-07-23 DIAGNOSIS — E1140 Type 2 diabetes mellitus with diabetic neuropathy, unspecified: Secondary | ICD-10-CM | POA: Diagnosis present

## 2015-07-23 DIAGNOSIS — K222 Esophageal obstruction: Secondary | ICD-10-CM

## 2015-07-23 DIAGNOSIS — Z79899 Other long term (current) drug therapy: Secondary | ICD-10-CM

## 2015-07-23 DIAGNOSIS — J441 Chronic obstructive pulmonary disease with (acute) exacerbation: Secondary | ICD-10-CM | POA: Diagnosis present

## 2015-07-23 DIAGNOSIS — I2510 Atherosclerotic heart disease of native coronary artery without angina pectoris: Secondary | ICD-10-CM | POA: Diagnosis present

## 2015-07-23 DIAGNOSIS — R0789 Other chest pain: Secondary | ICD-10-CM | POA: Diagnosis not present

## 2015-07-23 DIAGNOSIS — K921 Melena: Secondary | ICD-10-CM | POA: Diagnosis present

## 2015-07-23 DIAGNOSIS — C4491 Basal cell carcinoma of skin, unspecified: Secondary | ICD-10-CM

## 2015-07-23 DIAGNOSIS — Z794 Long term (current) use of insulin: Secondary | ICD-10-CM

## 2015-07-23 DIAGNOSIS — K760 Fatty (change of) liver, not elsewhere classified: Secondary | ICD-10-CM | POA: Diagnosis present

## 2015-07-23 DIAGNOSIS — I10 Essential (primary) hypertension: Secondary | ICD-10-CM | POA: Diagnosis present

## 2015-07-23 DIAGNOSIS — Z8614 Personal history of Methicillin resistant Staphylococcus aureus infection: Secondary | ICD-10-CM

## 2015-07-23 DIAGNOSIS — K922 Gastrointestinal hemorrhage, unspecified: Secondary | ICD-10-CM

## 2015-07-23 DIAGNOSIS — Z85828 Personal history of other malignant neoplasm of skin: Secondary | ICD-10-CM

## 2015-07-23 DIAGNOSIS — J440 Chronic obstructive pulmonary disease with acute lower respiratory infection: Secondary | ICD-10-CM | POA: Diagnosis not present

## 2015-07-23 DIAGNOSIS — R042 Hemoptysis: Secondary | ICD-10-CM

## 2015-07-23 DIAGNOSIS — R079 Chest pain, unspecified: Secondary | ICD-10-CM | POA: Diagnosis present

## 2015-07-23 DIAGNOSIS — G4733 Obstructive sleep apnea (adult) (pediatric): Secondary | ICD-10-CM | POA: Diagnosis present

## 2015-07-23 DIAGNOSIS — C3432 Malignant neoplasm of lower lobe, left bronchus or lung: Secondary | ICD-10-CM | POA: Insufficient documentation

## 2015-07-23 DIAGNOSIS — Z8711 Personal history of peptic ulcer disease: Secondary | ICD-10-CM

## 2015-07-23 DIAGNOSIS — F17210 Nicotine dependence, cigarettes, uncomplicated: Secondary | ICD-10-CM | POA: Diagnosis present

## 2015-07-23 DIAGNOSIS — R1314 Dysphagia, pharyngoesophageal phase: Secondary | ICD-10-CM | POA: Diagnosis present

## 2015-07-23 DIAGNOSIS — S42023A Displaced fracture of shaft of unspecified clavicle, initial encounter for closed fracture: Secondary | ICD-10-CM | POA: Diagnosis present

## 2015-07-23 DIAGNOSIS — F1190 Opioid use, unspecified, uncomplicated: Secondary | ICD-10-CM

## 2015-07-23 DIAGNOSIS — Z720 Tobacco use: Secondary | ICD-10-CM | POA: Diagnosis present

## 2015-07-23 DIAGNOSIS — Z79891 Long term (current) use of opiate analgesic: Secondary | ICD-10-CM

## 2015-07-23 DIAGNOSIS — R1310 Dysphagia, unspecified: Secondary | ICD-10-CM

## 2015-07-23 DIAGNOSIS — K439 Ventral hernia without obstruction or gangrene: Secondary | ICD-10-CM | POA: Diagnosis present

## 2015-07-23 DIAGNOSIS — E782 Mixed hyperlipidemia: Secondary | ICD-10-CM | POA: Diagnosis present

## 2015-07-23 LAB — BASIC METABOLIC PANEL
Anion gap: 11 (ref 5–15)
BUN: 26 mg/dL — ABNORMAL HIGH (ref 6–20)
CHLORIDE: 95 mmol/L — AB (ref 101–111)
CO2: 26 mmol/L (ref 22–32)
CREATININE: 1.17 mg/dL (ref 0.61–1.24)
Calcium: 8.6 mg/dL — ABNORMAL LOW (ref 8.9–10.3)
GFR calc non Af Amer: >60 mL/min (ref >60)
Glucose, Bld: 105 mg/dL — ABNORMAL HIGH (ref 65–99)
POTASSIUM: 3.8 mmol/L (ref 3.5–5.1)
SODIUM: 132 mmol/L — AB (ref 135–145)

## 2015-07-23 LAB — CBC WITH DIFFERENTIAL/PLATELET
BASOS PCT: 0 %
Basophils Absolute: 0 10*3/uL (ref 0.0–0.1)
Eosinophils Absolute: 0.1 10*3/uL (ref 0.0–0.7)
Eosinophils Relative: 1 %
HEMATOCRIT: 34.8 % — AB (ref 39.0–52.0)
HEMOGLOBIN: 11.5 g/dL — AB (ref 13.0–17.0)
LYMPHS ABS: 2.1 10*3/uL (ref 0.7–4.0)
Lymphocytes Relative: 15 %
MCH: 28.7 pg (ref 26.0–34.0)
MCHC: 33 g/dL (ref 30.0–36.0)
MCV: 86.8 fL (ref 78.0–100.0)
MONOS PCT: 12 %
Monocytes Absolute: 1.7 10*3/uL — ABNORMAL HIGH (ref 0.1–1.0)
NEUTROS ABS: 10 10*3/uL — AB (ref 1.7–7.7)
NEUTROS PCT: 72 %
Platelets: 197 10*3/uL (ref 150–400)
RBC: 4.01 MIL/uL — AB (ref 4.22–5.81)
RDW: 14.6 % (ref 11.5–15.5)
WBC: 13.9 10*3/uL — AB (ref 4.0–10.5)

## 2015-07-23 LAB — TROPONIN I

## 2015-07-23 LAB — BRAIN NATRIURETIC PEPTIDE: B Natriuretic Peptide: 12 pg/mL (ref 0.0–100.0)

## 2015-07-23 MED ORDER — METHYLPREDNISOLONE SODIUM SUCC 125 MG IJ SOLR
125.0000 mg | Freq: Once | INTRAMUSCULAR | Status: AC
  Administered 2015-07-23: 125 mg via INTRAVENOUS
  Filled 2015-07-23: qty 2

## 2015-07-23 MED ORDER — IPRATROPIUM-ALBUTEROL 0.5-2.5 (3) MG/3ML IN SOLN
3.0000 mL | Freq: Once | RESPIRATORY_TRACT | Status: AC
  Administered 2015-07-23: 3 mL via RESPIRATORY_TRACT
  Filled 2015-07-23: qty 3

## 2015-07-23 MED ORDER — LEVOFLOXACIN IN D5W 750 MG/150ML IV SOLN
750.0000 mg | Freq: Once | INTRAVENOUS | Status: AC
  Administered 2015-07-23: 750 mg via INTRAVENOUS
  Filled 2015-07-23: qty 150

## 2015-07-23 MED ORDER — IPRATROPIUM BROMIDE 0.02 % IN SOLN: 0.5000 mg | Freq: Once | RESPIRATORY_TRACT | Status: DC

## 2015-07-23 MED ORDER — ALBUTEROL SULFATE (2.5 MG/3ML) 0.083% IN NEBU: 5.0000 mg | INHALATION_SOLUTION | Freq: Once | RESPIRATORY_TRACT | Status: DC

## 2015-07-23 MED ORDER — NITROGLYCERIN 2 % TD OINT
1.0000 [in_us] | TOPICAL_OINTMENT | Freq: Once | TRANSDERMAL | Status: AC
  Administered 2015-07-23: 1 [in_us] via TOPICAL
  Filled 2015-07-23: qty 1

## 2015-07-23 MED ORDER — ASPIRIN 81 MG PO CHEW
324.0000 mg | CHEWABLE_TABLET | Freq: Once | ORAL | Status: AC
  Administered 2015-07-23: 324 mg via ORAL
  Filled 2015-07-23: qty 4

## 2015-07-23 MED ORDER — ALBUTEROL SULFATE (2.5 MG/3ML) 0.083% IN NEBU
2.5000 mg | INHALATION_SOLUTION | Freq: Once | RESPIRATORY_TRACT | Status: AC
  Administered 2015-07-23: 2.5 mg via RESPIRATORY_TRACT
  Filled 2015-07-23: qty 3

## 2015-07-23 MED ORDER — ACETAMINOPHEN 500 MG PO TABS
1000.0000 mg | ORAL_TABLET | Freq: Once | ORAL | Status: AC
Start: 2015-07-23 — End: 2015-07-23
  Administered 2015-07-23: 1000 mg via ORAL
  Filled 2015-07-23: qty 2

## 2015-07-23 NOTE — ED Notes (Signed)
Pt states that he has been having cough and SOB for several weeks, now states he is coughing up blood

## 2015-07-23 NOTE — ED Provider Notes (Signed)
CSN: [id number]     Arrival date & time 07/23/15  2053 History  By signing my name below, I, Dora Sims, attest that this documentation has been prepared under the direction and in the presence of physician practitioner, Rolland Porter, MD at 2309,. Electronically Signed: Dora Sims, Scribe. 07/23/2015. 11:03 PM.    Chief Complaint  Patient presents with  . Shortness of Breath  . Cough    The history is provided by the patient. No language interpreter was used.     HPI Comments: [name] is a 65 y.o. male with h/o DM type 2, cancer, and mixed HLD who presents to the Emergency Department complaining of sudden onset, intermittent, sharp, severe, chest pain beginning around 4PM this afternoon. He notes that his chest pain began in the center of his chest and lasted for around 30 minutes when it presented. Pt states that his chest pain radiates into his back. He notes that his chest currently hurts on the left side. He notes that his chest pain has not radiated into his bilateral arms. He states that increased activity exacerbates his chest pain and decreasing activity alleviates his chest pain. He notes that he has experienced intermittent chest pain for years that has worsened significantly recently. He denies chest pain exacerbation with palpation. Pt reports that his chest pain became intense today so he decided to come into the ER.   Pt also complains of associated intermittent SOB and wheezing for the last 6 weeks. He notes that his SOB has worsened recently and is exacerbated when he lays down. He has told his PCP about his chest pain; he does not have a cardiologist. He notes that he sees a pulmonary specialist with [facility]; he has an appointment in 3 days (actually his first appt) Pt states that he recently purchased a device to measure his oxygen; he measured his oxygen at 35 yesterday and states that his oxygen is 93-94 at baseline. Pt notes that he measured his oxygen at 74  at its lowest. Pt uses inhalers at home.   He also complains of hemoptysis for the last 6 weeks. He notes that his hemoptysis initially began as streaks, but now is completely red and filled with blood. Pt states that he recently switched to e-cigarettes around 1 month ago. He is retired and on disability due to back issues. Pt has FMHx of heart problems; his maternal grandfather died around age 64 from MI and his mother had a heart attack but did not die because of it; she died in her 80's. He reports that he experienced diaphoresis this evening around 6PM because his blood sugar dropped to 54; he recently quit using prednisone because it causes his blood sugar to spike. Pt denies fever, leg swelling, nausea, vomiting, or any other associated symptoms. Pt has nicotine stains on his right fingers for a month.  Patient was seen by his PCP last week and was started on doxycycline on March 30, he states he still has 4 more days of antibiotics. He also had a short course of prednisone.  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   Past Medical History  Diagnosis Date  . Essential hypertension, benign   . Type 2 diabetes mellitus ([place])   . Mixed hyperlipidemia   . OSA (obstructive sleep apnea)   . Partial small bowel obstruction ([place])   . 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 Coronary atherosclerosis of native coronary artery  Nonobstructive  . Erythrocytosis   . Peripheral arterial disease ([facility])     70% right iliac  . History of MRSA infection   . Cancer ([place]) 2015    skin  . Neuropathy ([place]) 2013   Past Surgical History  Procedure Laterality Date  . Appendectomy    . Neck surgery    . Back surgery    . Hemorrhoid surgery    . Vasectomy     Family History  Problem Relation Age of Onset  . Coronary artery disease    . Hypertension    . Cancer Mother   . Depression Mother   . Diabetes Mother   . Heart disease Maternal Grandfather   . Hyperlipidemia Maternal  Grandfather   . Hypertension Maternal Grandfather    Social History  Substance Use Topics  . Smoking status: Current Every Day Smoker -- 1.00 packs/day for 40 years    Types: Cigarettes  . Smokeless tobacco: Never Used  . Alcohol Use: No  on disability for his back Lives with spouse Uses CPAP at night Uses e cigarettes, denies regular cigarettes for the past month, but wife shakes her head to indicate he is still smoking   Review of Systems  Constitutional: Negative for fever.  Respiratory: Positive for cough (hemoptysis), shortness of breath and wheezing.   Cardiovascular: Positive for chest pain. Negative for leg swelling.  Gastrointestinal: Negative for nausea and vomiting.  All other systems reviewed and are negative.     Allergies  Nsaids; Prednisone; and Vancomycin  Home Medications   Prior to Admission medications   Medication Sig Start Date End Date Taking? Authorizing Provider  albuterol (PROVENTIL HFA;VENTOLIN HFA) 108 (90 Base) MCG/ACT inhaler Inhale 2 puffs into the lungs every 4 (four) hours as needed for wheezing or shortness of breath. 07/18/15   Fransisca Kaufmann Dettinger, MD  amLODipine (NORVASC) 10 MG tablet Take 10 mg by mouth daily.    Historical Provider, MD  atenolol (TENORMIN) 100 MG tablet Take 1 tablet (100 mg total) by mouth daily. 04/04/15   Fransisca Kaufmann Dettinger, MD  atorvastatin (LIPITOR) 20 MG tablet Take 1 tablet (20 mg total) by mouth daily. 04/04/15   Fransisca Kaufmann Dettinger, MD  buPROPion (WELLBUTRIN XL) 150 MG 24 hr tablet TAKE 1 TABLET BY MOUTH DAILY 07/23/15   Fransisca Kaufmann Dettinger, MD  doxycycline (VIBRA-TABS) 100 MG tablet Take 1 tablet (100 mg total) by mouth 2 (two) times daily. 1 po bid 07/18/15   Fransisca Kaufmann Dettinger, MD  fluticasone (FLONASE) 50 MCG/ACT nasal spray Place 1 spray into both nostrils 2 (two) times daily as needed for allergies or rhinitis. 05/15/15   Fransisca Kaufmann Dettinger, MD  fluticasone furoate-vilanterol (BREO ELLIPTA) 100-25 MCG/INH AEPB Inhale 1  puff into the lungs daily. 07/18/15   Fransisca Kaufmann Dettinger, MD  gabapentin (NEURONTIN) 600 MG tablet TAKE TWO (2) TABLETS THREE (3) TIMES DAILY 04/29/15   Fransisca Kaufmann Dettinger, MD  gemfibrozil (LOPID) 600 MG tablet Take 600 mg by mouth 2 (two) times daily before a meal.     Historical Provider, MD  Insulin Degludec (TRESIBA FLEXTOUCH) 200 UNIT/ML SOPN Inject 95 Units into the skin daily. 05/08/15   Fransisca Kaufmann Dettinger, MD  Insulin Pen Needle (PEN NEEDLES) 31G X 6 MM MISC 1 each by Does not apply route 4 (four) times daily. Use with insulin pens to inject insulin up to 5 times daily 03/20/15   Tammy Eckard, PHARMD  JANUVIA 100 MG tablet TAKE 1 TABLET BY MOUTH  DAILY 07/23/15   Fransisca Kaufmann Dettinger, MD  lisinopril-hydrochlorothiazide (PRINZIDE,ZESTORETIC) 20-12.5 MG tablet TAKE 2 TABLETS BY MOUTH DAILY. 07/23/15   Fransisca Kaufmann Dettinger, MD  metFORMIN (GLUCOPHAGE) 1000 MG tablet Take 1,000 mg by mouth 2 (two) times daily with a meal.  11/14/13   Historical Provider, MD  NOVOLOG FLEXPEN 100 UNIT/ML FlexPen INJECT UP TO 40 UNITS UP TO FOUR TIMES A DAY 06/24/15   Fransisca Kaufmann Dettinger, MD  omeprazole (PRILOSEC) 40 MG capsule Take 40 mg by mouth daily.     Historical Provider, MD  oxycodone (OXY-IR) 5 MG capsule Use every four to six hours for breakthrough pain for new clavicle fracture. 06/18/15   Sanjuana Kava, MD  oxyCODONE (OXYCONTIN) 30 MG 12 hr tablet Take 30 mg by mouth 2 (two) times daily.  01/07/15   Historical Provider, MD  oxyCODONE-acetaminophen (PERCOCET/ROXICET) 5-325 MG tablet Take 1 tablet by mouth every 6 (six) hours as needed. 06/16/15   Lily Kocher, PA-C  predniSONE (DELTASONE) 20 MG tablet 2 po at same time daily for 5 days 07/18/15   Fransisca Kaufmann Dettinger, MD  tiZANidine (ZANAFLEX) 4 MG tablet Take 4 mg by mouth 2 (two) times daily as needed for muscle spasms.    Historical Provider, MD  umeclidinium bromide (INCRUSE ELLIPTA) 62.5 MCG/INH AEPB Inhale 1 puff into the lungs daily. 07/18/15   Fransisca Kaufmann Dettinger, MD    BP 145/78 mmHg  Pulse 90  Temp(Src) 98 F (36.7 C) (Oral)  Resp 24  Ht '5\' 11"'$  (1.803 m)  Wt 200 lb (90.719 kg)  BMI 27.91 kg/m2  SpO2 98% Physical Exam  Constitutional: He is oriented to person, place, and time. He appears well-developed and well-nourished.  Non-toxic appearance. He does not appear ill. No distress.  HENT:  Head: Normocephalic and atraumatic.  Right Ear: External ear normal.  Left Ear: External ear normal.  Nose: Nose normal. No mucosal edema or rhinorrhea.  Mouth/Throat: Oropharynx is clear and moist and mucous membranes are normal. No dental abscesses or uvula swelling.  Eyes: Conjunctivae and EOM are normal. Pupils are equal, round, and reactive to light.  Neck: Normal range of motion and full passive range of motion without pain. Neck supple.  Cardiovascular: Normal rate, regular rhythm and normal heart sounds.  Exam reveals no gallop and no friction rub.   No murmur heard. Pulmonary/Chest: Effort normal and breath sounds normal. No respiratory distress. He has no wheezes. He has no rhonchi. He has no rales. He exhibits no tenderness and no crepitus.  Rales at the bases, but it cleared when he coughed Scattered rhonchi and wheezing  Abdominal: Soft. Normal appearance and bowel sounds are normal. He exhibits no distension. There is no tenderness. There is no rebound and no guarding.  Musculoskeletal: Normal range of motion. He exhibits no edema or tenderness.  Moves all extremities well.  Chronic skin changes of lower extremities with hyperpigmentation and scaliness of the skin  Neurological: He is alert and oriented to person, place, and time. He has normal strength. No cranial nerve deficit.  Skin: Skin is warm, dry and intact. No rash noted. No erythema. No pallor.  Psychiatric: He has a normal mood and affect. His speech is normal and behavior is normal. His mood appears not anxious.  Nursing note and vitals reviewed.   ED Course  Procedures (including  critical care time)  Medications  nitroGLYCERIN (NITROGLYN) 2 % ointment 1 inch (1 inch Topical Given 07/23/15 2350)  aspirin chewable tablet 324 mg (324  mg Oral Given 07/23/15 2347)  levofloxacin (LEVAQUIN) IVPB 750 mg (750 mg Intravenous New Bag/Given 07/23/15 2350)  methylPREDNISolone sodium succinate (SOLU-MEDROL) 125 mg/2 mL injection 125 mg (125 mg Intravenous Given 07/23/15 2346)  acetaminophen (TYLENOL) tablet 1,000 mg (1,000 mg Oral Given 07/23/15 2347)  ipratropium-albuterol (DUONEB) 0.5-2.5 (3) MG/3ML nebulizer solution 3 mL (3 mLs Nebulization Given 07/23/15 2333)  albuterol (PROVENTIL) (2.5 MG/3ML) 0.083% nebulizer solution 2.5 mg (2.5 mg Nebulization Given 07/23/15 2333)     DIAGNOSTIC STUDIES: Oxygen Saturation is 98% on RA, normal by my interpretation.    COORDINATION OF CARE: 11:03 PM Discussed treatment plan with pt at bedside and pt agreed to plan. Patient had nitroglycerin ointment placed on his chest and he was given full dose aspirin to chew. He was given IV Levaquin, he had already been on doxycycline for about a week. He was given IV Solu-Medrol and given a albuterol/Atrovent nebulizer treatment.  Patient was ambulated by nursing staff after his nebulizer treatment. His pulse ox was 90% on room air. He states it made him feel a little more short of breath.  Patient was rechecked at 00:40 a.m. We discussed his test results. I felt he should be admitted for pneumonia that has not responded to the doxycycline. He has had borderline hypoxia also. Patient is agreeable.  00:52 Dr Marin Comment, will see patient for admission.    Labs Review Results for orders placed or performed during the hospital encounter of 07/23/15  CBC with Differential  Result Value Ref Range   WBC 13.9 (H) 4.0 - 10.5 K/uL   RBC 4.01 (L) 4.22 - 5.81 MIL/uL   Hemoglobin 11.5 (L) 13.0 - 17.0 g/dL   HCT 34.8 (L) 39.0 - 52.0 %   MCV 86.8 78.0 - 100.0 fL   MCH 28.7 26.0 - 34.0 pg   MCHC 33.0 30.0 - 36.0 g/dL   RDW  14.6 11.5 - 15.5 %   Platelets 197 150 - 400 K/uL   Neutrophils Relative % 72 %   Neutro Abs 10.0 (H) 1.7 - 7.7 K/uL   Lymphocytes Relative 15 %   Lymphs Abs 2.1 0.7 - 4.0 K/uL   Monocytes Relative 12 %   Monocytes Absolute 1.7 (H) 0.1 - 1.0 K/uL   Eosinophils Relative 1 %   Eosinophils Absolute 0.1 0.0 - 0.7 K/uL   Basophils Relative 0 %   Basophils Absolute 0.0 0.0 - 0.1 K/uL  Basic metabolic panel  Result Value Ref Range   Sodium 132 (L) 135 - 145 mmol/L   Potassium 3.8 3.5 - 5.1 mmol/L   Chloride 95 (L) 101 - 111 mmol/L   CO2 26 22 - 32 mmol/L   Glucose, Bld 105 (H) 65 - 99 mg/dL   BUN 26 (H) 6 - 20 mg/dL   Creatinine, Ser 1.17 0.61 - 1.24 mg/dL   Calcium 8.6 (L) 8.9 - 10.3 mg/dL   GFR calc non Af Amer >60 >60 mL/min   GFR calc Af Amer >60 >60 mL/min   Anion gap 11 5 - 15  Troponin I  Result Value Ref Range   Troponin I <0.03 <0.031 ng/mL  Brain natriuretic peptide  Result Value Ref Range   B Natriuretic Peptide 12.0 0.0 - 100.0 pg/mL   Laboratory interpretation all normal except for mildly worse chronic hyponatremia, elevated BUN, anemia that has developed over the last 6 months   Imaging Review Dg Chest 2 View  07/23/2015  CLINICAL DATA:  Cough and dyspnea for several weeks. Hemoptysis  tonight. EXAM: CHEST  2 VIEW COMPARISON:  06/16/2015 FINDINGS: There is mild hyperinflation. There is moderate vascular and interstitial prominence which has worsened. There is increased interstitial fluid or thickening in the basilar periphery. There is worsened alveolar opacity in the lingula. These findings may represent congestive heart failure with asymmetric alveolar edema. There is no pleural effusion. IMPRESSION: Worsened vascular and interstitial changes. Worsened lingular alveolar opacity. This may represent congestive failure but an infectious infiltrate is not entirely excluded. Electronically Signed   By: Andreas Newport M.D.   On: 07/23/2015 21:21   I have personally reviewed  and evaluated these images and lab results as part of my medical decision-making.   EKG Interpretation   Date/Time:  Tuesday July 23 2015 22:17:30 EDT Ventricular Rate:  78 PR Interval:  195 QRS Duration: 98 QT Interval:  376 QTC Calculation: 428 R Axis:   50 Text Interpretation:  Sinus rhythm No significant change since last  tracing Confirmed by Rhett Mutschler  MD-J, JON ([id number]) on 07/23/2015 10:22:02 PM      MDM   Final diagnoses:  CAP (community acquired pneumonia)  COPD exacerbation ([place])  Atypical chest pain  Hypoxia    Plan admission  Rolland Porter, MD, FACEP   I personally performed the services described in this documentation, which was scribed in my presence. The recorded information has been reviewed and considered.     Rolland Porter, MD 07/24/15 [id number]

## 2015-07-24 ENCOUNTER — Encounter (HOSPITAL_COMMUNITY): Payer: Self-pay | Admitting: Internal Medicine

## 2015-07-24 ENCOUNTER — Emergency Department (HOSPITAL_COMMUNITY): Payer: Medicare Other

## 2015-07-24 DIAGNOSIS — J441 Chronic obstructive pulmonary disease with (acute) exacerbation: Secondary | ICD-10-CM | POA: Diagnosis present

## 2015-07-24 DIAGNOSIS — C3432 Malignant neoplasm of lower lobe, left bronchus or lung: Secondary | ICD-10-CM | POA: Insufficient documentation

## 2015-07-24 DIAGNOSIS — R042 Hemoptysis: Secondary | ICD-10-CM | POA: Insufficient documentation

## 2015-07-24 DIAGNOSIS — R079 Chest pain, unspecified: Secondary | ICD-10-CM

## 2015-07-24 DIAGNOSIS — R0902 Hypoxemia: Secondary | ICD-10-CM | POA: Diagnosis present

## 2015-07-24 DIAGNOSIS — R1314 Dysphagia, pharyngoesophageal phase: Secondary | ICD-10-CM | POA: Diagnosis present

## 2015-07-24 DIAGNOSIS — I10 Essential (primary) hypertension: Secondary | ICD-10-CM

## 2015-07-24 DIAGNOSIS — E1140 Type 2 diabetes mellitus with diabetic neuropathy, unspecified: Secondary | ICD-10-CM | POA: Diagnosis present

## 2015-07-24 DIAGNOSIS — G4733 Obstructive sleep apnea (adult) (pediatric): Secondary | ICD-10-CM | POA: Diagnosis not present

## 2015-07-24 DIAGNOSIS — D649 Anemia, unspecified: Secondary | ICD-10-CM | POA: Diagnosis present

## 2015-07-24 DIAGNOSIS — K760 Fatty (change of) liver, not elsewhere classified: Secondary | ICD-10-CM | POA: Diagnosis present

## 2015-07-24 DIAGNOSIS — R918 Other nonspecific abnormal finding of lung field: Secondary | ICD-10-CM | POA: Diagnosis not present

## 2015-07-24 DIAGNOSIS — F17210 Nicotine dependence, cigarettes, uncomplicated: Secondary | ICD-10-CM | POA: Diagnosis present

## 2015-07-24 DIAGNOSIS — R062 Wheezing: Secondary | ICD-10-CM | POA: Diagnosis not present

## 2015-07-24 DIAGNOSIS — M549 Dorsalgia, unspecified: Secondary | ICD-10-CM | POA: Diagnosis present

## 2015-07-24 DIAGNOSIS — J690 Pneumonitis due to inhalation of food and vomit: Secondary | ICD-10-CM | POA: Diagnosis not present

## 2015-07-24 DIAGNOSIS — C3490 Malignant neoplasm of unspecified part of unspecified bronchus or lung: Secondary | ICD-10-CM

## 2015-07-24 DIAGNOSIS — I4891 Unspecified atrial fibrillation: Secondary | ICD-10-CM | POA: Diagnosis not present

## 2015-07-24 DIAGNOSIS — I2510 Atherosclerotic heart disease of native coronary artery without angina pectoris: Secondary | ICD-10-CM | POA: Diagnosis present

## 2015-07-24 DIAGNOSIS — R0789 Other chest pain: Secondary | ICD-10-CM | POA: Insufficient documentation

## 2015-07-24 DIAGNOSIS — N179 Acute kidney failure, unspecified: Secondary | ICD-10-CM | POA: Diagnosis not present

## 2015-07-24 DIAGNOSIS — F329 Major depressive disorder, single episode, unspecified: Secondary | ICD-10-CM | POA: Diagnosis present

## 2015-07-24 DIAGNOSIS — J440 Chronic obstructive pulmonary disease with acute lower respiratory infection: Secondary | ICD-10-CM | POA: Diagnosis present

## 2015-07-24 DIAGNOSIS — K922 Gastrointestinal hemorrhage, unspecified: Secondary | ICD-10-CM

## 2015-07-24 DIAGNOSIS — F17200 Nicotine dependence, unspecified, uncomplicated: Secondary | ICD-10-CM | POA: Diagnosis not present

## 2015-07-24 DIAGNOSIS — E876 Hypokalemia: Secondary | ICD-10-CM | POA: Diagnosis not present

## 2015-07-24 DIAGNOSIS — Z8711 Personal history of peptic ulcer disease: Secondary | ICD-10-CM | POA: Diagnosis not present

## 2015-07-24 DIAGNOSIS — Z79899 Other long term (current) drug therapy: Secondary | ICD-10-CM | POA: Diagnosis not present

## 2015-07-24 DIAGNOSIS — J189 Pneumonia, unspecified organism: Secondary | ICD-10-CM | POA: Diagnosis not present

## 2015-07-24 DIAGNOSIS — F419 Anxiety disorder, unspecified: Secondary | ICD-10-CM | POA: Diagnosis present

## 2015-07-24 DIAGNOSIS — Z85828 Personal history of other malignant neoplasm of skin: Secondary | ICD-10-CM | POA: Diagnosis not present

## 2015-07-24 DIAGNOSIS — E782 Mixed hyperlipidemia: Secondary | ICD-10-CM | POA: Diagnosis present

## 2015-07-24 DIAGNOSIS — K219 Gastro-esophageal reflux disease without esophagitis: Secondary | ICD-10-CM | POA: Diagnosis present

## 2015-07-24 DIAGNOSIS — K921 Melena: Secondary | ICD-10-CM | POA: Diagnosis present

## 2015-07-24 DIAGNOSIS — Z79891 Long term (current) use of opiate analgesic: Secondary | ICD-10-CM | POA: Diagnosis not present

## 2015-07-24 DIAGNOSIS — K439 Ventral hernia without obstruction or gangrene: Secondary | ICD-10-CM | POA: Diagnosis present

## 2015-07-24 DIAGNOSIS — E1151 Type 2 diabetes mellitus with diabetic peripheral angiopathy without gangrene: Secondary | ICD-10-CM | POA: Diagnosis present

## 2015-07-24 DIAGNOSIS — R0602 Shortness of breath: Secondary | ICD-10-CM | POA: Diagnosis not present

## 2015-07-24 DIAGNOSIS — G894 Chronic pain syndrome: Secondary | ICD-10-CM | POA: Diagnosis present

## 2015-07-24 DIAGNOSIS — Z794 Long term (current) use of insulin: Secondary | ICD-10-CM | POA: Diagnosis not present

## 2015-07-24 DIAGNOSIS — Z8614 Personal history of Methicillin resistant Staphylococcus aureus infection: Secondary | ICD-10-CM | POA: Diagnosis not present

## 2015-07-24 HISTORY — DX: Malignant neoplasm of unspecified part of unspecified bronchus or lung: C34.90

## 2015-07-24 LAB — TROPONIN I: Troponin I: <0.03 ng/mL (ref <0.031)

## 2015-07-24 LAB — GLUCOSE, CAPILLARY
GLUCOSE-CAPILLARY: 209 mg/dL — AB (ref 65–99)
GLUCOSE-CAPILLARY: 295 mg/dL — AB (ref 65–99)
GLUCOSE-CAPILLARY: 264 mg/dL — AB (ref 65–99)
Glucose-Capillary: 143 mg/dL — ABNORMAL HIGH (ref 65–99)
Glucose-Capillary: 175 mg/dL — ABNORMAL HIGH (ref 65–99)

## 2015-07-24 LAB — CBC
HCT: 38 % — ABNORMAL LOW (ref 39.0–52.0)
Hemoglobin: 12.6 g/dL — ABNORMAL LOW (ref 13.0–17.0)
MCH: 28.8 pg (ref 26.0–34.0)
MCHC: 33.2 g/dL (ref 30.0–36.0)
MCV: 86.8 fL (ref 78.0–100.0)
PLATELETS: 208 10*3/uL (ref 150–400)
RBC: 4.38 MIL/uL (ref 4.22–5.81)
RDW: 14.5 % (ref 11.5–15.5)
WBC: 14.6 10*3/uL — ABNORMAL HIGH (ref 4.0–10.5)

## 2015-07-24 LAB — BASIC METABOLIC PANEL
Anion gap: 13 (ref 5–15)
BUN: 27 mg/dL — ABNORMAL HIGH (ref 6–20)
CALCIUM: 8.7 mg/dL — AB (ref 8.9–10.3)
CO2: 25 mmol/L (ref 22–32)
CREATININE: 1.15 mg/dL (ref 0.61–1.24)
Chloride: 94 mmol/L — ABNORMAL LOW (ref 101–111)
GFR calc non Af Amer: >60 mL/min (ref >60)
Glucose, Bld: 153 mg/dL — ABNORMAL HIGH (ref 65–99)
Potassium: 3.8 mmol/L (ref 3.5–5.1)
Sodium: 132 mmol/L — ABNORMAL LOW (ref 135–145)

## 2015-07-24 LAB — RETICULOCYTES
RBC.: 4.38 MIL/uL (ref 4.22–5.81)
RETIC COUNT ABSOLUTE: 100.7 10*3/uL (ref 19.0–186.0)
Retic Ct Pct: 2.3 % (ref 0.4–3.1)

## 2015-07-24 LAB — MRSA PCR SCREENING: MRSA BY PCR: NEGATIVE

## 2015-07-24 MED ORDER — HYDROMORPHONE HCL 1 MG/ML IJ SOLN
1.0000 mg | INTRAMUSCULAR | Status: DC | PRN
  Administered 2015-07-24 (×2): 1 mg via INTRAVENOUS
  Filled 2015-07-24 (×2): qty 1

## 2015-07-24 MED ORDER — FUROSEMIDE 10 MG/ML IJ SOLN
40.0000 mg | Freq: Every day | INTRAMUSCULAR | Status: DC
  Administered 2015-07-24 – 2015-07-26 (×3): 40 mg via INTRAVENOUS
  Filled 2015-07-24 (×3): qty 4

## 2015-07-24 MED ORDER — UMECLIDINIUM BROMIDE 62.5 MCG/INH IN AEPB
1.0000 | INHALATION_SPRAY | Freq: Every day | RESPIRATORY_TRACT | Status: DC
  Administered 2015-07-24 – 2015-07-27 (×4): 1 via RESPIRATORY_TRACT
  Filled 2015-07-24 (×2): qty 7

## 2015-07-24 MED ORDER — LINAGLIPTIN 5 MG PO TABS
5.0000 mg | ORAL_TABLET | Freq: Every day | ORAL | Status: DC
  Administered 2015-07-24 – 2015-07-27 (×4): 5 mg via ORAL
  Filled 2015-07-24 (×4): qty 1

## 2015-07-24 MED ORDER — LEVOFLOXACIN IN D5W 750 MG/150ML IV SOLN
750.0000 mg | INTRAVENOUS | Status: DC
  Administered 2015-07-24 – 2015-07-25 (×2): 750 mg via INTRAVENOUS
  Filled 2015-07-24 (×3): qty 150

## 2015-07-24 MED ORDER — METHYLPREDNISOLONE SODIUM SUCC 40 MG IJ SOLR
40.0000 mg | Freq: Four times a day (QID) | INTRAMUSCULAR | Status: DC
  Administered 2015-07-24: 40 mg via INTRAVENOUS
  Filled 2015-07-24: qty 1

## 2015-07-24 MED ORDER — INSULIN ASPART 100 UNIT/ML ~~LOC~~ SOLN
4.0000 [IU] | Freq: Three times a day (TID) | SUBCUTANEOUS | Status: DC
  Administered 2015-07-24 – 2015-07-27 (×11): 4 [IU] via SUBCUTANEOUS

## 2015-07-24 MED ORDER — HYDROMORPHONE HCL 1 MG/ML IJ SOLN
1.0000 mg | INTRAMUSCULAR | Status: DC | PRN
  Administered 2015-07-24 – 2015-07-27 (×18): 1 mg via INTRAVENOUS
  Filled 2015-07-24 (×18): qty 1

## 2015-07-24 MED ORDER — FLUTICASONE PROPIONATE 50 MCG/ACT NA SUSP: 1.0000 | Freq: Two times a day (BID) | NASAL | Status: DC | PRN

## 2015-07-24 MED ORDER — LISINOPRIL 40 MG PO TABS
40.0000 mg | ORAL_TABLET | Freq: Every day | ORAL | Status: DC
  Administered 2015-07-24 – 2015-07-27 (×4): 40 mg via ORAL
  Filled 2015-07-24: qty 4
  Filled 2015-07-24 (×2): qty 1
  Filled 2015-07-24: qty 4

## 2015-07-24 MED ORDER — LISINOPRIL-HYDROCHLOROTHIAZIDE 20-12.5 MG PO TABS: 2.0000 | ORAL_TABLET | Freq: Every day | ORAL | Status: DC

## 2015-07-24 MED ORDER — INSULIN GLARGINE 100 UNIT/ML ~~LOC~~ SOLN
20.0000 [IU] | Freq: Every day | SUBCUTANEOUS | Status: DC
  Administered 2015-07-24 – 2015-07-26 (×3): 20 [IU] via SUBCUTANEOUS
  Filled 2015-07-24 (×4): qty 0.2

## 2015-07-24 MED ORDER — LORAZEPAM 2 MG/ML IJ SOLN
1.0000 mg | INTRAMUSCULAR | Status: DC | PRN
  Administered 2015-07-24 – 2015-07-26 (×6): 1 mg via INTRAVENOUS
  Filled 2015-07-24 (×7): qty 1

## 2015-07-24 MED ORDER — HEPARIN SODIUM (PORCINE) 5000 UNIT/ML IJ SOLN
5000.0000 [IU] | Freq: Three times a day (TID) | INTRAMUSCULAR | Status: DC
  Administered 2015-07-24 – 2015-07-25 (×4): 5000 [IU] via SUBCUTANEOUS
  Filled 2015-07-24 (×5): qty 1

## 2015-07-24 MED ORDER — BUDESONIDE 0.25 MG/2ML IN SUSP
0.2500 mg | Freq: Two times a day (BID) | RESPIRATORY_TRACT | Status: DC
  Administered 2015-07-24 – 2015-07-27 (×5): 0.25 mg via RESPIRATORY_TRACT
  Filled 2015-07-24 (×5): qty 2

## 2015-07-24 MED ORDER — ATENOLOL 50 MG PO TABS
100.0000 mg | ORAL_TABLET | Freq: Every day | ORAL | Status: DC
  Administered 2015-07-24 – 2015-07-27 (×4): 100 mg via ORAL
  Filled 2015-07-24: qty 4
  Filled 2015-07-24 (×2): qty 2
  Filled 2015-07-24: qty 4

## 2015-07-24 MED ORDER — SODIUM CHLORIDE 0.9% FLUSH
3.0000 mL | Freq: Two times a day (BID) | INTRAVENOUS | Status: DC
  Administered 2015-07-24 – 2015-07-27 (×6): 3 mL via INTRAVENOUS

## 2015-07-24 MED ORDER — GI COCKTAIL ~~LOC~~: 30.0000 mL | Freq: Three times a day (TID) | ORAL | Status: DC | PRN

## 2015-07-24 MED ORDER — INSULIN ASPART 100 UNIT/ML ~~LOC~~ SOLN
0.0000 [IU] | Freq: Three times a day (TID) | SUBCUTANEOUS | Status: DC
  Administered 2015-07-24: 7 [IU] via SUBCUTANEOUS
  Administered 2015-07-24: 11 [IU] via SUBCUTANEOUS
  Administered 2015-07-24: 7 [IU] via SUBCUTANEOUS
  Administered 2015-07-25 (×3): 4 [IU] via SUBCUTANEOUS
  Administered 2015-07-26 (×3): 7 [IU] via SUBCUTANEOUS
  Administered 2015-07-27 (×2): 3 [IU] via SUBCUTANEOUS

## 2015-07-24 MED ORDER — PANTOPRAZOLE SODIUM 40 MG PO TBEC
40.0000 mg | DELAYED_RELEASE_TABLET | Freq: Two times a day (BID) | ORAL | Status: DC
  Administered 2015-07-24 – 2015-07-27 (×7): 40 mg via ORAL
  Filled 2015-07-24 (×6): qty 1

## 2015-07-24 MED ORDER — METHYLPREDNISOLONE SODIUM SUCC 40 MG IJ SOLR
40.0000 mg | Freq: Two times a day (BID) | INTRAMUSCULAR | Status: DC
  Administered 2015-07-25 – 2015-07-26 (×2): 40 mg via INTRAVENOUS
  Filled 2015-07-24 (×4): qty 1

## 2015-07-24 MED ORDER — INSULIN ASPART 100 UNIT/ML ~~LOC~~ SOLN
0.0000 [IU] | Freq: Every day | SUBCUTANEOUS | Status: DC
  Administered 2015-07-26: 3 [IU] via SUBCUTANEOUS

## 2015-07-24 MED ORDER — GABAPENTIN 300 MG PO CAPS
600.0000 mg | ORAL_CAPSULE | Freq: Three times a day (TID) | ORAL | Status: DC
  Administered 2015-07-24 – 2015-07-27 (×10): 600 mg via ORAL
  Filled 2015-07-24 (×10): qty 2

## 2015-07-24 MED ORDER — IOPAMIDOL (ISOVUE-370) INJECTION 76%
100.0000 mL | Freq: Once | INTRAVENOUS | Status: AC | PRN
  Administered 2015-07-24: 100 mL via INTRAVENOUS

## 2015-07-24 MED ORDER — GEMFIBROZIL 600 MG PO TABS
600.0000 mg | ORAL_TABLET | Freq: Two times a day (BID) | ORAL | Status: DC
  Administered 2015-07-24 – 2015-07-27 (×7): 600 mg via ORAL
  Filled 2015-07-24 (×8): qty 1

## 2015-07-24 MED ORDER — MORPHINE SULFATE (PF) 2 MG/ML IV SOLN
2.0000 mg | INTRAVENOUS | Status: DC | PRN
  Administered 2015-07-26: 2 mg via INTRAVENOUS
  Filled 2015-07-24: qty 1

## 2015-07-24 MED ORDER — ATORVASTATIN CALCIUM 20 MG PO TABS
20.0000 mg | ORAL_TABLET | Freq: Every day | ORAL | Status: DC
Start: 2015-07-24 — End: 2015-07-27
  Administered 2015-07-24 – 2015-07-27 (×4): 20 mg via ORAL
  Filled 2015-07-24 (×4): qty 1

## 2015-07-24 MED ORDER — PANTOPRAZOLE SODIUM 40 MG PO TBEC
40.0000 mg | DELAYED_RELEASE_TABLET | Freq: Every day | ORAL | Status: DC
  Filled 2015-07-24: qty 1

## 2015-07-24 MED ORDER — FLUTICASONE FUROATE-VILANTEROL 100-25 MCG/INH IN AEPB
1.0000 | INHALATION_SPRAY | Freq: Every day | RESPIRATORY_TRACT | Status: DC
Start: 2015-07-24 — End: 2015-07-27
  Administered 2015-07-24 – 2015-07-27 (×4): 1 via RESPIRATORY_TRACT
  Filled 2015-07-24 (×2): qty 28

## 2015-07-24 MED ORDER — HYDROCHLOROTHIAZIDE 25 MG PO TABS: 25.0000 mg | ORAL_TABLET | Freq: Every day | ORAL | Status: DC

## 2015-07-24 MED ORDER — OXYCODONE HCL ER 15 MG PO T12A
30.0000 mg | EXTENDED_RELEASE_TABLET | Freq: Two times a day (BID) | ORAL | Status: DC
  Administered 2015-07-24 – 2015-07-27 (×7): 30 mg via ORAL
  Filled 2015-07-24 (×8): qty 2

## 2015-07-24 MED ORDER — BUPROPION HCL ER (XL) 150 MG PO TB24
150.0000 mg | ORAL_TABLET | Freq: Every day | ORAL | Status: DC
  Administered 2015-07-26 – 2015-07-27 (×2): 150 mg via ORAL
  Filled 2015-07-24 (×5): qty 1

## 2015-07-24 MED ORDER — AMLODIPINE BESYLATE 10 MG PO TABS
10.0000 mg | ORAL_TABLET | Freq: Every day | ORAL | Status: DC
Start: 2015-07-24 — End: 2015-07-27
  Administered 2015-07-24 – 2015-07-27 (×4): 10 mg via ORAL
  Filled 2015-07-24 (×2): qty 1
  Filled 2015-07-24 (×2): qty 2

## 2015-07-24 NOTE — Progress Notes (Signed)
Patient seen and examined. Admitted after midnight secondary to SOB, atypical chest discomfort and hemoptysis. 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 There is also findings suggesting post-obstructive PNA and vascular congestion. Patient is currently afebrile, AAOX3 and cooperative with examination. Please refer to H&P written by Dr. Marin Comment for further info/details on admission.  Plan: -continue antibiotics -will start pulmicort and flutter valve -pulmonology to assist with bronchoscopy and biopsy  -will need oncology consult once tissue diagnosis available -continue supportive care   Barton Dubois [phone]

## 2015-07-24 NOTE — H&P (Signed)
[facility] History and Physical  KEMARION ABBEY ZCH:[id number] DOB: Dec 31, 1950    PCP:   Fransisca Kaufmann Dettinger, MD   Chief Complaint: Chest pain, shortness of breath, hemoptysis  HPI: ALEJANDRA BARNA is an 65 y.o. male with a hx of tobacco use, HLD, essential HTN, DM type 2, OSA, and collarbone Fx 5 weeks ago who presents to the ED with complaints of sudden onset substernal CP radiating to back that onset yesterday, a cough and SOB for several weeks. Reports a "hacking" cough with production of sputum and hemoptysis that appears like 'tomatoe juice'. He reports that exercise exacerbates his CP. He also reports that he has had intermittent CP for years, but never as bad as current episode. Has had a stress test in the past.  He admits to using a CPAP that he has been suing since the 90's, hx of smoking, melena, taking a lot of ibuprofen, and a hx of ulcer. He denies a fever, is unable to sleep all night due to SOB or diaphoresis, nose bleed  While in the ED, workup showed BNP and troponin wnl. WBC 13.9, RBC 4.01, Hgb 11.5. Glucose was wnl. CXR showed worsened vascular and interstitial changes and worsened lingular alveolar opacity. Hospitalist was asked to admit for further management.  Rewiew of Systems:  Constitutional: Negative for malaise, fever and chills. No significant weight loss or weight gain Eyes: Negative for eye pain, redness and discharge, diplopia, visual changes, or flashes of light. ENMT: Negative for ear pain, hoarseness, nasal congestion, sinus pressure and sore throat. No headaches; tinnitus, drooling, or problem swallowing. Cardiovascular: Negative for chest pain, palpitations, diaphoresis, dyspnea and peripheral edema. ; No orthopnea, PND Respiratory: Negative for cough, hemoptysis, wheezing and stridor. No pleuritic chestpain. Gastrointestinal: Negative for nausea, vomiting, diarrhea, constipation, abdominal pain, melena, blood in stool, hematemesis, jaundice and  rectal bleeding.    Genitourinary: Negative for frequency, dysuria, incontinence,flank pain and hematuria; Musculoskeletal: Negative for back pain and neck pain. Negative for swelling and trauma.;  Skin: . Negative for pruritus, rash, abrasions, bruising and skin lesion.; ulcerations Neuro: Negative for headache, lightheadedness and neck stiffness. Negative for weakness, altered level of consciousness , altered mental status, extremity weakness, burning feet, involuntary movement, seizure and syncope.  Psych: negative for anxiety, depression, insomnia, tearfulness, panic attacks, hallucinations, paranoia, suicidal or homicidal ideation    Past Medical History  Diagnosis Date  . Essential hypertension, benign   . Type 2 diabetes mellitus ([place])   . Mixed hyperlipidemia   . OSA (obstructive sleep apnea)   . Partial small bowel obstruction ([place])   . 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 Coronary atherosclerosis of native coronary artery     Nonobstructive  . Erythrocytosis   . Peripheral arterial disease (HCC)     70% right iliac  . History of MRSA infection   . Cancer ([place]) 2015    skin  . Neuropathy ([place]) 2013    Past Surgical History  Procedure Laterality Date  . Appendectomy    . Neck surgery    . Back surgery    . Hemorrhoid surgery    . Vasectomy      Medications:  HOME MEDS: Prior to Admission medications   Medication Sig Start Date End Date Taking? Authorizing Provider  albuterol (PROVENTIL HFA;VENTOLIN HFA) 108 (90 Base) MCG/ACT inhaler Inhale 2 puffs into the lungs every 4 (four) hours as needed for wheezing or shortness of breath. 07/18/15   Fransisca Kaufmann Dettinger, MD  amLODipine (NORVASC) 10 MG tablet Take 10 mg by mouth daily.    Historical Provider, MD  atenolol (TENORMIN) 100 MG tablet Take 1 tablet (100 mg total) by mouth daily. 04/04/15   Fransisca Kaufmann Dettinger, MD  atorvastatin (LIPITOR) 20 MG tablet Take 1 tablet (20 mg total) by mouth daily. 04/04/15    Fransisca Kaufmann Dettinger, MD  buPROPion (WELLBUTRIN XL) 150 MG 24 hr tablet TAKE 1 TABLET BY MOUTH DAILY 07/23/15   Fransisca Kaufmann Dettinger, MD  doxycycline (VIBRA-TABS) 100 MG tablet Take 1 tablet (100 mg total) by mouth 2 (two) times daily. 1 po bid 07/18/15   Fransisca Kaufmann Dettinger, MD  fluticasone (FLONASE) 50 MCG/ACT nasal spray Place 1 spray into both nostrils 2 (two) times daily as needed for allergies or rhinitis. 05/15/15   Fransisca Kaufmann Dettinger, MD  fluticasone furoate-vilanterol (BREO ELLIPTA) 100-25 MCG/INH AEPB Inhale 1 puff into the lungs daily. 07/18/15   Fransisca Kaufmann Dettinger, MD  gabapentin (NEURONTIN) 600 MG tablet TAKE TWO (2) TABLETS THREE (3) TIMES DAILY 04/29/15   Fransisca Kaufmann Dettinger, MD  gemfibrozil (LOPID) 600 MG tablet Take 600 mg by mouth 2 (two) times daily before a meal.     Historical Provider, MD  Insulin Degludec (TRESIBA FLEXTOUCH) 200 UNIT/ML SOPN Inject 95 Units into the skin daily. 05/08/15   Fransisca Kaufmann Dettinger, MD  Insulin Pen Needle (PEN NEEDLES) 31G X 6 MM MISC 1 each by Does not apply route 4 (four) times daily. Use with insulin pens to inject insulin up to 5 times daily 03/20/15   Tammy Eckard, PHARMD  JANUVIA 100 MG tablet TAKE 1 TABLET BY MOUTH DAILY 07/23/15   Fransisca Kaufmann Dettinger, MD  lisinopril-hydrochlorothiazide (PRINZIDE,ZESTORETIC) 20-12.5 MG tablet TAKE 2 TABLETS BY MOUTH DAILY. 07/23/15   Fransisca Kaufmann Dettinger, MD  metFORMIN (GLUCOPHAGE) 1000 MG tablet Take 1,000 mg by mouth 2 (two) times daily with a meal.  11/14/13   Historical Provider, MD  NOVOLOG FLEXPEN 100 UNIT/ML FlexPen INJECT UP TO 40 UNITS UP TO FOUR TIMES A DAY 06/24/15   Fransisca Kaufmann Dettinger, MD  omeprazole (PRILOSEC) 40 MG capsule Take 40 mg by mouth daily.     Historical Provider, MD  oxycodone (OXY-IR) 5 MG capsule Use every four to six hours for breakthrough pain for new clavicle fracture. 06/18/15   Sanjuana Kava, MD  oxyCODONE (OXYCONTIN) 30 MG 12 hr tablet Take 30 mg by mouth 2 (two) times daily.  01/07/15   Historical  Provider, MD  oxyCODONE-acetaminophen (PERCOCET/ROXICET) 5-325 MG tablet Take 1 tablet by mouth every 6 (six) hours as needed. 06/16/15   Lily Kocher, PA-C  predniSONE (DELTASONE) 20 MG tablet 2 po at same time daily for 5 days 07/18/15   Fransisca Kaufmann Dettinger, MD  tiZANidine (ZANAFLEX) 4 MG tablet Take 4 mg by mouth 2 (two) times daily as needed for muscle spasms.    Historical Provider, MD  umeclidinium bromide (INCRUSE ELLIPTA) 62.5 MCG/INH AEPB Inhale 1 puff into the lungs daily. 07/18/15   Fransisca Kaufmann Dettinger, MD     Allergies:  Allergies  Allergen Reactions  . Nsaids Other (See Comments)    ulcers  . Prednisone Other (See Comments)    High sugar levels  . Vancomycin     Caused Stevens-Johnsons syndrome    Social History:   reports that he has been smoking Cigarettes.  He has a 40 pack-year smoking history. He has never used smokeless tobacco. He reports that he does not drink alcohol or use  illicit drugs.  Family History: Family History  Problem Relation Age of Onset  . Coronary artery disease    . Hypertension    . Cancer Mother   . Depression Mother   . Diabetes Mother   . Heart disease Maternal Grandfather   . Hyperlipidemia Maternal Grandfather   . Hypertension Maternal Grandfather      Physical Exam: Filed Vitals:   07/23/15 2330 07/23/15 2333 07/23/15 2339 07/24/15 0018  BP: 140/74   122/70  Pulse: 79   78  Temp:      TempSrc:      Resp: 13   14  Height:      Weight:      SpO2: 92% 93% 93% 96%   Blood pressure 122/70, pulse 78, temperature 98 F (36.7 C), temperature source Oral, resp. rate 14, height '5\' 11"'$  (1.803 m), weight 90.719 kg (200 lb), SpO2 96 %.  GEN:  Pleasant,  patient lying in the stretcher in no acute distress; cooperative with exam. PSYCH:  alert and oriented x4; does not appear anxious or depressed; affect is appropriate. HEENT: Mucous membranes pink and anicteric; PERRLA; EOM intact; no cervical lymphadenopathy nor thyromegaly or carotid  bruit; no JVD; There were no stridor. Neck is very supple. Breasts:: Not examined CHEST WALL: No tenderness CHEST: Normal respiration, mild wheezing, scattered rhonchi.  HEART: Regular rate and rhythm.  There are no murmur, rub, or gallops. BACK: No kyphosis or scoliosis; no CVA tenderness ABDOMEN: soft and non-tender; no masses, no organomegaly, normal abdominal bowel sounds; no pannus; no intertriginous candida. There is no rebound and no distention. Rectal Exam: Not done EXTREMITIES: No bone or joint deformity; age-appropriate arthropathy of the hands and knees; no edema; no ulcerations.  There is no calf tenderness. Genitalia: not examined PULSES: 2+ and symmetric SKIN:  chronic ichthyosis BLE CNS: Cranial nerves 2-12 grossly intact no focal lateralizing neurologic deficit.  Speech is fluent; uvula elevated with phonation, facial symmetry and tongue midline. DTR are normal bilaterally, cerebella exam is intact, barbinski is negative and strengths are equaled bilaterally.  No sensory loss.   Labs on Admission:  Basic Metabolic Panel:  Recent Labs Lab 07/23/15 2241  NA 132*  K 3.8  CL 95*  CO2 26  GLUCOSE 105*  BUN 26*  CREATININE 1.17  CALCIUM 8.6*   CBC:  Recent Labs Lab 07/23/15 2241  WBC 13.9*  NEUTROABS 10.0*  HGB 11.5*  HCT 34.8*  MCV 86.8  PLT 197   Cardiac Enzymes:  Recent Labs Lab 07/23/15 2240  TROPONINI <0.03    Radiological Exams on Admission: Dg Chest 2 View  07/23/2015  CLINICAL DATA:  Cough and dyspnea for several weeks. Hemoptysis tonight. EXAM: CHEST  2 VIEW COMPARISON:  06/16/2015 FINDINGS: There is mild hyperinflation. There is moderate vascular and interstitial prominence which has worsened. There is increased interstitial fluid or thickening in the basilar periphery. There is worsened alveolar opacity in the lingula. These findings may represent congestive heart failure with asymmetric alveolar edema. There is no pleural effusion. IMPRESSION:  Worsened vascular and interstitial changes. Worsened lingular alveolar opacity. This may represent congestive failure but an infectious infiltrate is not entirely excluded. Electronically Signed   By: Andreas Newport M.D.   On: 07/23/2015 21:21    EKG: Independently reviewed.    Assessment/Plan Present on Admission:  . Chest pain . Essential hypertension, benign . Obstructive sleep apnea . Peripheral arterial disease ([place]) . Chest pain at rest  1. Chest pain, SOB, hemoptysis:  Must r/out Pulmonary embolism. Pt has a hx of OSA, anemia, and has been experiencing hemoptysis. Will order CT angiogram. Will hold off on heparin for now. Will start on ASA. Can't exclude malignancy though CXR was negative.  2. Possible CAP. Will start on abx and steroids 3. Possible GI bleed.  It would be slow bleed.  Give PPI,  Pt is anemic. Complains of melena.  Check anemia panel. 4. DM type 2. Glucose wnl. Will start on SSI. 5. Essential HTN, benign.  Other plans as per orders. Code Status: Full   Orvan Falconer, MD. Rosalita Chessman. [facility] Pager [phone] 7pm to [place].  07/24/2015, 12:50 AM   By signing my name below, I, Delene Ruffini, attest that this documentation has been prepared under the direction and in the presence of Orvan Falconer, MD. Electronically Signed: Delene Ruffini, Scribe 07/24/2015 12:50am

## 2015-07-24 NOTE — Care Management Note (Signed)
Case Management Note  Patient Details  Name: [name] MRN: [id number] Date of Birth: Jan 04, 1951  Subjective/Objective:        Spoke with patient who is alert and oriented from home. Stated that they can afford medications and are able to make thier medical appointments without difficulty. No DME or O2 at home. No CM needs identified.            Action/Plan:Home with self care.   Expected Discharge Date:                  Expected Discharge Plan:  Home/Self Care  In-House Referral:     Discharge planning Services  CM Consult  Post Acute Care Choice:    Choice offered to:     DME Arranged:    DME Agency:     HH Arranged:    [place] Agency:     Status of Service:  Completed, signed off  Medicare Important Message Given:    Date Medicare IM Given:    Medicare IM give by:    Date Additional Medicare IM Given:    Additional Medicare Important Message give by:     If discussed at National of Stay Meetings, dates discussed:    Additional Comments:  Alvie Heidelberg, RN 07/24/2015, 10:57 AM

## 2015-07-24 NOTE — ED Notes (Signed)
Pt maintained O2 at 90 while ambulating

## 2015-07-24 NOTE — Progress Notes (Signed)
CTA of the chest did not show any PE.  Unfortunately, it did reveal a large mass with extensive multifocal mediastinal adenopathy suspicious for bronchogenic pulmonary malignancy.  He is told of this finding, and I plan to consult pulmonary for consideration of inpatient bronchoscopy with biopsy.  He is requesting pain meds, and I will give him IV Dilaudid along with IV Ativan.    Orvan Falconer, MD FACP.  Hospital Medicine.

## 2015-07-24 NOTE — Progress Notes (Signed)
Pt became very confused the latter part of the shift.  He was not combattive but he would not comply to request nor cooperate with me.  He took all of his prn meds but was still restless and wanted to move around the room although he was a high fall risk.  (He had fallen at home recently).  Info was relayed  to AM nurse.

## 2015-07-25 DIAGNOSIS — E119 Type 2 diabetes mellitus without complications: Secondary | ICD-10-CM

## 2015-07-25 DIAGNOSIS — Z794 Long term (current) use of insulin: Secondary | ICD-10-CM

## 2015-07-25 DIAGNOSIS — J189 Pneumonia, unspecified organism: Secondary | ICD-10-CM

## 2015-07-25 LAB — IRON AND TIBC
Iron: 38 ug/dL — ABNORMAL LOW (ref 45–182)
Saturation Ratios: 10 % — ABNORMAL LOW (ref 17.9–39.5)
TIBC: 372 ug/dL (ref 250–450)
UIBC: 334 ug/dL

## 2015-07-25 LAB — CBC
HEMATOCRIT: 35.1 % — AB (ref 39.0–52.0)
HEMOGLOBIN: 11.5 g/dL — AB (ref 13.0–17.0)
MCH: 28.2 pg (ref 26.0–34.0)
MCHC: 32.8 g/dL (ref 30.0–36.0)
MCV: 86 fL (ref 78.0–100.0)
Platelets: 238 10*3/uL (ref 150–400)
RBC: 4.08 MIL/uL — ABNORMAL LOW (ref 4.22–5.81)
RDW: 14.5 % (ref 11.5–15.5)
WBC: 13.2 10*3/uL — AB (ref 4.0–10.5)

## 2015-07-25 LAB — GLUCOSE, CAPILLARY
Glucose-Capillary: 163 mg/dL — ABNORMAL HIGH (ref 65–99)
Glucose-Capillary: 194 mg/dL — ABNORMAL HIGH (ref 65–99)
Glucose-Capillary: 196 mg/dL — ABNORMAL HIGH (ref 65–99)
Glucose-Capillary: 161 mg/dL — ABNORMAL HIGH (ref 65–99)

## 2015-07-25 LAB — FOLATE: FOLATE: 37.7 ng/mL (ref >5.9)

## 2015-07-25 LAB — BASIC METABOLIC PANEL
ANION GAP: 14 (ref 5–15)
BUN: 44 mg/dL — ABNORMAL HIGH (ref 6–20)
CHLORIDE: 97 mmol/L — AB (ref 101–111)
CO2: 24 mmol/L (ref 22–32)
Calcium: 9.3 mg/dL (ref 8.9–10.3)
Creatinine, Ser: 1.08 mg/dL (ref 0.61–1.24)
GFR calc non Af Amer: >60 mL/min (ref >60)
Glucose, Bld: 175 mg/dL — ABNORMAL HIGH (ref 65–99)
Potassium: 4 mmol/L (ref 3.5–5.1)
Sodium: 135 mmol/L (ref 135–145)

## 2015-07-25 LAB — VITAMIN B12: VITAMIN B 12: 604 pg/mL (ref 180–914)

## 2015-07-25 LAB — FERRITIN: Ferritin: 81 ng/mL (ref 24–336)

## 2015-07-25 NOTE — Progress Notes (Signed)
[facility] PROGRESS NOTE  [name] IRJ:[id number] DOB: June 09, 1950 DOA: 07/23/2015 PCP: Fransisca Kaufmann Dettinger, MD  Brief summary and H&P 65 y.o. male with a hx of tobacco use, HLD, essential HTN, DM type 2, OSA, and collarbone Fx 5 weeks ago who presents to the ED with complaints of sudden onset substernal CP radiating to back that onset yesterday, a cough and SOB for several weeks. Reports a "hacking" cough with production of sputum and hemoptysis that appears like 'tomatoe juice'. He reports that exercise exacerbates his CP. He also reports that he has had intermittent CP for years, but never as bad as current episode. He admits to be compliant with a CPAP that he has been using since the 90's, hx of smoking, melena, taking a lot of ibuprofen, and a hx of ulcer. He denies a fever, is unable to sleep all night due to SOB, diaphoresis and nose bleed.  While in the ED, workup showed BNP and troponin wnl. WBC 13.9, Hgb 11.5 and was found to be mildly hypoxic and slightly tachycardic. A CT angio done to r/o PE and demonstrated no pulmonary embolism, but positive findings for left mass with mediastinal adenopathy and post obstructive PNA.  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) -also with findings suggesting obstructive PNA -will continue abx's -will need to be transfer to [facility] for further evaluation and potential bronchoscopy with transbronchial biopsy -further decision base on clinical course and results -no PE visualized on CT angio -will continue pulmicort, PRN nebulizer and flutter valve  2-essential HTN: stable -continue current antihypertensive regimen   3-mixed HLD: -continue statins  4-depression/anxiety: -continue Wellbutrin  5-GERD:  -continue PPI  6-chronic pain syndrome:  -continue current pain regimen   7-Diabetes type 2: -continue  SSI  8-OSA: -continue QHS CPAP  Code Status: Full Family Communication: wife at bedside  Disposition Plan: will transfer to [facility] for bronchoscopy with transbronchial biopsy vs IR nodule biopsy and further treatment. Continue current antibiotics and supportive care.   Consultants:  Pulmonary service   Procedures:  See below for x-ray reports   Antibiotics:  Levaquin 07/24/15  HPI/Subjective: Afebrile, no CP; breathing overall stable. Complaining of intermittent episodes of CP, SOB and hemoptysis   Objective: Filed Vitals:   07/24/15 2151 07/25/15 0631  BP: 136/70 157/83  Pulse: 82 87  Temp: 97.6 F (36.4 C) 97.5 F (36.4 C)  Resp: 20 20    Intake/Output Summary (Last 24 hours) at 07/25/15 1316 Last data filed at 07/25/15 0700  Gross per 24 hour  Intake    840 ml  Output      0 ml  Net    840 ml   Filed Weights   07/23/15 2058 07/24/15 0238  Weight: 90.719 kg (200 lb) 95.618 kg (210 lb 12.8 oz)    Exam:   General:  Afebrile, still with intermittent episodes of chest discomfort, very little hemoptysis and SOB. Good O2 sat on RA  Cardiovascular: S1 and s2, no rubs or gallops  Respiratory: fair air movement, no use of accessory muscles; positive end exp wheezing and rhonchi  Abdomen: soft, NT, ND, positive BS  Musculoskeletal: trace edema bilaterally, no cyanosis   Data Reviewed: Basic Metabolic Panel:  Recent Labs Lab 07/23/15 2241 07/24/15 0334 07/25/15 0504  NA 132* 132* 135  K 3.8 3.8 4.0  CL 95* 94* 97*  CO2 '26 25 24  '$ GLUCOSE 105* 153* 175*  BUN 26*  27* 44*  CREATININE 1.17 1.15 1.08  CALCIUM 8.6* 8.7* 9.3   CBC:  Recent Labs Lab 07/23/15 2241 07/24/15 0334 07/25/15 0504  WBC 13.9* 14.6* 13.2*  NEUTROABS 10.0*  --   --   HGB 11.5* 12.6* 11.5*  HCT 34.8* 38.0* 35.1*  MCV 86.8 86.8 86.0  PLT 197 208 238   Cardiac Enzymes:  Recent Labs Lab 07/23/15 2240 07/24/15 0334 07/24/15 0921 07/24/15 1422  TROPONINI <0.03 <0.03 <0.03  <0.03   BNP (last 3 results)  Recent Labs  07/23/15 2241  BNP 12.0   CBG:  Recent Labs Lab 07/24/15 1122 07/24/15 1630 07/24/15 2147 07/25/15 0723 07/25/15 1203  GLUCAP 295* 264* 175* 163* 194*    Recent Results (from the past 240 hour(s))  MRSA PCR Screening     Status: None   Collection Time: 07/24/15  2:55 AM  Result Value Ref Range Status   MRSA by PCR NEGATIVE NEGATIVE Final    Comment:        The GeneXpert MRSA Assay (FDA approved for NASAL specimens only), is one component of a comprehensive MRSA colonization surveillance program. It is not intended to diagnose MRSA infection nor to guide or monitor treatment for MRSA infections.      Studies: Dg Chest 2 View  07/23/2015  CLINICAL DATA:  Cough and dyspnea for several weeks. Hemoptysis tonight. EXAM: CHEST  2 VIEW COMPARISON:  06/16/2015 FINDINGS: There is mild hyperinflation. There is moderate vascular and interstitial prominence which has worsened. There is increased interstitial fluid or thickening in the basilar periphery. There is worsened alveolar opacity in the lingula. These findings may represent congestive heart failure with asymmetric alveolar edema. There is no pleural effusion. IMPRESSION: Worsened vascular and interstitial changes. Worsened lingular alveolar opacity. This may represent congestive failure but an infectious infiltrate is not entirely excluded. Electronically Signed   By: Andreas Newport M.D.   On: 07/23/2015 21:21   Ct Angio Chest Pe W/cm &/or Wo Cm  07/24/2015  CLINICAL DATA:  Hemoptysis. Sudden onset of chest pain yesterday afternoon. Pain radiates to the back. EXAM: CT ANGIOGRAPHY CHEST WITH CONTRAST TECHNIQUE: Multidetector CT imaging of the chest was performed using the standard protocol during bolus administration of intravenous contrast. Multiplanar CT image reconstructions and MIPs were obtained to evaluate the vascular anatomy. CONTRAST:  100 mL Isovue 370 IV COMPARISON:  Chest  radiographs 5 hours prior.  Chest CT 12/20/2010 FINDINGS: Mediastinum/Lymph Nodes: Extensive multifocal mediastinal adenopathy. Enlarged lymph nodes in the right upper paratracheal, highest mediastinum, right lower paratracheal, AP window, and subcarinal stations. Subcarinal soft tissue density is unable to be separated from the esophagus, however measures at least 5.6 cm transverse dimension. Extensive left hilar soft tissue density, contiguous with multi focal nodular soft tissue density in the left lower lobe. This soft tissue density leads to narrowing of the left lower lobe pulmonary arteries without definite invasion. No discrete filling defects within the pulmonary arteries. Some degree of occlusion of the left lower lobe pulmonary vein. Thoracic aorta is normal in caliber. No pericardial effusion. There is a prominent right hilar lymph node. Heart is upper limits of normal in size, there are coronary artery calcifications. Lungs/Pleura: Multifocal innumerable left lower lobe nodules, confluent in the infrahilar region, with a masslike confluence measuring 3.2 x 3.2 cm. More superiorly this confluence measures 4.1 cm 4.1 x 3.5 cm and includes a segmental branch of the left lower lobe pulmonary artery. Associated diffuse ground-glass and ill-defined opacity in the left lower  lobe. Paramediastinal nodule in the lingula measures 1.5 cm. There is fissural thickening of the interlobar fissure. Diffuse septal thickening in the left lower lobe. Bubbly dependent debris in the left mainstem bronchus extending into the lower lobe with bronchial thickening and luminal narrowing. No definite nodule in the right lung. Moderate emphysema. Trace left pleural effusion. No right pleural effusion. Upper abdomen: Portions of both adrenal glands are normal, however not entirely excluded. No evidence of focal lesion in the liver allowing for phase of contrast. No acute abnormality in the included upper abdomen. Musculoskeletal:  Right mid clavicle fracture with minimal surrounding callus formation. No blastic or destructive lytic lesions. Review of the MIP images confirms the above findings. IMPRESSION: 1. Extensive masslike nodular opacities in the left lower lobe, with a confluent infrahilar mass measuring 3.2 x 3.2 cm, many of which are contiguous with left hilar soft tissue density. There is extensive multifocal mediastinal adenopathy. 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 These masslike opacities encase the left lower lobe pulmonary artery leads luminal narrowing but no discrete invasion or frank pulmonary embolus. Multiple ground-glass opacities in the left lower lobe, may be pneumonitis, postobstructive atelectasis or spread of malignancy. 2. Additional lingular nodule measures 1.5 cm. 3. Bubbly density in the left mainstem bronchus and left lower lobe, with left lung bronchial thickening. This is likely secretions or aspiration. 4. Moderate emphysema. Electronically Signed   By: Jeb Levering M.D.   On: 07/24/2015 02:17    Scheduled Meds: . amLODipine  10 mg Oral Daily  . atenolol  100 mg Oral Daily  . atorvastatin  20 mg Oral Daily  . budesonide (PULMICORT) nebulizer solution  0.25 mg Nebulization BID  . buPROPion  150 mg Oral Daily  . fluticasone furoate-vilanterol  1 puff Inhalation Daily  . furosemide  40 mg Intravenous Daily  . gabapentin  600 mg Oral TID  . gemfibrozil  600 mg Oral BID AC  . heparin  5,000 Units Subcutaneous 3 times per day  . insulin aspart  0-20 Units Subcutaneous TID WC  . insulin aspart  0-5 Units Subcutaneous QHS  . insulin aspart  4 Units Subcutaneous TID WC  . insulin glargine  20 Units Subcutaneous QHS  . levofloxacin (LEVAQUIN) IV  750 mg Intravenous Q24H  . linagliptin  5 mg Oral Daily  . lisinopril  40 mg Oral Daily  . methylPREDNISolone (SOLU-MEDROL) injection  40 mg Intravenous Q12H  . oxyCODONE  30 mg Oral BID  .  pantoprazole  40 mg Oral BID  . sodium chloride flush  3 mL Intravenous Q12H  . umeclidinium bromide  1 puff Inhalation Daily   Continuous Infusions:   Principal Problem:   Chest pain Active Problems:   Essential hypertension, benign   Peripheral arterial disease ([place])   Type 2 diabetes mellitus without complication, with long-term current use of insulin (HCC)   Obstructive sleep apnea   GI bleeding   Chest pain at rest   Atypical chest pain   Lung mass   Hemoptysis    Time spent: 35 minutes    Barton Dubois  [facility] Pager [phone]. If 7PM-7AM, please contact night-coverage at [email or web address], password [facility] 07/25/2015, 1:16 PM  LOS: 1 day

## 2015-07-25 NOTE — Consult Note (Signed)
Full note to follow. This is a 65 year old who came to the emergency department with chest pain. Workup included CT chest looking for pulmonary emboli and this shows extensive abnormalities in the left lung and mediastinum. It does not appear that any of this is going to be amenable to bronchoscopy without transbronchial biopsy and/or needle biopsy of the mediastinal nodes. I am not able to do transbronchial biopsy or needle biopsy here. I think he will need to be transferred to [facility] and have consult with pulmonary/thoracic surgery to make a definite diagnosis.

## 2015-07-26 ENCOUNTER — Institutional Professional Consult (permissible substitution): Payer: Medicare Other | Admitting: Pulmonary Disease

## 2015-07-26 DIAGNOSIS — F1190 Opioid use, unspecified, uncomplicated: Secondary | ICD-10-CM

## 2015-07-26 DIAGNOSIS — K222 Esophageal obstruction: Secondary | ICD-10-CM

## 2015-07-26 DIAGNOSIS — R918 Other nonspecific abnormal finding of lung field: Secondary | ICD-10-CM

## 2015-07-26 DIAGNOSIS — R0789 Other chest pain: Secondary | ICD-10-CM

## 2015-07-26 DIAGNOSIS — G8929 Other chronic pain: Secondary | ICD-10-CM

## 2015-07-26 DIAGNOSIS — R634 Abnormal weight loss: Secondary | ICD-10-CM

## 2015-07-26 DIAGNOSIS — R1314 Dysphagia, pharyngoesophageal phase: Secondary | ICD-10-CM | POA: Insufficient documentation

## 2015-07-26 DIAGNOSIS — M549 Dorsalgia, unspecified: Secondary | ICD-10-CM

## 2015-07-26 DIAGNOSIS — C4491 Basal cell carcinoma of skin, unspecified: Secondary | ICD-10-CM

## 2015-07-26 LAB — GLUCOSE, CAPILLARY
GLUCOSE-CAPILLARY: 213 mg/dL — AB (ref 65–99)
GLUCOSE-CAPILLARY: 207 mg/dL — AB (ref 65–99)
GLUCOSE-CAPILLARY: 213 mg/dL — AB (ref 65–99)
GLUCOSE-CAPILLARY: 235 mg/dL — AB (ref 65–99)
GLUCOSE-CAPILLARY: 270 mg/dL — AB (ref 65–99)

## 2015-07-26 MED ORDER — LEVOFLOXACIN 750 MG PO TABS
750.0000 mg | ORAL_TABLET | Freq: Every day | ORAL | Status: DC
  Administered 2015-07-26: 750 mg via ORAL
  Filled 2015-07-26: qty 1

## 2015-07-26 MED ORDER — METRONIDAZOLE IN NACL 5-0.79 MG/ML-% IV SOLN
500.0000 mg | Freq: Three times a day (TID) | INTRAVENOUS | Status: DC
  Administered 2015-07-26 – 2015-07-27 (×3): 500 mg via INTRAVENOUS
  Filled 2015-07-26 (×3): qty 100

## 2015-07-26 NOTE — Progress Notes (Addendum)
[facility] Progress Note                                                                                                                                                                                                                      Patient Demographics:    [name], is a 65 y.o. male, DOB - 1950-10-30, VOZ:[id number]  Admit date - 07/23/2015   Admitting Physician Orvan Falconer, MD  Outpatient Primary MD for the patient is Worthy Rancher, MD  LOS - 2  Outpatient Specialists:   Chief Complaint  Patient presents with  . Shortness of Breath  . Cough      Brief summary  66 y.o. male with a hx of tobacco use, HLD, essential HTN, DM type 2, OSA, and collarbone Fx 5 weeks ago who presents to the ED with complaints of sudden onset substernal CP radiating to back that onset yesterday, a cough and SOB for several weeks. Reports a "hacking" cough with production of sputum and hemoptysis that appears like 'tomatoe juice'. He reports that exercise exacerbates his CP. He also reports that he has had intermittent CP for years, but never as bad as current episode. He admits to be compliant with a CPAP that he has been using since the 90's, hx of smoking, melena, taking a lot of ibuprofen, and a hx of ulcer. He denies a fever, is unable to sleep all night due to SOB, diaphoresis and nose bleed.  While in the ED, workup showed BNP and troponin wnl. WBC 13.9, Hgb 11.5 and was found to be mildly hypoxic and slightly tachycardic. A CT angio done to r/o PE and demonstrated no pulmonary embolism, but positive findings for left mass with mediastinal adenopathy and post obstructive PNA.    Subjective:    [name] today has, No headache, No chest pain, No abdominal pain - No Nausea, No new weakness tingling or numbness, No shortness of breath but mild productive cough with streaks of blood and  sometimes mild hemoptysis   Assessment  & Plan :     1.Hemoptysis with suspicious for left-sided lung mass on CT chest with atypical chest pain. Seen by pulmonary, patient has history of smoking and COPD, counseled to quit, patient due for fiberoptic bronchoscopy which will be scheduled by pulmonary. Continue supportive care.  2. Possible postobstructive pneumonia. On Levaquin and will add Flagyl. Stable. Continue supportive care.  3. History of NSAID use, history of dark stools. Could be aspirated blood from hemoptysis however  upper GI bleed cannot be ruled out, PPI, avoid NSAIDs, GI consulted as requested by pulmonary.  4. Essential hypertension. On combination of Norvasc, ACE, atenolol and low-dose diuretic PRN continue.  5. Hx of smoking. Counseled to quit.  6. GERD & Dysphagia. On PPI. GI - Speech eval, Barium swallow per GI.  7. Dyslipidemia. Continue statin and Lopid.  8. COPD. Minimal to no wheezing. Continue supportive care, taper of steroids rapidly.   9. Recent clavicle injury along with chronic pain syndrome. Supportive care. Minimize narcotics.   10. Obstructive sleep apnea and obesity. Outpatient PCP follow-up, CPAP at night.   11. DM type II. On Trajenta along with sliding scale  Lab Results  Component Value Date   HGBA1C 7.4 02/27/2015    CBG (last 3)   Recent Labs  07/25/15 2137 07/26/15 0526 07/26/15 0709  GLUCAP 161* 213* 207*      Code Status : Full  Family Communication  : Wife  Disposition Plan  : Stay inpatient  Barriers For Discharge : Evaluation for hemoptysis and possible upper GI bleed  Consults  :  Pulmonary, GI  Procedures  :   CT chest left-sided lung mass.  DVT Prophylaxis  :    SCDs    Lab Results  Component Value Date   PLT 238 07/25/2015    Antibiotics  :     Anti-infectives    Start     Dose/Rate Route Frequency Ordered Stop   07/26/15 2200  levofloxacin (LEVAQUIN) tablet 750 mg     750 mg Oral Daily at bedtime  07/26/15 0913     07/24/15 2200  levofloxacin (LEVAQUIN) IVPB 750 mg  Status:  Discontinued     750 mg 100 mL/hr over 90 Minutes Intravenous Every 24 hours 07/24/15 0238 07/26/15 0913   07/23/15 2330  levofloxacin (LEVAQUIN) IVPB 750 mg     750 mg 100 mL/hr over 90 Minutes Intravenous  Once 07/23/15 2323 07/24/15 0216        Objective:   Filed Vitals:   07/25/15 2226 07/26/15 0500 07/26/15 0703 07/26/15 0931  BP: 146/76 145/57 155/85   Pulse: 80 75 82   Temp: 97.6 F (36.4 C) 98 F (36.7 C) 97.8 F (36.6 C)   TempSrc: Oral Oral Oral   Resp: '20 20 18   '$ Height:      Weight:      SpO2: 95% 96% 99% 94%    Wt Readings from Last 3 Encounters:  07/24/15 95.618 kg (210 lb 12.8 oz)  07/18/15 93.169 kg (205 lb 6.4 oz)  07/09/15 93.895 kg (207 lb)     Intake/Output Summary (Last 24 hours) at 07/26/15 1110 Last data filed at 07/25/15 1820  Gross per 24 hour  Intake    480 ml  Output      0 ml  Net    480 ml     Physical Exam  Awake Alert, Oriented X 3, No new F.N deficits, Normal affect [place].AT,PERRAL Supple Neck,No JVD, No cervical lymphadenopathy appriciated.  Symmetrical Chest wall movement, Good air movement bilaterally, CTAB RRR,No Gallops,Rubs or new Murmurs, No Parasternal Heave +ve B.Sounds, Abd Soft, No tenderness, No organomegaly appriciated, No rebound - guarding or rigidity. No Cyanosis, Clubbing or edema, No new Rash or bruise     Data Review:    CBC  Recent Labs Lab 07/23/15 2241 07/24/15 0334 07/25/15 0504  WBC 13.9* 14.6* 13.2*  HGB 11.5* 12.6* 11.5*  HCT 34.8* 38.0* 35.1*  PLT 197 208  238  MCV 86.8 86.8 86.0  MCH 28.7 28.8 28.2  MCHC 33.0 33.2 32.8  RDW 14.6 14.5 14.5  LYMPHSABS 2.1  --   --   MONOABS 1.7*  --   --   EOSABS 0.1  --   --   BASOSABS 0.0  --   --     Chemistries   Recent Labs Lab 07/23/15 2241 07/24/15 0334 07/25/15 0504  NA 132* 132* 135  K 3.8 3.8 4.0  CL 95* 94* 97*  CO2 '26 25 24  '$ GLUCOSE 105* 153* 175*  BUN  26* 27* 44*  CREATININE 1.17 1.15 1.08  CALCIUM 8.6* 8.7* 9.3   ------------------------------------------------------------------------------------------------------------------ No results for input(s): CHOL, HDL, LDLCALC, TRIG, CHOLHDL, LDLDIRECT in the last 72 hours.  Lab Results  Component Value Date   HGBA1C 7.4 02/27/2015   ------------------------------------------------------------------------------------------------------------------ No results for input(s): TSH, T4TOTAL, T3FREE, THYROIDAB in the last 72 hours.  Invalid input(s): FREET3 ------------------------------------------------------------------------------------------------------------------  Recent Labs  07/23/15 2241 07/24/15 0334  VITAMINB12 604  --   FOLATE  --  37.7  FERRITIN 81  --   TIBC 372  --   IRON 38*  --   RETICCTPCT  --  2.3    Coagulation profile No results for input(s): INR, PROTIME in the last 168 hours.  No results for input(s): DDIMER in the last 72 hours.  Cardiac Enzymes  Recent Labs Lab 07/24/15 0334 07/24/15 0921 07/24/15 1422  TROPONINI <0.03 <0.03 <0.03   ------------------------------------------------------------------------------------------------------------------    Component Value Date/Time   BNP 12.0 07/23/2015 2241    Inpatient Medications  Scheduled Meds: . amLODipine  10 mg Oral Daily  . atenolol  100 mg Oral Daily  . atorvastatin  20 mg Oral Daily  . budesonide (PULMICORT) nebulizer solution  0.25 mg Nebulization BID  . buPROPion  150 mg Oral Daily  . fluticasone furoate-vilanterol  1 puff Inhalation Daily  . furosemide  40 mg Intravenous Daily  . gabapentin  600 mg Oral TID  . gemfibrozil  600 mg Oral BID AC  . heparin  5,000 Units Subcutaneous 3 times per day  . insulin aspart  0-20 Units Subcutaneous TID WC  . insulin aspart  0-5 Units Subcutaneous QHS  . insulin aspart  4 Units Subcutaneous TID WC  . insulin glargine  20 Units Subcutaneous QHS    . levofloxacin  750 mg Oral QHS  . linagliptin  5 mg Oral Daily  . lisinopril  40 mg Oral Daily  . methylPREDNISolone (SOLU-MEDROL) injection  40 mg Intravenous Q12H  . oxyCODONE  30 mg Oral BID  . pantoprazole  40 mg Oral BID  . sodium chloride flush  3 mL Intravenous Q12H  . umeclidinium bromide  1 puff Inhalation Daily   Continuous Infusions:  PRN Meds:.fluticasone, gi cocktail, HYDROmorphone (DILAUDID) injection, LORazepam, morphine injection  Micro Results Recent Results (from the past 240 hour(s))  MRSA PCR Screening     Status: None   Collection Time: 07/24/15  2:55 AM  Result Value Ref Range Status   MRSA by PCR NEGATIVE NEGATIVE Final    Comment:        The GeneXpert MRSA Assay (FDA approved for NASAL specimens only), is one component of a comprehensive MRSA colonization surveillance program. It is not intended to diagnose MRSA infection nor to guide or monitor treatment for MRSA infections.     Radiology Reports Dg Chest 2 View  07/23/2015  CLINICAL DATA:  Cough and dyspnea for several weeks. Hemoptysis  tonight. EXAM: CHEST  2 VIEW COMPARISON:  06/16/2015 FINDINGS: There is mild hyperinflation. There is moderate vascular and interstitial prominence which has worsened. There is increased interstitial fluid or thickening in the basilar periphery. There is worsened alveolar opacity in the lingula. These findings may represent congestive heart failure with asymmetric alveolar edema. There is no pleural effusion. IMPRESSION: Worsened vascular and interstitial changes. Worsened lingular alveolar opacity. This may represent congestive failure but an infectious infiltrate is not entirely excluded. Electronically Signed   By: Andreas Newport M.D.   On: 07/23/2015 21:21   Dg Clavicle Right  07/09/2015  Clinical:  History of clavicle fracture several weeks old. X-rays were taken of the right clavicle, two views: There is a comminuted midshaft clavicle fracture on the right with  two small butterfly fragments present.  There is bayonet positioning with the proximal portion more superior.  Little call us is seen.   07/09/2015   Comminuted midshaft fracture of the right clavicle.  Ct Angio Chest Pe W/cm &/or Wo Cm  07/24/2015  CLINICAL DATA:  Hemoptysis. Sudden onset of chest pain yesterday afternoon. Pain radiates to the back. EXAM: CT ANGIOGRAPHY CHEST WITH CONTRAST TECHNIQUE: Multidetector CT imaging of the chest was performed using the standard protocol during bolus administration of intravenous contrast. Multiplanar CT image reconstructions and MIPs were obtained to evaluate the vascular anatomy. CONTRAST:  100 mL Isovue 370 IV COMPARISON:  Chest radiographs 5 hours prior.  Chest CT 12/20/2010 FINDINGS: Mediastinum/Lymph Nodes: Extensive multifocal mediastinal adenopathy. Enlarged lymph nodes in the right upper paratracheal, highest mediastinum, right lower paratracheal, AP window, and subcarinal stations. Subcarinal soft tissue density is unable to be separated from the esophagus, however measures at least 5.6 cm transverse dimension. Extensive left hilar soft tissue density, contiguous with multi focal nodular soft tissue density in the left lower lobe. This soft tissue density leads to narrowing of the left lower lobe pulmonary arteries without definite invasion. No discrete filling defects within the pulmonary arteries. Some degree of occlusion of the left lower lobe pulmonary vein. Thoracic aorta is normal in caliber. No pericardial effusion. There is a prominent right hilar lymph node. Heart is upper limits of normal in size, there are coronary artery calcifications. Lungs/Pleura: Multifocal innumerable left lower lobe nodules, confluent in the infrahilar region, with a masslike confluence measuring 3.2 x 3.2 cm. More superiorly this confluence measures 4.1 cm 4.1 x 3.5 cm and includes a segmental branch of the left lower lobe pulmonary artery. Associated diffuse ground-glass and  ill-defined opacity in the left lower lobe. Paramediastinal nodule in the lingula measures 1.5 cm. There is fissural thickening of the interlobar fissure. Diffuse septal thickening in the left lower lobe. Bubbly dependent debris in the left mainstem bronchus extending into the lower lobe with bronchial thickening and luminal narrowing. No definite nodule in the right lung. Moderate emphysema. Trace left pleural effusion. No right pleural effusion. Upper abdomen: Portions of both adrenal glands are normal, however not entirely excluded. No evidence of focal lesion in the liver allowing for phase of contrast. No acute abnormality in the included upper abdomen. Musculoskeletal: Right mid clavicle fracture with minimal surrounding callus formation. No blastic or destructive lytic lesions. Review of the MIP images confirms the above findings. IMPRESSION: 1. Extensive masslike nodular opacities in the left lower lobe, with a confluent infrahilar mass measuring 3.2 x 3.2 cm, many of which are contiguous with left hilar soft tissue density. There is extensive multifocal mediastinal adenopathy. Findings consistent with malignancy, with  primary likely bronchogenic in the left lower lobe. These masslike opacities encase the left lower lobe pulmonary artery leads luminal narrowing but no discrete invasion or frank pulmonary embolus. Multiple ground-glass opacities in the left lower lobe, may be pneumonitis, postobstructive atelectasis or spread of malignancy. 2. Additional lingular nodule measures 1.5 cm. 3. Bubbly density in the left mainstem bronchus and left lower lobe, with left lung bronchial thickening. This is likely secretions or aspiration. 4. Moderate emphysema. Electronically Signed   By: Jeb Levering M.D.   On: 07/24/2015 02:17    Time Spent in minutes   30   SINGH,PRASHANT K M.D on 07/26/2015 at 11:10 AM  Between 7am to 7pm - Pager - [phone]  After 7pm go to [email or web address] - password [facility]  [facility] -  Office  [phone]

## 2015-07-26 NOTE — Care Management Important Message (Signed)
Important Message  Patient Details  Name: [name] MRN: [id number] Date of Birth: November 07, 1950   Medicare Important Message Given:  Yes    Nathen May 07/26/2015, 10:39 AM

## 2015-07-26 NOTE — Consult Note (Signed)
Name: [name] MRN: [id number] DOB: October 05, 1950    ADMISSION DATE:  07/23/2015 CONSULTATION DATE:  4/7  REFERRING MD : [facility]  CHIEF COMPLAINT:  Chest pain  BRIEF PATIENT DESCRIPTION: WNWD male with hemotysis  SIGNIFICANT EVENTS    STUDIES:     HISTORY OF PRESENT ILLNESS:   [name] is a pleasant 65 yo disabled(chronic back pain on OxyContin 90 mg daily) 1-2 PPD smoker despite basal cancer of right eat 2000,who has had respiratory distress x 8 weeks. 8 weeks ago noted cough(non productive) increased SOB with out desaturation(use sat monitor due to OSA and cpap use) negative fever chills and sweats. Positive nocturnal gurgling(hx of esophogeal stricture with dilatation 5/12 following Ant cervical diskectomy ) that wakes him from sleep on  Cpap. 6 weeks ago he developed streaky hemoptysis, left chest pain and increased drowsiness(note high dose narcotics)and he actually fell off a stool and fx right clavicle. He proved refractory to opt tx and was scheduled to see Dr. Vaughan Browner 4/7 at 0900 but ws admitted to [facility] due to acute chest pain. CT of chest was negative for PE but positive for left lung mass. Evaluated by Dr. Luan Pulling at [facility] and transferred to [facility] for pulmonary evaluation. His chest pain has improved and he is in no acute distress at time of evaluation.  He does appear to have pna on CT scan. He will also need GI eval for his esophogeal issues. Note 10 lb weight loss over one month.  PAST MEDICAL HISTORY :   has a past medical history of Essential hypertension, benign; Type 2 diabetes mellitus ([place]); Mixed hyperlipidemia; OSA (obstructive sleep apnea); Partial small bowel obstruction ([place]); Stevens-Johnson syndrome ([place]); Coronary atherosclerosis of native coronary artery; Erythrocytosis; Peripheral arterial disease ([place]); History of MRSA infection; Cancer ([place]) (2015); and Neuropathy ([place]) (2013).  has past surgical history that includes Appendectomy; Neck surgery; Back surgery;  Hemorrhoid surgery; and Vasectomy.   Prior to Admission medications   Medication Sig Start Date End Date Taking? Authorizing Provider  albuterol (PROVENTIL HFA;VENTOLIN HFA) 108 (90 Base) MCG/ACT inhaler Inhale 2 puffs into the lungs every 4 (four) hours as needed for wheezing or shortness of breath. 07/18/15  Yes Fransisca Kaufmann Dettinger, MD  amLODipine (NORVASC) 10 MG tablet Take 10 mg by mouth daily.   Yes Historical Provider, MD  atenolol (TENORMIN) 100 MG tablet Take 1 tablet (100 mg total) by mouth daily. 04/04/15  Yes Fransisca Kaufmann Dettinger, MD  atorvastatin (LIPITOR) 20 MG tablet Take 1 tablet (20 mg total) by mouth daily. 04/04/15  Yes Fransisca Kaufmann Dettinger, MD  buPROPion (WELLBUTRIN XL) 150 MG 24 hr tablet TAKE 1 TABLET BY MOUTH DAILY 07/23/15  Yes Fransisca Kaufmann Dettinger, MD  Doxepin HCl 5 % CREA Apply 1 application topically daily as needed (pain).  07/22/15  Yes Historical Provider, MD  doxycycline (VIBRA-TABS) 100 MG tablet Take 1 tablet (100 mg total) by mouth 2 (two) times daily. 1 po bid 07/18/15  Yes Fransisca Kaufmann Dettinger, MD  fluticasone (FLONASE) 50 MCG/ACT nasal spray Place 1 spray into both nostrils 2 (two) times daily as needed for allergies or rhinitis. 05/15/15  Yes Fransisca Kaufmann Dettinger, MD  fluticasone furoate-vilanterol (BREO ELLIPTA) 100-25 MCG/INH AEPB Inhale 1 puff into the lungs daily. 07/18/15  Yes Fransisca Kaufmann Dettinger, MD  gabapentin (NEURONTIN) 600 MG tablet TAKE TWO (2) TABLETS THREE (3) TIMES DAILY 04/29/15  Yes Fransisca Kaufmann Dettinger, MD  gemfibrozil (LOPID) 600 MG tablet Take 600 mg by mouth 2 (  two) times daily before a meal.    Yes Historical Provider, MD  Insulin Degludec (TRESIBA FLEXTOUCH) 200 UNIT/ML SOPN Inject 95 Units into the skin daily. 05/08/15  Yes Fransisca Kaufmann Dettinger, MD  JANUVIA 100 MG tablet TAKE 1 TABLET BY MOUTH DAILY 07/23/15  Yes Fransisca Kaufmann Dettinger, MD  lisinopril-hydrochlorothiazide (PRINZIDE,ZESTORETIC) 20-12.5 MG tablet TAKE 2 TABLETS BY MOUTH DAILY. 07/23/15  Yes Fransisca Kaufmann Dettinger,  MD  metFORMIN (GLUCOPHAGE) 1000 MG tablet Take 1,000 mg by mouth 2 (two) times daily with a meal.  11/14/13  Yes Historical Provider, MD  NOVOLOG FLEXPEN 100 UNIT/ML FlexPen INJECT UP TO 40 UNITS UP TO FOUR TIMES A DAY 06/24/15  Yes Fransisca Kaufmann Dettinger, MD  omeprazole (PRILOSEC) 40 MG capsule Take 40 mg by mouth daily.    Yes Historical Provider, MD  oxyCODONE (OXYCONTIN) 30 MG 12 hr tablet Take 30 mg by mouth 2 (two) times daily.  01/07/15  Yes Historical Provider, MD  oxyCODONE-acetaminophen (PERCOCET/ROXICET) 5-325 MG tablet Take 1 tablet by mouth every 6 (six) hours as needed. Patient taking differently: Take 1 tablet by mouth every 6 (six) hours as needed for moderate pain.  06/16/15  Yes Lily Kocher, PA-C  predniSONE (DELTASONE) 20 MG tablet 2 po at same time daily for 5 days Patient taking differently: Take 40 mg by mouth daily. 2 po at same time daily for 5 days 07/18/15  Yes Fransisca Kaufmann Dettinger, MD  tiZANidine (ZANAFLEX) 4 MG tablet Take 4 mg by mouth 2 (two) times daily as needed for muscle spasms.   Yes Historical Provider, MD  umeclidinium bromide (INCRUSE ELLIPTA) 62.5 MCG/INH AEPB Inhale 1 puff into the lungs daily. 07/18/15  Yes Fransisca Kaufmann Dettinger, MD  Insulin Pen Needle (PEN NEEDLES) 31G X 6 MM MISC 1 each by Does not apply route 4 (four) times daily. Use with insulin pens to inject insulin up to 5 times daily 03/20/15   Cherre Robins, PHARMD   Allergies  Allergen Reactions  . Nsaids Other (See Comments)    ulcers  . Prednisone Other (See Comments)    High sugar levels  . Vancomycin     Caused Stevens-Johnsons syndrome    FAMILY HISTORY:  family history includes Cancer in his mother; Depression in his mother; Diabetes in his mother; Heart disease in his maternal grandfather; Hyperlipidemia in his maternal grandfather; Hypertension in his maternal grandfather. SOCIAL HISTORY:  reports that he has been smoking Cigarettes.  He has a 40 pack-year smoking history. He has never used  smokeless tobacco. He reports that he does not drink alcohol or use illicit drugs.  REVIEW OF SYSTEMS:   10 point review of system taken, please see HPI for positives and negatives.   SUBJECTIVE:   VITAL SIGNS: Temp:  [97.6 F (36.4 C)-98 F (36.7 C)] 97.8 F (36.6 C) (04/07 0703) Pulse Rate:  [75-82] 82 (04/07 0703) Resp:  [18-20] 18 (04/07 0703) BP: (145-155)/(57-85) 155/85 mmHg (04/07 0703) SpO2:  [93 %-99 %] 99 % (04/07 0703)  PHYSICAL EXAMINATION: General:  WNWDWM NAD Neuro: Intact but no STM possibly from high dose narcs HEENT:  +VCD, No jvd Cardiovascular: HSR RRR Lungs:  Mild rhonchi ++ VCD Abdomen:  Soft nt, +bs Musculoskeletal:  intact Skin:  Warm and dry   Recent Labs Lab 07/23/15 2241 07/24/15 0334 07/25/15 0504  NA 132* 132* 135  K 3.8 3.8 4.0  CL 95* 94* 97*  CO2 '26 25 24  '$ BUN 26* 27* 44*  CREATININE 1.17 1.15 1.08  GLUCOSE 105* 153* 175*    Recent Labs Lab 07/23/15 2241 07/24/15 0334 07/25/15 0504  HGB 11.5* 12.6* 11.5*  HCT 34.8* 38.0* 35.1*  WBC 13.9* 14.6* 13.2*  PLT 197 208 238   No results found.  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 8 weeks ago noted cough(non productive) increased SOB with out desaturation(use sat monitor  due to OSA and cpap use) negative fever chills and sweats. Positive nocturnal gurgling(hx of esophogeal stricture with dilatation 5/12 following Ant cervical diskectomy ) that wakes him from sleep on  Cpap. 6 weeks ago he developed streaky hemoptysis, left chest pain and increased drowsiness(note high dose narcotics)and he actually fell off a stool and fx right clavicle. He proved refractory to opt tx and was scheduled to see Dr. Vaughan Browner 4/7 at 0900 but ws admitted to [facility] due to acute chest pain. CT of chest was negative for PE but positive for left lung mass. Evaluated by Dr. Luan Pulling at [facility] and transferred to [facility] for pulmonary evaluation. His chest pain has improved and he is in no acute distress at time of evaluation.  He does appear to have pna on CT scan. He will also need GI eval for his esophogeal issues.        PLAN: Agree with abx Plan for FOB as outpt (or inpt if he stays) BD's as needed PPI GI consult Cpap.  Richardson Landry Minor ACNP Maryanna Shape PCCM Pager [phone] till 3 pm If no answer page [phone] 07/26/2015, 9:52 AM   Attending Note:  I have examined patient, reviewed labs, studies and notes. I have discussed the case with S Minor, and I agree with the data and plans as amended above. This looks like small cell CA. Bulky mediastinal and L hilar mass, probable endobronchial lesion based on his hemoptysis. I have scheduled him for outpt FOB on 4/12 at 2:00 at [facility]. If I am unable to obtain a dx then I will arrange for outpt EBUS. Ok for him to d/c home from my standpoint, get procedure next week.   Baltazar Apo, MD, PhD 07/26/2015, 12:50 PM [facility] [phone] or if no answer [phone]

## 2015-07-26 NOTE — Consult Note (Signed)
NAMEDATON, SZILAGYI              ACCOUNT NO.:  [id number]  MEDICAL RECORD NO.:  [id number]  LOCATION:  A327                          FACILITY:  [facility]  PHYSICIAN:  Murel Wigle L. Luan Pulling, M.D.DATE OF BIRTH:  [birth year]  DATE OF CONSULTATION: DATE OF DISCHARGE:                                CONSULTATION   HISTORY OF PRESENT ILLNESS:  Mr. Gains is a 65 year old, with a known history of COPD, hypertension, hyperlipidemia, diabetes, obstructive sleep apnea, and previous fracture of his collarbone.  He came to the emergency department with chest pain which is mostly on the left side and radiates into his back.  He had been having a cough and shortness of breath as well.  He underwent chest x-ray that was not normal and then underwent CT scan, that is severely abnormal and very suggestive that he has a primary lung cancer.  He had mediastinal adenopathy and what appears to be an obstructive pneumonia.  REVIEW OF SYSTEMS:  He has not lost any weight.  He has not had any hemoptysis.  He has not had any nausea, vomiting, diarrhea. GASTROINTESTINAL:  He has not had urinary frequency, flank pain.  He has had some pain in his joints.  PAST MEDICAL HISTORY:  Positive for hypertension, diabetes, hyperlipidemia, obstructive sleep apnea, history of Katherina Right syndrome with vancomycin, coronary artery disease which is nonobstructive, peripheral arterial disease, skin cancer, and diabetic neuropathy.  PAST SURGICAL HISTORY:  Surgically, he has had an appendectomy, neck surgery, back surgery, hemorrhoid surgery, and vasectomy.  MEDICATIONS:  His home medications have been reviewed and his hospital medications have been reviewed also.  ALLERGIES:  He is allergic to NONSTEROIDAL ANTI-INFLAMMATORY DRUGS, PREDNISONE, and VANCOMYCIN.  SOCIAL HISTORY:  He has about a 40 pack year smoking history.  He does not use any alcohol or illicit drugs.  FAMILY HISTORY:  He has a family history of cancer  in his mother and heart disease in several other family members.  PHYSICAL EXAMINATION:  GENERAL:  A well-developed, somewhat obese male, who is in no acute distress. VITAL SIGNS:  Blood pressure 130/70, pulse 80, respirations 16. HEENT:  His pupils are reactive.  Nose and throat are clear.  Mucous membranes are moist. NECK:  Supple. CHEST:  Relatively clear with some rhonchi on the left.  He does not have any palpable adenopathy. HEART:  Regular without gallop. ABDOMEN:  Soft.  No masses are felt. EXTREMITIES:  No edema. CENTRAL NERVOUS SYSTEM:  Grossly intact.  I reviewed the findings of his chest CT with him and told him that I am concerned that he has a primary lung cancer.  I do not think that I can get to this lesion and make a diagnosis with bronchoscopy without doing a transbronchial biopsy, which I am not doing at our institution now.  I think the best thing for him is going to be to be transferred to [facility] and have Pulmonary/Thoracic Surgery consultation for definitive diagnosis.  Thank you very much for allowing me to see him with you.     Rochelle Nephew L. Luan Pulling, M.D.     ELH/MEDQ  D:  07/25/2015  T:  07/26/2015  Job:  [id number]

## 2015-07-26 NOTE — Consult Note (Signed)
[facility] Consult: 1:48 PM 07/26/2015  LOS: 2 days    Referring Provider: Dr Candiss Norse  Primary Care Physician:  Fransisca Kaufmann Dettinger, MD  [facility] Primary Gastroenterologist:  unassigned     Reason for Consultation:  "Melena"   HPI: [name] is a 65 y.o. male.  Hx OSA on CPAP.  Chronic narcotics for spinal pain, s/p multiple spine surgeries.  Insulin dependent type 2 DM.  ASPVD.  COPD.  Smoker.  Fatty liver (though not seen on CT in 2020) but no hx of ETOH excess (does not drink). PUD and esophageal dilatations, colonoscopy >20 yrs ago.  Previous I and D of rectal hemorrhoids x 2 and later hemorrhoidectomy: all > 20 yrs ago.  Hx SBO 2010, no surgery.  Broke right clavicle 5 weeks ago (due to falling asleep and falling off the chair) and using up to 1600 mg Ibuprofen daily since then.    Admitted with 6 weeks progressive hemoptysis, chest pain and dyspnea.  Chest CT showed left lung mass and PNA.  Had been treated with a zpack several weeks ago which provided temporary improvement.  ROS + for 2 plus years of dysphagia, mostly to solids.  Infrequently regurgitates food he can not swallow.  Sees blood with straining at stool, but rarely.  Occasional dark but not melenic sounding stools  Weight loss of 10 # in last several weeks coinciding with anorexia but no n/v.  GERD sxs controlled with BID Omeprazole.  No ETOH.   + ventral hernia which is tender to touch but not spontaneously painful.       Hgb stable ~ 11.5 to 12.5 but was 17 to 18 in 2012  Past Medical History  Diagnosis Date  . Essential hypertension, benign   . Type 2 diabetes mellitus ([place])   . Mixed hyperlipidemia   . OSA (obstructive sleep apnea)   . Partial small bowel obstruction ([place])   . 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  Coronary atherosclerosis of native coronary artery     Nonobstructive  . Erythrocytosis   . Peripheral arterial disease (HCC)     70% right iliac  . History of MRSA infection   . Cancer ([place]) 2015    skin  . Neuropathy ([place]) 2013    Past Surgical History  Procedure Laterality Date  . Appendectomy    . Neck surgery    . Back surgery    . Hemorrhoid surgery    . Vasectomy      Prior to Admission medications   Medication Sig Start Date End Date Taking? Authorizing Provider  albuterol (PROVENTIL HFA;VENTOLIN HFA) 108 (90 Base) MCG/ACT inhaler Inhale 2 puffs into the lungs every 4 (four) hours as needed for wheezing or shortness of breath. 07/18/15  Yes Fransisca Kaufmann Dettinger, MD  amLODipine (NORVASC) 10 MG tablet Take 10 mg by mouth daily.   Yes Historical Provider, MD  atenolol (TENORMIN) 100 MG tablet Take 1 tablet (100 mg total) by mouth daily. 04/04/15  Yes Fransisca Kaufmann Dettinger, MD  atorvastatin (LIPITOR)  20 MG tablet Take 1 tablet (20 mg total) by mouth daily. 04/04/15  Yes Fransisca Kaufmann Dettinger, MD  buPROPion (WELLBUTRIN XL) 150 MG 24 hr tablet TAKE 1 TABLET BY MOUTH DAILY 07/23/15  Yes Fransisca Kaufmann Dettinger, MD  Doxepin HCl 5 % CREA Apply 1 application topically daily as needed (pain).  07/22/15  Yes Historical Provider, MD  doxycycline (VIBRA-TABS) 100 MG tablet Take 1 tablet (100 mg total) by mouth 2 (two) times daily. 1 po bid 07/18/15  Yes Fransisca Kaufmann Dettinger, MD  fluticasone (FLONASE) 50 MCG/ACT nasal spray Place 1 spray into both nostrils 2 (two) times daily as needed for allergies or rhinitis. 05/15/15  Yes Fransisca Kaufmann Dettinger, MD  fluticasone furoate-vilanterol (BREO ELLIPTA) 100-25 MCG/INH AEPB Inhale 1 puff into the lungs daily. 07/18/15  Yes Fransisca Kaufmann Dettinger, MD  gabapentin (NEURONTIN) 600 MG tablet TAKE TWO (2) TABLETS THREE (3) TIMES DAILY 04/29/15  Yes Fransisca Kaufmann Dettinger, MD  gemfibrozil (LOPID) 600 MG tablet Take 600 mg by mouth 2 (two) times daily before a meal.    Yes Historical  Provider, MD  Insulin Degludec (TRESIBA FLEXTOUCH) 200 UNIT/ML SOPN Inject 95 Units into the skin daily. 05/08/15  Yes Fransisca Kaufmann Dettinger, MD  JANUVIA 100 MG tablet TAKE 1 TABLET BY MOUTH DAILY 07/23/15  Yes Fransisca Kaufmann Dettinger, MD  lisinopril-hydrochlorothiazide (PRINZIDE,ZESTORETIC) 20-12.5 MG tablet TAKE 2 TABLETS BY MOUTH DAILY. 07/23/15  Yes Fransisca Kaufmann Dettinger, MD  metFORMIN (GLUCOPHAGE) 1000 MG tablet Take 1,000 mg by mouth 2 (two) times daily with a meal.  11/14/13  Yes Historical Provider, MD  NOVOLOG FLEXPEN 100 UNIT/ML FlexPen INJECT UP TO 40 UNITS UP TO FOUR TIMES A DAY 06/24/15  Yes Fransisca Kaufmann Dettinger, MD  omeprazole (PRILOSEC) 40 MG capsule Take 40 mg by mouth daily.    Yes Historical Provider, MD  oxyCODONE (OXYCONTIN) 30 MG 12 hr tablet Take 30 mg by mouth 2 (two) times daily.  01/07/15  Yes Historical Provider, MD  oxyCODONE-acetaminophen (PERCOCET/ROXICET) 5-325 MG tablet Take 1 tablet by mouth every 6 (six) hours as needed. Patient taking differently: Take 1 tablet by mouth every 6 (six) hours as needed for moderate pain.  06/16/15  Yes Lily Kocher, PA-C  predniSONE (DELTASONE) 20 MG tablet 2 po at same time daily for 5 days Patient taking differently: Take 40 mg by mouth daily. 2 po at same time daily for 5 days 07/18/15  Yes Fransisca Kaufmann Dettinger, MD  tiZANidine (ZANAFLEX) 4 MG tablet Take 4 mg by mouth 2 (two) times daily as needed for muscle spasms.   Yes Historical Provider, MD  umeclidinium bromide (INCRUSE ELLIPTA) 62.5 MCG/INH AEPB Inhale 1 puff into the lungs daily. 07/18/15  Yes Fransisca Kaufmann Dettinger, MD  Insulin Pen Needle (PEN NEEDLES) 31G X 6 MM MISC 1 each by Does not apply route 4 (four) times daily. Use with insulin pens to inject insulin up to 5 times daily 03/20/15   Tammy Eckard, PHARMD    Scheduled Meds: . amLODipine  10 mg Oral Daily  . atenolol  100 mg Oral Daily  . atorvastatin  20 mg Oral Daily  . budesonide (PULMICORT) nebulizer solution  0.25 mg Nebulization BID  .  buPROPion  150 mg Oral Daily  . fluticasone furoate-vilanterol  1 puff Inhalation Daily  . gabapentin  600 mg Oral TID  . gemfibrozil  600 mg Oral BID AC  . insulin aspart  0-20 Units Subcutaneous TID WC  . insulin aspart  0-5 Units  Subcutaneous QHS  . insulin aspart  4 Units Subcutaneous TID WC  . insulin glargine  20 Units Subcutaneous QHS  . levofloxacin  750 mg Oral QHS  . linagliptin  5 mg Oral Daily  . lisinopril  40 mg Oral Daily  . metronidazole  500 mg Intravenous Q8H  . oxyCODONE  30 mg Oral BID  . pantoprazole  40 mg Oral BID  . sodium chloride flush  3 mL Intravenous Q12H  . umeclidinium bromide  1 puff Inhalation Daily   Infusions:   PRN Meds: fluticasone, gi cocktail, HYDROmorphone (DILAUDID) injection, LORazepam, morphine injection   Allergies as of 07/23/2015 - Review Complete 07/23/2015  Allergen Reaction Noted  . Nsaids Other (See Comments) 02/27/2015  . Prednisone Other (See Comments) 02/27/2015  . Vancomycin  02/12/2011    Family History  Problem Relation Age of Onset  . Coronary artery disease    . Hypertension    . Cancer Mother   . Depression Mother   . Diabetes Mother   . Heart disease Maternal Grandfather   . Hyperlipidemia Maternal Grandfather   . Hypertension Maternal Grandfather     Social History   Social History  . Marital Status: Divorced    Spouse Name: N/A  . Number of Children: 7  . Years of Education: N/A   Occupational History  . UNEMPLOYED    Social History Main Topics  . Smoking status: Current Every Day Smoker -- 1.00 packs/day for 40 years    Types: Cigarettes  . Smokeless tobacco: Never Used  . Alcohol Use: No  . Drug Use: No  . Sexual Activity: Not on file   Other Topics Concern  . Not on file   Social History Narrative    REVIEW OF SYSTEMS: Constitutional:  Weakness, fatigues easily ENT:  No nose bleeds Pulm:  Per HPI CV:  No palpitations, no LE edema.  GU:  No hematuria, no frequency GI:  Per  HPI Heme:  Per HPI.  No transfusions before   Transfusions:  none Neuro:  No headaches, no peripheral tingling or numbness Derm:  No itching, no rash or sores.  Endocrine:  No sweats or chills.  No polyuria or dysuria Immunization:  Not queried Travel:  None beyond local counties in last few months.    PHYSICAL EXAM: Vital signs in last 24 hours: Filed Vitals:   07/26/15 0500 07/26/15 0703  BP: 145/57 155/85  Pulse: 75 82  Temp: 98 F (36.7 C) 97.8 F (36.6 C)  Resp: 20 18   Wt Readings from Last 3 Encounters:  07/24/15 95.618 kg (210 lb 12.8 oz)  07/18/15 93.169 kg (205 lb 6.4 oz)  07/09/15 93.895 kg (207 lb)    General: chronically ill/unwell, dishevelled, obese WM Head:  No asymmetry.  + cushingoid  Eyes:  No icterus or pallor Ears:  Not HOH  Nose:  No congestion or discharge Mouth:  Clear, upper dentures, lower teeth in poor condition Neck:  No mass , no TMG, no JVD Lungs:  Diminished but clear.  + cough, + mild dyspnea with speaking Heart: RRR,.  No mrg.   Abdomen:  Soft, ND.  Only tenderness is at upper midline ventral hernia, this is reduced with pt laying down.  No mass or HSM.   Rectal: hemorrhoidal tags externally.  No internal masses.  No stool to sample   Musc/Skeltl: scar at cervical base.  Extremities:    Neurologic:  Oriented x 3, speech slow and slurring seemingly from narcotics.  Able to walk without assistance but gait is slow and a bit unsteady.  Skin:  Dry skin and violaceous discoloration of both legs from knees down.  No LE sores   Psych:  Cooperative, calm.   Intake/Output from previous day: 04/06 0701 - 04/07 0700 In: 480 [P.O.:480] Out: -  Intake/Output this shift: Total I/O In: 240 [P.O.:240] Out: -   LAB RESULTS:  Recent Labs  07/23/15 2241 07/24/15 0334 07/25/15 0504  WBC 13.9* 14.6* 13.2*  HGB 11.5* 12.6* 11.5*  HCT 34.8* 38.0* 35.1*  PLT 197 208 238   BMET Lab Results  Component Value Date   NA 135 07/25/2015   NA 132*  07/24/2015   NA 132* 07/23/2015   K 4.0 07/25/2015   K 3.8 07/24/2015   K 3.8 07/23/2015   CL 97* 07/25/2015   CL 94* 07/24/2015   CL 95* 07/23/2015   CO2 24 07/25/2015   CO2 25 07/24/2015   CO2 26 07/23/2015   GLUCOSE 175* 07/25/2015   GLUCOSE 153* 07/24/2015   GLUCOSE 105* 07/23/2015   BUN 44* 07/25/2015   BUN 27* 07/24/2015   BUN 26* 07/23/2015   CREATININE 1.08 07/25/2015   CREATININE 1.15 07/24/2015   CREATININE 1.17 07/23/2015   CALCIUM 9.3 07/25/2015   CALCIUM 8.7* 07/24/2015   CALCIUM 8.6* 07/23/2015   LFT No results for input(s): PROT, ALBUMIN, AST, ALT, ALKPHOS, BILITOT, BILIDIR, IBILI in the last 72 hours. PT/INR Lab Results  Component Value Date   INR 0.9 01/15/2008   Hepatitis Panel No results for input(s): HEPBSAG, HCVAB, HEPAIGM, HEPBIGM in the last 72 hours. C-Diff No components found for: CDIFF Lipase     Component Value Date/Time   LIPASE 44 06/14/2008 1918    Drugs of Abuse  No results found for: LABOPIA, COCAINSCRNUR, LABBENZ, AMPHETMU, THCU, LABBARB   RADIOLOGY STUDIES: No results found.  ENDOSCOPIC STUDIES: Per HPI  IMPRESSION:   *  Dysphagia for years.  Previous remote esoph dilatation. Rule out stricuture, rule out malignancy.     *   "melena"  Pt denies, currently stools not melenic.  FOBT status to be determined.   Remote colonoscopy, EGD and esophageal dilatation with PUD.  Takes Omeprazole BID at home.   *  Left lung mass, looks like small cell.   Bronch and biopsy set for 4/12 , inpt vs outpt. Also on abx for pna.   *  IDDM.        PLAN:     *  Needs EGD, timing of this per Dr Loletha Carrow.   *  Continue PPI as per his routine.      Azucena Freed  07/26/2015, 1:48 PM Pager: [phone]  I have reviewed the entire case in detail with the above APP and discussed the plan in detail.  Therefore, I agree with the diagnoses recorded above. In addition,  I have personally interviewed and examined the patient and have personally  reviewed any abdominal/pelvic CT scan images.  My additional thoughts are as follows:  Years of chronic dysphagia, solid food often feels stuck in the neck.  He believes it started after a cervical spine surgery, which raises the suspicion of osteophytes causing proximal esophageal compression.  Seems unrelated to the new Dx of lung CA  I have ordered a barium swallow to get a better idea what may be going on.  Given the new Dx of lung CA and probable concomitant pneumonia, now does not seem like the best time to be sedated  for an EGD unless absolutely necessary.    Nelida Meuse III Pager [phone]  Mon-Fri 8a-5p [phone] after 5p, weekends, holidays

## 2015-07-27 LAB — GLUCOSE, CAPILLARY
GLUCOSE-CAPILLARY: 139 mg/dL — AB (ref 65–99)
Glucose-Capillary: 161 mg/dL — ABNORMAL HIGH (ref 65–99)

## 2015-07-27 MED ORDER — AMOXICILLIN-POT CLAVULANATE 875-125 MG PO TABS: 1.0000 | ORAL_TABLET | Freq: Two times a day (BID) | ORAL | Status: DC

## 2015-07-27 NOTE — Discharge Instructions (Signed)
Follow with Primary MD Fransisca Kaufmann Dettinger, MD in 7 days   Get CBC, CMP, 2 view Chest X ray checked  by Primary MD next visit.    Activity: As tolerated with Full fall precautions use walker/cane & assistance as needed   Disposition Home     Diet:   Heart Healthy Low Carb  with feeding assistance and aspiration precautions.  For Heart failure patients - Check your Weight same time everyday, if you gain over 2 pounds, or you develop in leg swelling, experience more shortness of breath or chest pain, call your Primary MD immediately. Follow Cardiac Low Salt Diet and 1.5 lit/day fluid restriction.   On your next visit with your primary care physician please Get Medicines reviewed and adjusted.   Please request your Prim.MD to go over all Hospital Tests and Procedure/Radiological results at the follow up, please get all Hospital records sent to your Prim MD by signing hospital release before you go home.   If you experience worsening of your admission symptoms, develop shortness of breath, life threatening emergency, suicidal or homicidal thoughts you must seek medical attention immediately by calling 911 or calling your MD immediately  if symptoms less severe.  You Must read complete instructions/literature along with all the possible adverse reactions/side effects for all the Medicines you take and that have been prescribed to you. Take any new Medicines after you have completely understood and accpet all the possible adverse reactions/side effects.   Do not drive, operating heavy machinery, perform activities at heights, swimming or participation in water activities or provide baby sitting services if your were admitted for syncope or siezures until you have seen by Primary MD or a Neurologist and advised to do so again.  Do not drive when taking Pain medications.    Do not take more than prescribed Pain, Sleep and Anxiety Medications  Special Instructions: If you have smoked or chewed  Tobacco  in the last 2 yrs please stop smoking, stop any regular Alcohol  and or any Recreational drug use.  Wear Seat belts while driving.   Please note  You were cared for by a hospitalist during your hospital stay. If you have any questions about your discharge medications or the care you received while you were in the hospital after you are discharged, you can call the unit and asked to speak with the hospitalist on call if the hospitalist that took care of you is not available. Once you are discharged, your primary care physician will handle any further medical issues. Please note that NO REFILLS for any discharge medications will be authorized once you are discharged, as it is imperative that you return to your primary care physician (or establish a relationship with a primary care physician if you do not have one) for your aftercare needs so that they can reassess your need for medications and monitor your lab values.

## 2015-07-27 NOTE — Evaluation (Signed)
Clinical/Bedside Swallow Evaluation Patient Details  Name: [name] MRN: [id number] Date of Birth: November 17, 1950  Today's Date: 07/27/2015 Time: SLP Start Time (ACUTE ONLY): 1100 SLP Stop Time (ACUTE ONLY): 1124 SLP Time Calculation (min) (ACUTE ONLY): 24 min  Past Medical History:  Past Medical History  Diagnosis Date  . Essential hypertension, benign   . Type 2 diabetes mellitus ([place])   . Mixed hyperlipidemia   . OSA (obstructive sleep apnea)   . Partial small bowel obstruction ([place])   . 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 Coronary atherosclerosis of native coronary artery     Nonobstructive  . Erythrocytosis   . Peripheral arterial disease (H[facility])     70% right iliac  . History of MRSA infection   . Cancer ([place]) 2015    skin  . Neuropathy ([place]) 2013   Past Surgical History:  Past Surgical History  Procedure Laterality Date  . Appendectomy    . Neck surgery    . Back surgery    . Hemorrhoid surgery    . Vasectomy     HPI:  65 y.o. male with hx of years of chronic dysphagia. Hx OSA on CPAP. Chronic narcotics for spinal pain, s/p multiple spine surgeries. Insulin dependent type 2 DM. ASPVD. COPD. Smoker. Fatty liver (though not seen on CT in 2020) but no hx of ETOH excess (does not drink). PUD and esophageal dilatations, colonoscopy >20 yrs ago. Ten pound weight loss.  Admitted with 6 weeks progressive hemoptysis, chest pain and dyspnea. Chest CT showed left lung mass and PNA.   Assessment / Plan / Recommendation Clinical Impression  Pt presents with no s/s of an oropharyngeal dysphagia.  His complaints are related to a chronic dysphagia of likely esophageal origin.  He reports globus, occasional regurgitation (self-induced when he is unable to get relief.)  GI is following with plans for barium swallow and possibly an EGD.  Recommend resuming  a regular diet, thin liquids pending further w/u per GI.  No SLP needs identified- our services will sign off.      Aspiration Risk    minimal   Diet Recommendation   regular solids, thin liquids  Medication Administration: Whole meds with liquid    Other  Recommendations Oral Care Recommendations: Oral care BID   Follow up Recommendations     n/a  Frequency and Duration            Prognosis        Swallow Study   General HPI: 66 y.o. male with hx of years of chronic dysphagia. Hx OSA on CPAP. Chronic narcotics for spinal pain, s/p multiple spine surgeries. Insulin dependent type 2 DM. ASPVD. COPD. Smoker. Fatty liver (though not seen on CT in 2020) but no hx of ETOH excess (does not drink). PUD and esophageal dilatations, colonoscopy >20 yrs ago. Ten pound weight loss.  Admitted with 6 weeks progressive hemoptysis, chest pain and dyspnea. Chest CT showed left lung mass and PNA. Type of Study: Bedside Swallow Evaluation Previous Swallow Assessment: none Diet Prior to this Study: Regular;Thin liquids Temperature Spikes Noted: No Respiratory Status: Room air History of Recent Intubation: No Behavior/Cognition: Alert;Cooperative Oral Cavity Assessment: Within Functional Limits Oral Care Completed by SLP: No Oral Cavity - Dentition: Edentulous (top; presence of bottom teeth) Vision: Functional for self-feeding Self-Feeding Abilities: Able to feed self Patient Positioning: Upright in bed Baseline Vocal Quality: Hoarse Volitional Cough: Strong Volitional Swallow: Able to elicit    Oral/Motor/Sensory Function  Overall Oral Motor/Sensory Function: Within functional limits   Ice Chips Ice chips: Within functional limits   Thin Liquid Thin Liquid: Within functional limits Presentation: Cup;Straw    Nectar Thick Nectar Thick Liquid: Not tested   Honey Thick Honey Thick Liquid: Not tested   Puree Puree: Within functional limits Presentation: Self Fed   Solid   GO   Solid: Within functional limits Presentation: Self Fed        [name] 07/27/2015,11:31  AM

## 2015-07-27 NOTE — Discharge Summary (Signed)
[name], is a 65 y.o. male  DOB July 29, 1950  MRN [id number].  Admission date:  07/23/2015  Admitting Physician  Orvan Falconer, MD  Discharge Date:  07/27/2015   Primary MD  Fransisca Kaufmann Dettinger, MD  Recommendations for primary care physician for things to follow:   Check CBC, BMP and a 2 view chest x-ray in a week.  He will need to follow with pulmonary within 5-7 days and GI in 1-2 weeks   Admission Diagnosis  Atypical chest pain [R07.89] Hypoxia [R09.02] COPD exacerbation (HCC) [J44.1] CAP (community acquired pneumonia) [J18.9] Hemoptysis [R04.2]   Discharge Diagnosis  Atypical chest pain [R07.89] Hypoxia [R09.02] COPD exacerbation ([place]) [J44.1] CAP (community acquired pneumonia) [J18.9] Hemoptysis [R04.2]     Principal Problem:   Chest pain Active Problems:   Essential hypertension, benign   Tobacco abuse   Peripheral arterial disease (HCC)   Type 2 diabetes mellitus without complication, with long-term current use of insulin (HCC)   Obstructive sleep apnea   Fracture of shaft of clavicle   GI bleeding   Chest pain at rest   Atypical chest pain   Lung mass   Hemoptysis   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 Essential hypertension, benign   . Type 2 diabetes mellitus ([place])   . Mixed hyperlipidemia   . OSA (obstructive sleep apnea)   . Partial small bowel obstruction ([place])   . 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 Coronary atherosclerosis of native coronary artery     Nonobstructive  . Erythrocytosis   .  Peripheral arterial disease (HCC)     70% right iliac  . History of MRSA infection   . Cancer ([place]) 2015    skin  . Neuropathy ([place]) 2013    Past Surgical History  Procedure Laterality Date  . Appendectomy    . Neck surgery    . Back surgery    . Hemorrhoid surgery    . Vasectomy         HPI  from the history and physical done on the day of admission:   [name] is an 65 y.o. male with a hx of tobacco use, HLD, essential HTN, DM type 2, OSA, and collarbone Fx 5 weeks ago who presents to the ED with complaints of sudden onset substernal CP radiating to back that onset yesterday, a cough and SOB for several weeks. Reports a "hacking" cough with production of sputum and hemoptysis that appears like 'tomatoe juice'. He reports that exercise exacerbates his CP. He also reports that he has had intermittent CP for years, but never as bad as current episode. Has had a stress test  in the past. He admits to using a CPAP that he has been suing since the 90's, hx of smoking, melena, taking a lot of ibuprofen, and a hx of ulcer. He denies a fever, is unable to sleep all night due to SOB or diaphoresis, nose bleed  While in the ED, workup showed BNP and troponin wnl. WBC 13.9, RBC 4.01, Hgb 11.5. Glucose was wnl. CXR showed worsened vascular and interstitial changes and worsened lingular alveolar opacity. Hospitalist was asked to admit for further management.     Hospital Course:     1.Hemoptysis with suspicious for left-sided lung mass on CT chest with atypical chest pain. Seen by pulmonary, patient has history of smoking and COPD, counseled to quit, Patient was seen by pulmonary and they will schedule an outpatient appointment for him for fiberoptic bronchoscopy which will be scheduled by pulmonary. Continue supportive care. Discussed his case with Dr. Lamonte Sakai yesterday.  2. Possible postobstructive pneumonia. Was on On Levaquin and will add Flagyl. Stable. We'll place on Augmentin for 5  more days and then follow with PCP.   3. History of NSAID use, history of dark stools. Could be aspirated blood from hemoptysis however mild chronic upper GI bleed cannot be ruled out, continue PPI, avoid NSAIDs, GI was consulted who would follow in the outpatient setting.  4. Essential hypertension. On combination of Norvasc, ACE, atenolol and low-dose diuretic PRN continue.  5. Hx of smoking. Counseled to quit.  6. GERD & Dysphagia. On PPI. GI - Speech eval, outpatient barium swallow/EGD per GI. Cleared by speech therapy for regular diet, note dysphagia is a chronic problem going on for years. Discussed his case with GI physician on-call Dr. Loletha Carrow today.  7. Dyslipidemia. Continue statin and Lopid.  8. COPD. Minimal to no wheezing. Continue supportive care, taper of steroids rapidly.   9. Recent clavicle injury along with chronic pain syndrome. Supportive care. Minimize narcotics.   10. Obstructive sleep apnea and obesity. Outpatient PCP follow-up, CPAP at night.   11. DM type II. Continue home regimen unchanged. Follow with PCP for glycemic control.      Discharge Condition: Stable  Follow UP  Follow-up Information    Follow up with Fransisca Kaufmann Dettinger, MD. Schedule an appointment as soon as possible for a visit in 1 week.   Specialties:  Family Medicine, Cardiology   Contact information:   [address] [place] [address] [phone]       Follow up with Collene Gobble., MD. Schedule an appointment as soon as possible for a visit in 1 week.   Specialty:  Pulmonary Disease   Contact information:   [address] [address] [phone]       Follow up with Nelida Meuse III, MD. Schedule an appointment as soon as possible for a visit in 1 week.   Specialty:  Gastroenterology   Contact information:   [address] [place] [address] [phone]        Consults obtained - Pulmonary, GI  Diet and Activity recommendation: See Discharge  Instructions below  Discharge Instructions       Discharge Instructions    Discharge instructions    Complete by:  As directed   Follow with Primary MD Fransisca Kaufmann Dettinger, MD in 7 days   Get CBC, CMP, 2 view Chest X ray checked  by Primary MD next visit.    Activity: As tolerated with Full fall precautions use walker/cane & assistance  as needed   Disposition Home     Diet:   Heart Healthy Low Carb  with feeding assistance and aspiration precautions.  For Heart failure patients - Check your Weight same time everyday, if you gain over 2 pounds, or you develop in leg swelling, experience more shortness of breath or chest pain, call your Primary MD immediately. Follow Cardiac Low Salt Diet and 1.5 lit/day fluid restriction.   On your next visit with your primary care physician please Get Medicines reviewed and adjusted.   Please request your Prim.MD to go over all Hospital Tests and Procedure/Radiological results at the follow up, please get all Hospital records sent to your Prim MD by signing hospital release before you go home.   If you experience worsening of your admission symptoms, develop shortness of breath, life threatening emergency, suicidal or homicidal thoughts you must seek medical attention immediately by calling 911 or calling your MD immediately  if symptoms less severe.  You Must read complete instructions/literature along with all the possible adverse reactions/side effects for all the Medicines you take and that have been prescribed to you. Take any new Medicines after you have completely understood and accpet all the possible adverse reactions/side effects.   Do not drive, operating heavy machinery, perform activities at heights, swimming or participation in water activities or provide baby sitting services if your were admitted for syncope or siezures until you have seen by Primary MD or a Neurologist and advised to do so again.  Do not drive when taking Pain  medications.    Do not take more than prescribed Pain, Sleep and Anxiety Medications  Special Instructions: If you have smoked or chewed Tobacco  in the last 2 yrs please stop smoking, stop any regular Alcohol  and or any Recreational drug use.  Wear Seat belts while driving.   Please note  You were cared for by a hospitalist during your hospital stay. If you have any questions about your discharge medications or the care you received while you were in the hospital after you are discharged, you can call the unit and asked to speak with the hospitalist on call if the hospitalist that took care of you is not available. Once you are discharged, your primary care physician will handle any further medical issues. Please note that NO REFILLS for any discharge medications will be authorized once you are discharged, as it is imperative that you return to your primary care physician (or establish a relationship with a primary care physician if you do not have one) for your aftercare needs so that they can reassess your need for medications and monitor your lab values.     Increase activity slowly    Complete by:  As directed              Discharge Medications       Medication List    STOP taking these medications        doxycycline 100 MG tablet  Commonly known as:  VIBRA-TABS      TAKE these medications        albuterol 108 (90 Base) MCG/ACT inhaler  Commonly known as:  PROVENTIL HFA;VENTOLIN HFA  Inhale 2 puffs into the lungs every 4 (four) hours as needed for wheezing or shortness of breath.     amLODipine 10 MG tablet  Commonly known as:  NORVASC  Take 10 mg by mouth daily.     amoxicillin-clavulanate 875-125 MG tablet  Commonly known as:  AUGMENTIN  Take 1 tablet by mouth 2 (two) times daily.     atenolol 100 MG tablet  Commonly known as:  TENORMIN  Take 1 tablet (100 mg total) by mouth daily.     atorvastatin 20 MG tablet  Commonly known as:  LIPITOR  Take 1 tablet  (20 mg total) by mouth daily.     buPROPion 150 MG 24 hr tablet  Commonly known as:  WELLBUTRIN XL  TAKE 1 TABLET BY MOUTH DAILY     Doxepin HCl 5 % Crea  Apply 1 application topically daily as needed (pain).     fluticasone 50 MCG/ACT nasal spray  Commonly known as:  FLONASE  Place 1 spray into both nostrils 2 (two) times daily as needed for allergies or rhinitis.     fluticasone furoate-vilanterol 100-25 MCG/INH Aepb  Commonly known as:  BREO ELLIPTA  Inhale 1 puff into the lungs daily.     gabapentin 600 MG tablet  Commonly known as:  NEURONTIN  TAKE TWO (2) TABLETS THREE (3) TIMES DAILY     gemfibrozil 600 MG tablet  Commonly known as:  LOPID  Take 600 mg by mouth 2 (two) times daily before a meal.     Insulin Degludec 200 UNIT/ML Sopn  Commonly known as:  TRESIBA FLEXTOUCH  Inject 95 Units into the skin daily.     JANUVIA 100 MG tablet  Generic drug:  sitaGLIPtin  TAKE 1 TABLET BY MOUTH DAILY     lisinopril-hydrochlorothiazide 20-12.5 MG tablet  Commonly known as:  PRINZIDE,ZESTORETIC  TAKE 2 TABLETS BY MOUTH DAILY.     metFORMIN 1000 MG tablet  Commonly known as:  GLUCOPHAGE  Take 1,000 mg by mouth 2 (two) times daily with a meal.     NOVOLOG FLEXPEN 100 UNIT/ML FlexPen  Generic drug:  insulin aspart  INJECT UP TO 40 UNITS UP TO FOUR TIMES A DAY     omeprazole 40 MG capsule  Commonly known as:  PRILOSEC  Take 40 mg by mouth daily.     oxyCODONE-acetaminophen 5-325 MG tablet  Commonly known as:  PERCOCET/ROXICET  Take 1 tablet by mouth every 6 (six) hours as needed.     OXYCONTIN 30 MG 12 hr tablet  Generic drug:  oxyCODONE  Take 30 mg by mouth 2 (two) times daily.     Pen Needles 31G X 6 MM Misc  1 each by Does not apply route 4 (four) times daily. Use with insulin pens to inject insulin up to 5 times daily     predniSONE 20 MG tablet  Commonly known as:  DELTASONE  2 po at same time daily for 5 days     tiZANidine 4 MG tablet  Commonly known as:   ZANAFLEX  Take 4 mg by mouth 2 (two) times daily as needed for muscle spasms.     umeclidinium bromide 62.5 MCG/INH Aepb  Commonly known as:  INCRUSE ELLIPTA  Inhale 1 puff into the lungs daily.        Major procedures and Radiology Reports - PLEASE review detailed and final reports for all details, in brief -       Dg Chest 2 View  07/23/2015  CLINICAL DATA:  Cough and dyspnea for several weeks. Hemoptysis tonight. EXAM: CHEST  2 VIEW COMPARISON:  06/16/2015 FINDINGS: There is mild hyperinflation. There is moderate vascular and interstitial prominence which has worsened. There is increased interstitial fluid or thickening in the basilar periphery. There is worsened alveolar opacity in the  lingula. These findings may represent congestive heart failure with asymmetric alveolar edema. There is no pleural effusion. IMPRESSION: Worsened vascular and interstitial changes. Worsened lingular alveolar opacity. This may represent congestive failure but an infectious infiltrate is not entirely excluded. Electronically Signed   By: Andreas Newport M.D.   On: 07/23/2015 21:21   Dg Clavicle Right  07/09/2015  Clinical:  History of clavicle fracture several weeks old. X-rays were taken of the right clavicle, two views: There is a comminuted midshaft clavicle fracture on the right with two small butterfly fragments present.  There is bayonet positioning with the proximal portion more superior.  Little call us is seen.   07/09/2015   Comminuted midshaft fracture of the right clavicle.  Ct Angio Chest Pe W/cm &/or Wo Cm  07/24/2015  CLINICAL DATA:  Hemoptysis. Sudden onset of chest pain yesterday afternoon. Pain radiates to the back. EXAM: CT ANGIOGRAPHY CHEST WITH CONTRAST TECHNIQUE: Multidetector CT imaging of the chest was performed using the standard protocol during bolus administration of intravenous contrast. Multiplanar CT image reconstructions and MIPs were obtained to evaluate the vascular anatomy.  CONTRAST:  100 mL Isovue 370 IV COMPARISON:  Chest radiographs 5 hours prior.  Chest CT 12/20/2010 FINDINGS: Mediastinum/Lymph Nodes: Extensive multifocal mediastinal adenopathy. Enlarged lymph nodes in the right upper paratracheal, highest mediastinum, right lower paratracheal, AP window, and subcarinal stations. Subcarinal soft tissue density is unable to be separated from the esophagus, however measures at least 5.6 cm transverse dimension. Extensive left hilar soft tissue density, contiguous with multi focal nodular soft tissue density in the left lower lobe. This soft tissue density leads to narrowing of the left lower lobe pulmonary arteries without definite invasion. No discrete filling defects within the pulmonary arteries. Some degree of occlusion of the left lower lobe pulmonary vein. Thoracic aorta is normal in caliber. No pericardial effusion. There is a prominent right hilar lymph node. Heart is upper limits of normal in size, there are coronary artery calcifications. Lungs/Pleura: Multifocal innumerable left lower lobe nodules, confluent in the infrahilar region, with a masslike confluence measuring 3.2 x 3.2 cm. More superiorly this confluence measures 4.1 cm 4.1 x 3.5 cm and includes a segmental branch of the left lower lobe pulmonary artery. Associated diffuse ground-glass and ill-defined opacity in the left lower lobe. Paramediastinal nodule in the lingula measures 1.5 cm. There is fissural thickening of the interlobar fissure. Diffuse septal thickening in the left lower lobe. Bubbly dependent debris in the left mainstem bronchus extending into the lower lobe with bronchial thickening and luminal narrowing. No definite nodule in the right lung. Moderate emphysema. Trace left pleural effusion. No right pleural effusion. Upper abdomen: Portions of both adrenal glands are normal, however not entirely excluded. No evidence of focal lesion in the liver allowing for phase of contrast. No acute  abnormality in the included upper abdomen. Musculoskeletal: Right mid clavicle fracture with minimal surrounding callus formation. No blastic or destructive lytic lesions. Review of the MIP images confirms the above findings. IMPRESSION: 1. Extensive masslike nodular opacities in the left lower lobe, with a confluent infrahilar mass measuring 3.2 x 3.2 cm, many of which are contiguous with left hilar soft tissue density. There is extensive multifocal mediastinal adenopathy. 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 These masslike opacities encase the left lower lobe pulmonary artery leads luminal narrowing but no discrete invasion or frank pulmonary embolus. Multiple ground-glass opacities in the left lower lobe, may be pneumonitis, postobstructive atelectasis  or spread of malignancy. 2. Additional lingular nodule measures 1.5 cm. 3. Bubbly density in the left mainstem bronchus and left lower lobe, with left lung bronchial thickening. This is likely secretions or aspiration. 4. Moderate emphysema. Electronically Signed   By: Jeb Levering M.D.   On: 07/24/2015 02:17    Micro Results      Recent Results (from the past 240 hour(s))  MRSA PCR Screening     Status: None   Collection Time: 07/24/15  2:55 AM  Result Value Ref Range Status   MRSA by PCR NEGATIVE NEGATIVE Final    Comment:        The GeneXpert MRSA Assay (FDA approved for NASAL specimens only), is one component of a comprehensive MRSA colonization surveillance program. It is not intended to diagnose MRSA infection nor to guide or monitor treatment for MRSA infections.        Today   Subjective    [name] today has no headache,no chest abdominal pain,no new weakness tingling or numbness, feels much better wants to go home today.     Objective   Blood pressure 112/50, pulse 71, temperature 98 F (36.7 C), temperature source Oral, resp. rate 20, height '5\' 11"'$   (1.803 m), weight 95.618 kg (210 lb 12.8 oz), SpO2 96 %.   Intake/Output Summary (Last 24 hours) at 07/27/15 1149 Last data filed at 07/27/15 0958  Gross per 24 hour  Intake    780 ml  Output      0 ml  Net    780 ml    Exam Awake Alert, Oriented x 3, No new F.N deficits, Normal affect [place].AT,PERRAL Supple Neck,No JVD, No cervical lymphadenopathy appriciated.  Symmetrical Chest wall movement, Good air movement bilaterally, CTAB RRR,No Gallops,Rubs or new Murmurs, No Parasternal Heave +ve B.Sounds, Abd Soft, Non tender, No organomegaly appriciated, No rebound -guarding or rigidity. No Cyanosis, Clubbing or edema, No new Rash or bruise   Data Review   CBC w Diff: Lab Results  Component Value Date   WBC 13.2* 07/25/2015   HGB 11.5* 07/25/2015   HCT 35.1* 07/25/2015   PLT 238 07/25/2015   LYMPHOPCT 15 07/23/2015   MONOPCT 12 07/23/2015   EOSPCT 1 07/23/2015   BASOPCT 0 07/23/2015    CMP: Lab Results  Component Value Date   NA 135 07/25/2015   K 4.0 07/25/2015   CL 97* 07/25/2015   CO2 24 07/25/2015   BUN 44* 07/25/2015   CREATININE 1.08 07/25/2015   PROT 8.0 02/14/2014   ALBUMIN 4.3 02/14/2014   BILITOT 0.3 02/14/2014   ALKPHOS 98 02/14/2014   AST 30 02/14/2014   ALT 28 02/14/2014  .   Total Time in preparing paper work, data evaluation and todays exam - 35 minutes  Thurnell Lose M.D on 07/27/2015 at 11:49 AM  [facility]   Office  [phone]

## 2015-07-27 NOTE — Progress Notes (Addendum)
Patient was discharged home by MD order; discharged instructions review and give to patient and his wife with care notes and prescription; IV DIC; skin intact; patient will be escorted to the car by nurse.

## 2015-07-28 ENCOUNTER — Emergency Department (HOSPITAL_COMMUNITY): Payer: Medicare Other

## 2015-07-28 ENCOUNTER — Inpatient Hospital Stay (HOSPITAL_COMMUNITY)
Admission: EM | Admit: 2015-07-28 | Discharge: 2015-08-13 | DRG: 180 | Disposition: A | Discharge status: Hospice | Payer: Medicare Other | Attending: Internal Medicine | Admitting: Internal Medicine

## 2015-07-28 ENCOUNTER — Encounter (HOSPITAL_COMMUNITY): Payer: Self-pay

## 2015-07-28 DIAGNOSIS — I5033 Acute on chronic diastolic (congestive) heart failure: Secondary | ICD-10-CM | POA: Diagnosis present

## 2015-07-28 DIAGNOSIS — E119 Type 2 diabetes mellitus without complications: Secondary | ICD-10-CM

## 2015-07-28 DIAGNOSIS — C3432 Malignant neoplasm of lower lobe, left bronchus or lung: Principal | ICD-10-CM | POA: Diagnosis present

## 2015-07-28 DIAGNOSIS — M549 Dorsalgia, unspecified: Secondary | ICD-10-CM | POA: Diagnosis present

## 2015-07-28 DIAGNOSIS — Z66 Do not resuscitate: Secondary | ICD-10-CM | POA: Diagnosis present

## 2015-07-28 DIAGNOSIS — D638 Anemia in other chronic diseases classified elsewhere: Secondary | ICD-10-CM | POA: Diagnosis present

## 2015-07-28 DIAGNOSIS — D509 Iron deficiency anemia, unspecified: Secondary | ICD-10-CM | POA: Diagnosis present

## 2015-07-28 DIAGNOSIS — Z8249 Family history of ischemic heart disease and other diseases of the circulatory system: Secondary | ICD-10-CM

## 2015-07-28 DIAGNOSIS — J9601 Acute respiratory failure with hypoxia: Secondary | ICD-10-CM | POA: Diagnosis present

## 2015-07-28 DIAGNOSIS — N179 Acute kidney failure, unspecified: Secondary | ICD-10-CM | POA: Diagnosis present

## 2015-07-28 DIAGNOSIS — G9340 Encephalopathy, unspecified: Secondary | ICD-10-CM | POA: Insufficient documentation

## 2015-07-28 DIAGNOSIS — I2510 Atherosclerotic heart disease of native coronary artery without angina pectoris: Secondary | ICD-10-CM | POA: Diagnosis present

## 2015-07-28 DIAGNOSIS — Z79899 Other long term (current) drug therapy: Secondary | ICD-10-CM

## 2015-07-28 DIAGNOSIS — E876 Hypokalemia: Secondary | ICD-10-CM | POA: Diagnosis present

## 2015-07-28 DIAGNOSIS — I639 Cerebral infarction, unspecified: Secondary | ICD-10-CM

## 2015-07-28 DIAGNOSIS — I10 Essential (primary) hypertension: Secondary | ICD-10-CM | POA: Diagnosis present

## 2015-07-28 DIAGNOSIS — K219 Gastro-esophageal reflux disease without esophagitis: Secondary | ICD-10-CM | POA: Diagnosis present

## 2015-07-28 DIAGNOSIS — I739 Peripheral vascular disease, unspecified: Secondary | ICD-10-CM | POA: Diagnosis present

## 2015-07-28 DIAGNOSIS — E782 Mixed hyperlipidemia: Secondary | ICD-10-CM | POA: Diagnosis present

## 2015-07-28 DIAGNOSIS — R042 Hemoptysis: Secondary | ICD-10-CM | POA: Diagnosis present

## 2015-07-28 DIAGNOSIS — Z720 Tobacco use: Secondary | ICD-10-CM | POA: Diagnosis present

## 2015-07-28 DIAGNOSIS — R338 Other retention of urine: Secondary | ICD-10-CM | POA: Diagnosis not present

## 2015-07-28 DIAGNOSIS — F4323 Adjustment disorder with mixed anxiety and depressed mood: Secondary | ICD-10-CM | POA: Insufficient documentation

## 2015-07-28 DIAGNOSIS — R0902 Hypoxemia: Secondary | ICD-10-CM

## 2015-07-28 DIAGNOSIS — I110 Hypertensive heart disease with heart failure: Secondary | ICD-10-CM | POA: Diagnosis present

## 2015-07-28 DIAGNOSIS — Z79891 Long term (current) use of opiate analgesic: Secondary | ICD-10-CM

## 2015-07-28 DIAGNOSIS — R29898 Other symptoms and signs involving the musculoskeletal system: Secondary | ICD-10-CM | POA: Diagnosis not present

## 2015-07-28 DIAGNOSIS — I4891 Unspecified atrial fibrillation: Secondary | ICD-10-CM | POA: Insufficient documentation

## 2015-07-28 DIAGNOSIS — R0602 Shortness of breath: Secondary | ICD-10-CM

## 2015-07-28 DIAGNOSIS — Z809 Family history of malignant neoplasm, unspecified: Secondary | ICD-10-CM

## 2015-07-28 DIAGNOSIS — J9621 Acute and chronic respiratory failure with hypoxia: Secondary | ICD-10-CM | POA: Diagnosis present

## 2015-07-28 DIAGNOSIS — J440 Chronic obstructive pulmonary disease with acute lower respiratory infection: Secondary | ICD-10-CM | POA: Diagnosis present

## 2015-07-28 DIAGNOSIS — R05 Cough: Secondary | ICD-10-CM | POA: Diagnosis not present

## 2015-07-28 DIAGNOSIS — J189 Pneumonia, unspecified organism: Secondary | ICD-10-CM | POA: Diagnosis present

## 2015-07-28 DIAGNOSIS — Z8614 Personal history of Methicillin resistant Staphylococcus aureus infection: Secondary | ICD-10-CM

## 2015-07-28 DIAGNOSIS — Z794 Long term (current) use of insulin: Secondary | ICD-10-CM

## 2015-07-28 DIAGNOSIS — R0600 Dyspnea, unspecified: Secondary | ICD-10-CM

## 2015-07-28 DIAGNOSIS — G4733 Obstructive sleep apnea (adult) (pediatric): Secondary | ICD-10-CM | POA: Diagnosis present

## 2015-07-28 DIAGNOSIS — R079 Chest pain, unspecified: Secondary | ICD-10-CM

## 2015-07-28 DIAGNOSIS — Z7189 Other specified counseling: Secondary | ICD-10-CM | POA: Insufficient documentation

## 2015-07-28 DIAGNOSIS — G8929 Other chronic pain: Secondary | ICD-10-CM | POA: Diagnosis present

## 2015-07-28 DIAGNOSIS — I480 Paroxysmal atrial fibrillation: Secondary | ICD-10-CM | POA: Diagnosis present

## 2015-07-28 DIAGNOSIS — F17210 Nicotine dependence, cigarettes, uncomplicated: Secondary | ICD-10-CM | POA: Diagnosis present

## 2015-07-28 DIAGNOSIS — R339 Retention of urine, unspecified: Secondary | ICD-10-CM | POA: Diagnosis present

## 2015-07-28 DIAGNOSIS — Z515 Encounter for palliative care: Secondary | ICD-10-CM | POA: Insufficient documentation

## 2015-07-28 DIAGNOSIS — Z85828 Personal history of other malignant neoplasm of skin: Secondary | ICD-10-CM

## 2015-07-28 DIAGNOSIS — R918 Other nonspecific abnormal finding of lung field: Secondary | ICD-10-CM

## 2015-07-28 DIAGNOSIS — R071 Chest pain on breathing: Secondary | ICD-10-CM | POA: Insufficient documentation

## 2015-07-28 DIAGNOSIS — F17200 Nicotine dependence, unspecified, uncomplicated: Secondary | ICD-10-CM

## 2015-07-28 DIAGNOSIS — R2990 Unspecified symptoms and signs involving the nervous system: Secondary | ICD-10-CM | POA: Diagnosis not present

## 2015-07-28 HISTORY — DX: Malignant neoplasm of unspecified part of unspecified bronchus or lung: C34.90

## 2015-07-28 LAB — CBC WITH DIFFERENTIAL/PLATELET
Basophils Absolute: 0 10*3/uL (ref 0.0–0.1)
Basophils Relative: 0 %
EOS ABS: 0 10*3/uL (ref 0.0–0.7)
Eosinophils Relative: 0 %
HEMATOCRIT: 32.6 % — AB (ref 39.0–52.0)
HEMOGLOBIN: 10.5 g/dL — AB (ref 13.0–17.0)
LYMPHS ABS: 2.6 10*3/uL (ref 0.7–4.0)
LYMPHS PCT: 14 %
MCH: 27.4 pg (ref 26.0–34.0)
MCHC: 32.2 g/dL (ref 30.0–36.0)
MCV: 85.1 fL (ref 78.0–100.0)
MONOS PCT: 8 %
Monocytes Absolute: 1.4 10*3/uL — ABNORMAL HIGH (ref 0.1–1.0)
NEUTROS PCT: 78 %
Neutro Abs: 13.9 10*3/uL — ABNORMAL HIGH (ref 1.7–7.7)
Platelets: 177 10*3/uL (ref 150–400)
RBC: 3.83 MIL/uL — ABNORMAL LOW (ref 4.22–5.81)
RDW: 14.6 % (ref 11.5–15.5)
WBC: 17.9 10*3/uL — ABNORMAL HIGH (ref 4.0–10.5)

## 2015-07-28 LAB — COMPREHENSIVE METABOLIC PANEL
ALK PHOS: 68 U/L (ref 38–126)
ALT: 21 U/L (ref 17–63)
ANION GAP: 16 — AB (ref 5–15)
AST: 37 U/L (ref 15–41)
Albumin: 3.3 g/dL — ABNORMAL LOW (ref 3.5–5.0)
BILIRUBIN TOTAL: 0.7 mg/dL (ref 0.3–1.2)
BUN: 40 mg/dL — ABNORMAL HIGH (ref 6–20)
CALCIUM: 9.1 mg/dL (ref 8.9–10.3)
CO2: 23 mmol/L (ref 22–32)
CREATININE: 2.48 mg/dL — AB (ref 0.61–1.24)
Chloride: 94 mmol/L — ABNORMAL LOW (ref 101–111)
GFR, EST AFRICAN AMERICAN: 30 mL/min — AB (ref >60)
GFR, EST NON AFRICAN AMERICAN: 26 mL/min — AB (ref >60)
Glucose, Bld: 74 mg/dL (ref 65–99)
Potassium: 4.4 mmol/L (ref 3.5–5.1)
SODIUM: 133 mmol/L — AB (ref 135–145)
TOTAL PROTEIN: 7 g/dL (ref 6.5–8.1)

## 2015-07-28 LAB — TROPONIN I

## 2015-07-28 LAB — LIPASE, BLOOD: LIPASE: 18 U/L (ref 11–51)

## 2015-07-28 MED ORDER — SODIUM CHLORIDE 0.9 % IV BOLUS (SEPSIS)
1000.0000 mL | Freq: Once | INTRAVENOUS | Status: AC
Start: 2015-07-28 — End: 2015-07-29
  Administered 2015-07-29: 1000 mL via INTRAVENOUS

## 2015-07-28 MED ORDER — HYDROMORPHONE HCL 1 MG/ML IJ SOLN
1.0000 mg | Freq: Once | INTRAMUSCULAR | Status: AC
  Administered 2015-07-29: 1 mg via INTRAVENOUS
  Filled 2015-07-28: qty 1

## 2015-07-28 MED ORDER — SODIUM CHLORIDE 0.9 % IV BOLUS (SEPSIS)
1000.0000 mL | Freq: Once | INTRAVENOUS | Status: AC
  Administered 2015-07-28: 1000 mL via INTRAVENOUS

## 2015-07-28 MED ORDER — FENTANYL CITRATE (PF) 100 MCG/2ML IJ SOLN
100.0000 ug | Freq: Once | INTRAMUSCULAR | Status: AC
  Administered 2015-07-28: 100 ug via INTRAVENOUS
  Filled 2015-07-28: qty 2

## 2015-07-28 NOTE — ED Notes (Addendum)
Onset today chest pain, shortness of breath, low BP, O2 sats at home 79-80%.  Pt not on oxygen.  Pt vomited large (size of his fist) purplish phlegm.  Pt is pale, labored breathing.  Dx with lung CA 07-24-15.

## 2015-07-28 NOTE — ED Notes (Signed)
After IV placement, was prepping pt to go for xray. Pt's O2 sats dropped into low 80s. Placed pt on [place] at 2L and they did not improve. Increased to 3L and sats went to high 90s. Pt began trying to sit up, stating he could not breath. Upon listening, was unable to hear air movement, but did hear tight expiratory wheezing. Went to get provider and Gertie Fey, [place] came to bedside. Verbal order for nonrebreather given. Placed pt on NRB and asked [place], MD if we could do portable chest and he agreed.

## 2015-07-28 NOTE — ED Provider Notes (Signed)
CSN: [id number]     Arrival date & time 07/28/15  2047 History   First MD Initiated Contact with Patient 07/28/15 2121     Chief Complaint  Patient presents with  . Shortness of Breath  . Chest Pain     (Consider location/radiation/quality/duration/timing/severity/associated sxs/prior Treatment) Patient is a 65 y.o. male presenting with chest pain.  Chest Pain Pain location:  L chest and L lateral chest Pain quality: aching and sharp   Pain radiates to:  Does not radiate Pain radiates to the back: no   Pain severity:  Mild Onset quality:  Gradual Duration:  2 weeks Timing:  Constant Progression:  Worsening Context: breathing and movement   Associated symptoms: cough and shortness of breath   Associated symptoms: no abdominal pain, no anxiety and no palpitations   Risk factors: smoking     Past Medical History  Diagnosis Date  . Essential hypertension, benign   . Type 2 diabetes mellitus ([place])   . Mixed hyperlipidemia   . OSA (obstructive sleep apnea)   . Partial small bowel obstruction ([place])   . 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 Coronary atherosclerosis of native coronary artery     Nonobstructive  . Erythrocytosis   . Peripheral arterial disease (HCC)     70% right iliac  . History of MRSA infection   . Neuropathy ([place]) 2013  . Cancer ([place]) 2015    skin  . Cancer of lung ([place]) 07-24-15   Past Surgical History  Procedure Laterality Date  . Appendectomy    . Neck surgery    . Back surgery    . Hemorrhoid surgery    . Vasectomy     Family History  Problem Relation Age of Onset  . Coronary artery disease    . Hypertension    . Cancer Mother   . Depression Mother   . Diabetes Mother   . Heart disease Maternal Grandfather   . Hyperlipidemia Maternal Grandfather   . Hypertension Maternal Grandfather    Social History  Substance Use Topics  . Smoking status: Current Every Day Smoker -- 1.00 packs/day for 40 years    Types: Cigarettes  .  Smokeless tobacco: Never Used  . Alcohol Use: No    Review of Systems  Respiratory: Positive for cough and shortness of breath.   Cardiovascular: Positive for chest pain. Negative for palpitations and leg swelling.  Gastrointestinal: Negative for abdominal pain.  Skin: Negative for pallor and wound.  All other systems reviewed and are negative.     Allergies  Nsaids; Prednisone; and Vancomycin  Home Medications   Prior to Admission medications   Medication Sig Start Date End Date Taking? Authorizing Provider  albuterol (PROVENTIL HFA;VENTOLIN HFA) 108 (90 Base) MCG/ACT inhaler Inhale 2 puffs into the lungs every 4 (four) hours as needed for wheezing or shortness of breath. 07/18/15  Yes Fransisca Kaufmann Dettinger, MD  amLODipine (NORVASC) 10 MG tablet Take 10 mg by mouth daily.   Yes Historical Provider, MD  amoxicillin-clavulanate (AUGMENTIN) 875-125 MG tablet Take 1 tablet by mouth 2 (two) times daily. 07/27/15  Yes Thurnell Lose, MD  atenolol (TENORMIN) 100 MG tablet Take 1 tablet (100 mg total) by mouth daily. 04/04/15  Yes Fransisca Kaufmann Dettinger, MD  atorvastatin (LIPITOR) 20 MG tablet Take 1 tablet (20 mg total) by mouth daily. 04/04/15  Yes Fransisca Kaufmann Dettinger, MD  buPROPion (WELLBUTRIN XL) 150 MG 24 hr tablet TAKE 1 TABLET BY MOUTH DAILY  07/23/15  Yes Fransisca Kaufmann Dettinger, MD  fluticasone furoate-vilanterol (BREO ELLIPTA) 100-25 MCG/INH AEPB Inhale 1 puff into the lungs daily. 07/18/15  Yes Fransisca Kaufmann Dettinger, MD  gabapentin (NEURONTIN) 600 MG tablet TAKE TWO (2) TABLETS THREE (3) TIMES DAILY 04/29/15  Yes Fransisca Kaufmann Dettinger, MD  gemfibrozil (LOPID) 600 MG tablet Take 600 mg by mouth 2 (two) times daily before a meal.    Yes Historical Provider, MD  Insulin Degludec (TRESIBA FLEXTOUCH) 200 UNIT/ML SOPN Inject 95 Units into the skin daily. 05/08/15  Yes Fransisca Kaufmann Dettinger, MD  JANUVIA 100 MG tablet TAKE 1 TABLET BY MOUTH DAILY 07/23/15  Yes Fransisca Kaufmann Dettinger, MD  lisinopril-hydrochlorothiazide  (PRINZIDE,ZESTORETIC) 20-12.5 MG tablet TAKE 2 TABLETS BY MOUTH DAILY. Patient taking differently: TAKE 1 TABLETS BY MOUTH 2 TIMES DAILY. 07/23/15  Yes Fransisca Kaufmann Dettinger, MD  metFORMIN (GLUCOPHAGE) 1000 MG tablet Take 1,000 mg by mouth 2 (two) times daily with a meal.  11/14/13  Yes Historical Provider, MD  NOVOLOG FLEXPEN 100 UNIT/ML FlexPen INJECT UP TO 40 UNITS UP TO FOUR TIMES A DAY 06/24/15  Yes Fransisca Kaufmann Dettinger, MD  omeprazole (PRILOSEC) 40 MG capsule Take 40 mg by mouth daily.    Yes Historical Provider, MD  oxyCODONE (OXYCONTIN) 30 MG 12 hr tablet Take 30 mg by mouth 3 (three) times daily.  01/07/15  Yes Historical Provider, MD  oxyCODONE-acetaminophen (PERCOCET/ROXICET) 5-325 MG tablet Take 1 tablet by mouth every 6 (six) hours as needed. Patient taking differently: Take 1 tablet by mouth every 6 (six) hours as needed for moderate pain.  06/16/15  Yes Lily Kocher, PA-C  tiZANidine (ZANAFLEX) 4 MG tablet Take 4 mg by mouth daily.    Yes Historical Provider, MD  umeclidinium bromide (INCRUSE ELLIPTA) 62.5 MCG/INH AEPB Inhale 1 puff into the lungs daily. 07/18/15  Yes Fransisca Kaufmann Dettinger, MD  fluticasone (FLONASE) 50 MCG/ACT nasal spray Place 1 spray into both nostrils 2 (two) times daily as needed for allergies or rhinitis. 05/15/15   Fransisca Kaufmann Dettinger, MD  Insulin Pen Needle (PEN NEEDLES) 31G X 6 MM MISC 1 each by Does not apply route 4 (four) times daily. Use with insulin pens to inject insulin up to 5 times daily 03/20/15   Tammy Eckard, PHARMD  predniSONE (DELTASONE) 20 MG tablet 2 po at same time daily for 5 days Patient taking differently: Take 40 mg by mouth daily. 2 po at same time daily for 5 days 07/18/15   Fransisca Kaufmann Dettinger, MD   BP 102/61 mmHg  Pulse 71  Temp(Src) 97.7 F (36.5 C) (Oral)  Resp 17  Ht '5\' 11"'$  (1.803 m)  Wt 201 lb 8 oz (91.4 kg)  BMI 28.12 kg/m2  SpO2 91% Physical Exam  Constitutional: He is oriented to person, place, and time. He appears well-developed and  well-nourished.  HENT:  Head: Normocephalic and atraumatic.  Neck: Normal range of motion.  Cardiovascular: Normal rate and regular rhythm.   Pulmonary/Chest: Effort normal. No respiratory distress. He has wheezes.  Abdominal: Soft. He exhibits no distension. There is no tenderness.  Musculoskeletal: Normal range of motion. He exhibits no edema or tenderness.  Neurological: He is alert and oriented to person, place, and time. No cranial nerve deficit. Coordination normal.  Skin: Skin is warm and dry. No rash noted. No erythema. There is pallor.  Nursing note and vitals reviewed.   ED Course  Procedures (including critical care time) Labs Review Labs Reviewed  CBC WITH DIFFERENTIAL/PLATELET - Abnormal;  Notable for the following:    WBC 17.9 (*)    RBC 3.83 (*)    Hemoglobin 10.5 (*)    HCT 32.6 (*)    Neutro Abs 13.9 (*)    Monocytes Absolute 1.4 (*)    All other components within normal limits  COMPREHENSIVE METABOLIC PANEL - Abnormal; Notable for the following:    Sodium 133 (*)    Chloride 94 (*)    BUN 40 (*)    Creatinine, Ser 2.48 (*)    Albumin 3.3 (*)    GFR calc non Af Amer 26 (*)    GFR calc Af Amer 30 (*)    Anion gap 16 (*)    All other components within normal limits  LIPASE, BLOOD  TROPONIN I  LACTIC ACID, PLASMA  TYPE AND SCREEN  ABO/RH    Imaging Review Dg Chest Port 1 View  07/28/2015  CLINICAL DATA:  Chest pain and dyspnea, onset today.  Hypotension. EXAM: PORTABLE CHEST 1 VIEW COMPARISON:  07/23/2015 FINDINGS: There is worsened left lower lobe consolidation in this may represent postobstructive pneumonia. Hemorrhage, aspiration, neoplasm could also produce this appearance. There is hilar and mediastinal adenopathy. Right lung is clear. No large effusions. Normal pulmonary vasculature. Chronic right clavicle midshaft fracture deformity. IMPRESSION: Worsened left lower lobe consolidation. Electronically Signed   By: Andreas Newport M.D.   On: 07/28/2015  22:51   I have personally reviewed and evaluated these images and lab results as part of my medical decision-making.   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 Also with worsening consolidation in LLL taht could be concering for hemmorhage. AKI as well on labs with soft pressures, fluids given, type/screen initiated. Also with diffuse ST elevations and repolarization abnormality with PR depressions concerning for possible pericarditis. Will hold on NSAIDs in setting of poor kidney function.  Admitted to stepdown 2/2 possible clinical course, and also for pulm reevaluation for hemoptysis to ensure no active bleeding and possibly get biopsy.      Merrily Pew, MD 07/29/15 [phone]

## 2015-07-29 ENCOUNTER — Inpatient Hospital Stay (HOSPITAL_COMMUNITY): Payer: Medicare Other

## 2015-07-29 DIAGNOSIS — E876 Hypokalemia: Secondary | ICD-10-CM | POA: Diagnosis not present

## 2015-07-29 DIAGNOSIS — F4323 Adjustment disorder with mixed anxiety and depressed mood: Secondary | ICD-10-CM | POA: Diagnosis present

## 2015-07-29 DIAGNOSIS — G9340 Encephalopathy, unspecified: Secondary | ICD-10-CM | POA: Diagnosis present

## 2015-07-29 DIAGNOSIS — Z85828 Personal history of other malignant neoplasm of skin: Secondary | ICD-10-CM | POA: Diagnosis not present

## 2015-07-29 DIAGNOSIS — J9601 Acute respiratory failure with hypoxia: Secondary | ICD-10-CM | POA: Diagnosis not present

## 2015-07-29 DIAGNOSIS — J690 Pneumonitis due to inhalation of food and vomit: Secondary | ICD-10-CM

## 2015-07-29 DIAGNOSIS — Z8614 Personal history of Methicillin resistant Staphylococcus aureus infection: Secondary | ICD-10-CM | POA: Diagnosis not present

## 2015-07-29 DIAGNOSIS — K148 Other diseases of tongue: Secondary | ICD-10-CM | POA: Diagnosis not present

## 2015-07-29 DIAGNOSIS — I639 Cerebral infarction, unspecified: Secondary | ICD-10-CM | POA: Diagnosis not present

## 2015-07-29 DIAGNOSIS — I4891 Unspecified atrial fibrillation: Secondary | ICD-10-CM | POA: Diagnosis not present

## 2015-07-29 DIAGNOSIS — M549 Dorsalgia, unspecified: Secondary | ICD-10-CM | POA: Diagnosis present

## 2015-07-29 DIAGNOSIS — J9621 Acute and chronic respiratory failure with hypoxia: Secondary | ICD-10-CM | POA: Diagnosis present

## 2015-07-29 DIAGNOSIS — G4733 Obstructive sleep apnea (adult) (pediatric): Secondary | ICD-10-CM | POA: Diagnosis present

## 2015-07-29 DIAGNOSIS — G8929 Other chronic pain: Secondary | ICD-10-CM | POA: Diagnosis present

## 2015-07-29 DIAGNOSIS — E119 Type 2 diabetes mellitus without complications: Secondary | ICD-10-CM | POA: Diagnosis present

## 2015-07-29 DIAGNOSIS — J189 Pneumonia, unspecified organism: Secondary | ICD-10-CM | POA: Diagnosis present

## 2015-07-29 DIAGNOSIS — R062 Wheezing: Secondary | ICD-10-CM

## 2015-07-29 DIAGNOSIS — I5033 Acute on chronic diastolic (congestive) heart failure: Secondary | ICD-10-CM | POA: Diagnosis present

## 2015-07-29 DIAGNOSIS — Z8249 Family history of ischemic heart disease and other diseases of the circulatory system: Secondary | ICD-10-CM | POA: Diagnosis not present

## 2015-07-29 DIAGNOSIS — R0602 Shortness of breath: Secondary | ICD-10-CM | POA: Diagnosis not present

## 2015-07-29 DIAGNOSIS — R29898 Other symptoms and signs involving the musculoskeletal system: Secondary | ICD-10-CM | POA: Diagnosis not present

## 2015-07-29 DIAGNOSIS — R042 Hemoptysis: Secondary | ICD-10-CM

## 2015-07-29 DIAGNOSIS — N179 Acute kidney failure, unspecified: Secondary | ICD-10-CM | POA: Diagnosis not present

## 2015-07-29 DIAGNOSIS — I509 Heart failure, unspecified: Secondary | ICD-10-CM | POA: Diagnosis not present

## 2015-07-29 DIAGNOSIS — K219 Gastro-esophageal reflux disease without esophagitis: Secondary | ICD-10-CM | POA: Diagnosis present

## 2015-07-29 DIAGNOSIS — C3432 Malignant neoplasm of lower lobe, left bronchus or lung: Secondary | ICD-10-CM | POA: Diagnosis present

## 2015-07-29 DIAGNOSIS — E782 Mixed hyperlipidemia: Secondary | ICD-10-CM | POA: Diagnosis present

## 2015-07-29 DIAGNOSIS — I2510 Atherosclerotic heart disease of native coronary artery without angina pectoris: Secondary | ICD-10-CM | POA: Diagnosis present

## 2015-07-29 DIAGNOSIS — D509 Iron deficiency anemia, unspecified: Secondary | ICD-10-CM | POA: Diagnosis present

## 2015-07-29 DIAGNOSIS — D638 Anemia in other chronic diseases classified elsewhere: Secondary | ICD-10-CM | POA: Diagnosis present

## 2015-07-29 DIAGNOSIS — I5031 Acute diastolic (congestive) heart failure: Secondary | ICD-10-CM | POA: Diagnosis not present

## 2015-07-29 DIAGNOSIS — Z66 Do not resuscitate: Secondary | ICD-10-CM | POA: Diagnosis present

## 2015-07-29 DIAGNOSIS — Z515 Encounter for palliative care: Secondary | ICD-10-CM | POA: Diagnosis not present

## 2015-07-29 DIAGNOSIS — R338 Other retention of urine: Secondary | ICD-10-CM | POA: Diagnosis not present

## 2015-07-29 DIAGNOSIS — R918 Other nonspecific abnormal finding of lung field: Secondary | ICD-10-CM

## 2015-07-29 DIAGNOSIS — R05 Cough: Secondary | ICD-10-CM | POA: Diagnosis present

## 2015-07-29 DIAGNOSIS — I739 Peripheral vascular disease, unspecified: Secondary | ICD-10-CM | POA: Diagnosis present

## 2015-07-29 DIAGNOSIS — J440 Chronic obstructive pulmonary disease with acute lower respiratory infection: Secondary | ICD-10-CM | POA: Diagnosis present

## 2015-07-29 DIAGNOSIS — I480 Paroxysmal atrial fibrillation: Secondary | ICD-10-CM | POA: Diagnosis present

## 2015-07-29 DIAGNOSIS — R599 Enlarged lymph nodes, unspecified: Secondary | ICD-10-CM | POA: Diagnosis not present

## 2015-07-29 DIAGNOSIS — R339 Retention of urine, unspecified: Secondary | ICD-10-CM | POA: Diagnosis present

## 2015-07-29 DIAGNOSIS — Z79891 Long term (current) use of opiate analgesic: Secondary | ICD-10-CM | POA: Diagnosis not present

## 2015-07-29 DIAGNOSIS — Z79899 Other long term (current) drug therapy: Secondary | ICD-10-CM | POA: Diagnosis not present

## 2015-07-29 DIAGNOSIS — F17200 Nicotine dependence, unspecified, uncomplicated: Secondary | ICD-10-CM | POA: Diagnosis not present

## 2015-07-29 DIAGNOSIS — Z809 Family history of malignant neoplasm, unspecified: Secondary | ICD-10-CM | POA: Diagnosis not present

## 2015-07-29 DIAGNOSIS — R071 Chest pain on breathing: Secondary | ICD-10-CM | POA: Diagnosis not present

## 2015-07-29 DIAGNOSIS — R0789 Other chest pain: Secondary | ICD-10-CM | POA: Diagnosis not present

## 2015-07-29 DIAGNOSIS — R0600 Dyspnea, unspecified: Secondary | ICD-10-CM | POA: Diagnosis not present

## 2015-07-29 DIAGNOSIS — R2990 Unspecified symptoms and signs involving the nervous system: Secondary | ICD-10-CM | POA: Diagnosis not present

## 2015-07-29 DIAGNOSIS — F17210 Nicotine dependence, cigarettes, uncomplicated: Secondary | ICD-10-CM | POA: Diagnosis present

## 2015-07-29 DIAGNOSIS — I110 Hypertensive heart disease with heart failure: Secondary | ICD-10-CM | POA: Diagnosis present

## 2015-07-29 LAB — BASIC METABOLIC PANEL
ANION GAP: 15 (ref 5–15)
BUN: 44 mg/dL — ABNORMAL HIGH (ref 6–20)
CHLORIDE: 97 mmol/L — AB (ref 101–111)
CO2: 22 mmol/L (ref 22–32)
Calcium: 8.5 mg/dL — ABNORMAL LOW (ref 8.9–10.3)
Creatinine, Ser: 2.26 mg/dL — ABNORMAL HIGH (ref 0.61–1.24)
GFR calc Af Amer: 34 mL/min — ABNORMAL LOW (ref >60)
GFR, EST NON AFRICAN AMERICAN: 29 mL/min — AB (ref >60)
GLUCOSE: 94 mg/dL (ref 65–99)
POTASSIUM: 4.5 mmol/L (ref 3.5–5.1)
Sodium: 134 mmol/L — ABNORMAL LOW (ref 135–145)

## 2015-07-29 LAB — CBC WITH DIFFERENTIAL/PLATELET
BASOS ABS: 0 10*3/uL (ref 0.0–0.1)
Basophils Relative: 0 %
EOS PCT: 0 %
Eosinophils Absolute: 0 10*3/uL (ref 0.0–0.7)
HCT: 31.3 % — ABNORMAL LOW (ref 39.0–52.0)
HEMOGLOBIN: 10.1 g/dL — AB (ref 13.0–17.0)
LYMPHS ABS: 1.4 10*3/uL (ref 0.7–4.0)
LYMPHS PCT: 10 %
MCH: 27.4 pg (ref 26.0–34.0)
MCHC: 32.3 g/dL (ref 30.0–36.0)
MCV: 85.1 fL (ref 78.0–100.0)
Monocytes Absolute: 2.1 10*3/uL — ABNORMAL HIGH (ref 0.1–1.0)
Monocytes Relative: 15 %
NEUTROS ABS: 11 10*3/uL — AB (ref 1.7–7.7)
Neutrophils Relative %: 75 %
PLATELETS: 159 10*3/uL (ref 150–400)
RBC: 3.68 MIL/uL — AB (ref 4.22–5.81)
RDW: 14.6 % (ref 11.5–15.5)
WBC: 14.6 10*3/uL — AB (ref 4.0–10.5)

## 2015-07-29 LAB — ABO/RH: ABO/RH(D): A POS

## 2015-07-29 LAB — TROPONIN I
TROPONIN I: 0.06 ng/mL — AB (ref <0.031)
Troponin I: <0.03 ng/mL (ref <0.031)
Troponin I: <0.03 ng/mL (ref <0.031)

## 2015-07-29 LAB — GLUCOSE, CAPILLARY
GLUCOSE-CAPILLARY: 103 mg/dL — AB (ref 65–99)
GLUCOSE-CAPILLARY: 69 mg/dL (ref 65–99)
GLUCOSE-CAPILLARY: 99 mg/dL (ref 65–99)
Glucose-Capillary: 78 mg/dL (ref 65–99)
Glucose-Capillary: 98 mg/dL (ref 65–99)

## 2015-07-29 LAB — EXPECTORATED SPUTUM ASSESSMENT W GRAM STAIN, RFLX TO RESP C

## 2015-07-29 LAB — EXPECTORATED SPUTUM ASSESSMENT W REFEX TO RESP CULTURE

## 2015-07-29 LAB — TYPE AND SCREEN
ABO/RH(D): A POS
Antibody Screen: NEGATIVE

## 2015-07-29 LAB — LACTIC ACID, PLASMA: Lactic Acid, Venous: 1.6 mmol/L (ref 0.5–2.0)

## 2015-07-29 MED ORDER — TECHNETIUM TC 99M DIETHYLENETRIAME-PENTAACETIC ACID: 31.1000 | Freq: Once | INTRAVENOUS | Status: DC | PRN

## 2015-07-29 MED ORDER — LINEZOLID 600 MG/300ML IV SOLN
600.0000 mg | Freq: Two times a day (BID) | INTRAVENOUS | Status: DC
  Administered 2015-07-29 – 2015-08-01 (×6): 600 mg via INTRAVENOUS
  Filled 2015-07-29 (×7): qty 300

## 2015-07-29 MED ORDER — GUAIFENESIN 100 MG/5ML PO SOLN
5.0000 mL | ORAL | Status: DC | PRN
  Administered 2015-07-29 – 2015-07-30 (×2): 100 mg via ORAL
  Filled 2015-07-29 (×2): qty 5

## 2015-07-29 MED ORDER — PIPERACILLIN-TAZOBACTAM 3.375 G IVPB
3.3750 g | Freq: Three times a day (TID) | INTRAVENOUS | Status: DC
  Administered 2015-07-29 – 2015-08-02 (×15): 3.375 g via INTRAVENOUS
  Filled 2015-07-29 (×17): qty 50

## 2015-07-29 MED ORDER — OXYCODONE-ACETAMINOPHEN 5-325 MG PO TABS
1.0000 | ORAL_TABLET | Freq: Four times a day (QID) | ORAL | Status: DC | PRN
  Administered 2015-07-29 – 2015-07-31 (×7): 1 via ORAL
  Filled 2015-07-29 (×7): qty 1

## 2015-07-29 MED ORDER — INSULIN ASPART 100 UNIT/ML ~~LOC~~ SOLN
0.0000 [IU] | Freq: Every day | SUBCUTANEOUS | Status: DC
  Administered 2015-08-08 – 2015-08-10 (×2): 2 [IU] via SUBCUTANEOUS

## 2015-07-29 MED ORDER — FLUTICASONE FUROATE-VILANTEROL 100-25 MCG/INH IN AEPB
1.0000 | INHALATION_SPRAY | Freq: Every day | RESPIRATORY_TRACT | Status: DC
  Administered 2015-07-29 – 2015-08-11 (×8): 1 via RESPIRATORY_TRACT
  Filled 2015-07-29 (×5): qty 28

## 2015-07-29 MED ORDER — IPRATROPIUM BROMIDE 0.02 % IN SOLN: 0.5000 mg | Freq: Four times a day (QID) | RESPIRATORY_TRACT | Status: DC

## 2015-07-29 MED ORDER — FENTANYL CITRATE (PF) 100 MCG/2ML IJ SOLN
12.5000 ug | INTRAMUSCULAR | Status: DC | PRN
  Administered 2015-07-30 (×2): 12.5 ug via INTRAVENOUS
  Filled 2015-07-29 (×3): qty 2

## 2015-07-29 MED ORDER — BUPROPION HCL ER (XL) 150 MG PO TB24
150.0000 mg | ORAL_TABLET | Freq: Every day | ORAL | Status: DC
  Administered 2015-07-29 – 2015-08-11 (×14): 150 mg via ORAL
  Filled 2015-07-29 (×15): qty 1

## 2015-07-29 MED ORDER — OXYCODONE HCL ER 15 MG PO T12A
30.0000 mg | EXTENDED_RELEASE_TABLET | Freq: Three times a day (TID) | ORAL | Status: DC
  Administered 2015-07-29 – 2015-08-11 (×39): 30 mg via ORAL
  Filled 2015-07-29 (×41): qty 2

## 2015-07-29 MED ORDER — SODIUM CHLORIDE 0.9 % IV SOLN
INTRAVENOUS | Status: DC
  Administered 2015-07-29: 22:00:00 via INTRAVENOUS
  Administered 2015-07-29: 1 mL via INTRAVENOUS
  Administered 2015-07-29 – 2015-08-02 (×9): via INTRAVENOUS

## 2015-07-29 MED ORDER — INSULIN ASPART 100 UNIT/ML ~~LOC~~ SOLN
0.0000 [IU] | Freq: Three times a day (TID) | SUBCUTANEOUS | Status: DC
  Administered 2015-07-30 (×2): 7 [IU] via SUBCUTANEOUS
  Administered 2015-07-30 – 2015-07-31 (×2): 4 [IU] via SUBCUTANEOUS
  Administered 2015-07-31: 3 [IU] via SUBCUTANEOUS
  Administered 2015-07-31: 4 [IU] via SUBCUTANEOUS
  Administered 2015-08-01: 3 [IU] via SUBCUTANEOUS
  Administered 2015-08-01: 7 [IU] via SUBCUTANEOUS
  Administered 2015-08-02 – 2015-08-03 (×2): 4 [IU] via SUBCUTANEOUS
  Administered 2015-08-03 – 2015-08-04 (×2): 3 [IU] via SUBCUTANEOUS
  Administered 2015-08-04 – 2015-08-05 (×2): 7 [IU] via SUBCUTANEOUS
  Administered 2015-08-05 (×2): 4 [IU] via SUBCUTANEOUS
  Administered 2015-08-06: 3 [IU] via SUBCUTANEOUS
  Administered 2015-08-06 (×2): 7 [IU] via SUBCUTANEOUS
  Administered 2015-08-07 (×3): 4 [IU] via SUBCUTANEOUS
  Administered 2015-08-08: 3 [IU] via SUBCUTANEOUS
  Administered 2015-08-08 (×2): 4 [IU] via SUBCUTANEOUS
  Administered 2015-08-09: 7 [IU] via SUBCUTANEOUS
  Administered 2015-08-09: 4 [IU] via SUBCUTANEOUS
  Administered 2015-08-09: 7 [IU] via SUBCUTANEOUS
  Administered 2015-08-10: 3 [IU] via SUBCUTANEOUS
  Administered 2015-08-10: 7 [IU] via SUBCUTANEOUS
  Administered 2015-08-10 – 2015-08-11 (×4): 4 [IU] via SUBCUTANEOUS
  Administered 2015-08-12: 7 [IU] via SUBCUTANEOUS

## 2015-07-29 MED ORDER — FLUTICASONE PROPIONATE 50 MCG/ACT NA SUSP
1.0000 | Freq: Two times a day (BID) | NASAL | Status: DC | PRN
  Filled 2015-07-29: qty 16

## 2015-07-29 MED ORDER — GABAPENTIN 600 MG PO TABS
600.0000 mg | ORAL_TABLET | Freq: Two times a day (BID) | ORAL | Status: DC
  Administered 2015-07-29 – 2015-08-09 (×23): 600 mg via ORAL
  Filled 2015-07-29 (×25): qty 1

## 2015-07-29 MED ORDER — ONDANSETRON HCL 4 MG/2ML IJ SOLN
4.0000 mg | Freq: Four times a day (QID) | INTRAMUSCULAR | Status: DC | PRN
  Administered 2015-07-31 – 2015-08-06 (×3): 4 mg via INTRAVENOUS
  Filled 2015-07-29 (×3): qty 2

## 2015-07-29 MED ORDER — LEVALBUTEROL HCL 1.25 MG/0.5ML IN NEBU
1.2500 mg | INHALATION_SOLUTION | Freq: Four times a day (QID) | RESPIRATORY_TRACT | Status: DC | PRN
  Administered 2015-08-05: 1.25 mg via RESPIRATORY_TRACT
  Filled 2015-07-29 (×2): qty 0.5

## 2015-07-29 MED ORDER — IPRATROPIUM-ALBUTEROL 0.5-2.5 (3) MG/3ML IN SOLN
3.0000 mL | Freq: Three times a day (TID) | RESPIRATORY_TRACT | Status: DC
  Administered 2015-07-29 – 2015-08-04 (×17): 3 mL via RESPIRATORY_TRACT
  Filled 2015-07-29 (×19): qty 3

## 2015-07-29 MED ORDER — NICOTINE 21 MG/24HR TD PT24
21.0000 mg | MEDICATED_PATCH | Freq: Every day | TRANSDERMAL | Status: DC
  Administered 2015-07-29 – 2015-07-31 (×3): 21 mg via TRANSDERMAL
  Filled 2015-07-29 (×3): qty 1

## 2015-07-29 MED ORDER — ONDANSETRON HCL 4 MG PO TABS: 4.0000 mg | ORAL_TABLET | Freq: Four times a day (QID) | ORAL | Status: DC | PRN

## 2015-07-29 MED ORDER — PANTOPRAZOLE SODIUM 40 MG PO TBEC
40.0000 mg | DELAYED_RELEASE_TABLET | Freq: Every day | ORAL | Status: DC
  Administered 2015-07-29 – 2015-08-11 (×14): 40 mg via ORAL
  Filled 2015-07-29 (×15): qty 1

## 2015-07-29 MED ORDER — ACETAMINOPHEN 650 MG RE SUPP: 650.0000 mg | Freq: Four times a day (QID) | RECTAL | Status: DC | PRN

## 2015-07-29 MED ORDER — ATORVASTATIN CALCIUM 10 MG PO TABS
20.0000 mg | ORAL_TABLET | Freq: Every day | ORAL | Status: DC
  Administered 2015-07-29 – 2015-08-10 (×14): 20 mg via ORAL
  Filled 2015-07-29 (×4): qty 1
  Filled 2015-07-29: qty 2
  Filled 2015-07-29 (×8): qty 1
  Filled 2015-07-29: qty 2

## 2015-07-29 MED ORDER — ACETAMINOPHEN 325 MG PO TABS: 650.0000 mg | ORAL_TABLET | Freq: Four times a day (QID) | ORAL | Status: DC | PRN

## 2015-07-29 NOTE — Progress Notes (Signed)
PT Cancellation Note  Patient Details Name: [name] MRN: [id number] DOB: Sep 03, 1950   Cancelled Treatment:    Reason Eval/Treat Not Completed: Medical issues which prohibited therapy (Dr.Regalado requests hold eval today to rule out PE)   Lanetta Inch Beth 07/29/2015, 9:11 AM Elwyn Reach, [place]

## 2015-07-29 NOTE — Progress Notes (Signed)
Home NIV unit was set up for the patient, o2 humidity provided in the reservoir of the machine. Pt stated that he will wear NIV once he is ready for bed. Pt is stable at this time.

## 2015-07-29 NOTE — H&P (Signed)
[facility] History and Physical  EUGUNE SINE QQP:[id number] DOB: 06/15/1950 DOA: 07/28/2015  Referring physician: PCP: Fransisca Kaufmann Dettinger, MD   Chief Complaint: Continuing to cough blood and had chest pain today  HPI: [name] is a 65 y.o. male past medical history significant for chronic tobacco abuse by 1-2 cigarettes daily, prior basal cell carcinoma diagnosed in 2010, obstructive sleep apnea on CPAP daily at bedtime, esophageal strictures status post dilation, hypertension and hyperlipidemia was recently admitted to our hospital [facility] April 5 through the 8th because patient was thought to have a community-acquired pneumonia as well as a left upper lobe lung mass. Patient was treated with Levaquin and Flagyl and transition to oral Augmentin. Since been taking antibiotics as prescribed. He denied any fever chills or sweating, he's continued to have a cough productive of sputum with hemoptysis. Denied wheezing but does report some chronic shortness of breath. In addition he is also complaining of some pleuritic pain which he described as 10 out of 10 on the left side of his chest. Denied any associated diaphoresis or palpitations, he did report some nausea but no vomiting. Patient was discharged he was supposed to see Dr. Lamonte Sakai who evaluated him last hospitalization of pulmonology. He has not yet had a biopsy of the lung mass, was supposed to go for what looks like a fiberoptic bronchoscopy. Patient also reports a 10 pound weight loss and anorexia. Because of pleuritic pain and worsening hemoptysis came back into the hospital.  In the emergency department he initially presented with a temperature normal at 97.7, his blood pressure was soft at 80/51 and he was saturating in the lower 80s, had to be put on 4 L nasal cannula. [place] doesn't normally require supplemental oxygen. According to patient prior to hospitalization he was put on steroids which he finished a couple days ago.  His white blood cell count was elevated at 17.9 increased from 13.2 at discharge last time. Hemoglobin stable at 10.5 with hematocrit of 32.6. His serum sodium mildly decreased at 133. He went elevated at 40 creatinine was 2.48. When he was discharged on the eighth his creatinine was 1.08. He underwent a repeat chest x-ray which showed a worsening left lower lobe consolidation. He was given some breathing treatments. Patient had EKG which ER was concerned about possible pericarditis. He called cardiology who didn't sound too concerned, because of the worsening hemoptysis with possible [facility] and acute kidney injury hospitals called to readmit.    CODE STATUS DNR/DNI  REVIEW FMHX, SHX reviewed  Review of Systems:  Constitutional: Generalized malaise, reported weight loss 10 pounds.  , and chills. He denied any night sweating. HEENT: Patient reported headaches and left cervical pain which she said started about 5 weeks ago after he fell off a stool and injured his neck, Difficulty swallowing,Tooth/dental problems,Sore throat, Cardio-vascular: Pleuritic pain. GI, Orthopnea, PND, swelling in lower extremities, anasarca, dizziness, palpitations  GI: Stable heartburn, indigestion, denied abdominal pain,. Nausea but no vomiting, diarrhea, change in bowel habits. Did report a loss of appetite  Resp: Cough productive of phlegm and hemoptysis as above in the history of present illness Skin: No bleeding or bruising reported no rash or lesions.  GU: no dysuria, change in color of urine, no urgency or frequency. No flank pain.  Musculoskeletal:  No joint pain or swelling. No decreased range of motion. No back pain.  Psych:No change in mood or affect. No depression or anxiety. No memory loss.  Neuro: No change in  sensation, unilateral strength, or cognitive abilities  All other systems were reviewed and are negative.  Past Medical History  Diagnosis Date  . Essential hypertension, benign   . Type 2  diabetes mellitus ([place])   . Mixed hyperlipidemia   . OSA (obstructive sleep apnea)   . Partial small bowel obstruction ([place])   . 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 Coronary atherosclerosis of native coronary artery     Nonobstructive  . Erythrocytosis   . Peripheral arterial disease (HCC)     70% right iliac  . History of MRSA infection   . Neuropathy ([place]) 2013  . Cancer ([place]) 2015    skin  . Cancer of lung ([place]) 07-24-15   Past Surgical History  Procedure Laterality Date  . Appendectomy    . Neck surgery    . Back surgery    . Hemorrhoid surgery    . Vasectomy     Social History:  reports that he has been smoking Cigarettes.  He has a 40 pack-year smoking history. He has never used smokeless tobacco. He reports that he does not drink alcohol or use illicit drugs.  Allergies  Allergen Reactions  . Nsaids Other (See Comments)    ulcers  . Prednisone Other (See Comments)    High sugar levels  . Vancomycin Other (See Comments)    Caused Stevens-Johnsons syndrome    Family History  Problem Relation Age of Onset  . Coronary artery disease    . Hypertension    . Cancer Mother   . Depression Mother   . Diabetes Mother   . Heart disease Maternal Grandfather   . Hyperlipidemia Maternal Grandfather   . Hypertension Maternal Grandfather      Prior to Admission medications   Medication Sig Start Date End Date Taking? Authorizing Provider  albuterol (PROVENTIL HFA;VENTOLIN HFA) 108 (90 Base) MCG/ACT inhaler Inhale 2 puffs into the lungs every 4 (four) hours as needed for wheezing or shortness of breath. 07/18/15  Yes Fransisca Kaufmann Dettinger, MD  amLODipine (NORVASC) 10 MG tablet Take 10 mg by mouth daily.   Yes Historical Provider, MD  amoxicillin-clavulanate (AUGMENTIN) 875-125 MG tablet Take 1 tablet by mouth 2 (two) times daily. 07/27/15  Yes Thurnell Lose, MD  atenolol (TENORMIN) 100 MG tablet Take 1 tablet (100 mg total) by mouth daily. 04/04/15  Yes Fransisca Kaufmann  Dettinger, MD  atorvastatin (LIPITOR) 20 MG tablet Take 1 tablet (20 mg total) by mouth daily. 04/04/15  Yes Fransisca Kaufmann Dettinger, MD  buPROPion (WELLBUTRIN XL) 150 MG 24 hr tablet TAKE 1 TABLET BY MOUTH DAILY 07/23/15  Yes Fransisca Kaufmann Dettinger, MD  fluticasone furoate-vilanterol (BREO ELLIPTA) 100-25 MCG/INH AEPB Inhale 1 puff into the lungs daily. 07/18/15  Yes Fransisca Kaufmann Dettinger, MD  gabapentin (NEURONTIN) 600 MG tablet TAKE TWO (2) TABLETS THREE (3) TIMES DAILY 04/29/15  Yes Fransisca Kaufmann Dettinger, MD  gemfibrozil (LOPID) 600 MG tablet Take 600 mg by mouth 2 (two) times daily before a meal.    Yes Historical Provider, MD  Insulin Degludec (TRESIBA FLEXTOUCH) 200 UNIT/ML SOPN Inject 95 Units into the skin daily. 05/08/15  Yes Fransisca Kaufmann Dettinger, MD  JANUVIA 100 MG tablet TAKE 1 TABLET BY MOUTH DAILY 07/23/15  Yes Fransisca Kaufmann Dettinger, MD  lisinopril-hydrochlorothiazide (PRINZIDE,ZESTORETIC) 20-12.5 MG tablet TAKE 2 TABLETS BY MOUTH DAILY. Patient taking differently: TAKE 1 TABLETS BY MOUTH 2 TIMES DAILY. 07/23/15  Yes Fransisca Kaufmann Dettinger, MD  metFORMIN (GLUCOPHAGE) 1000 MG  tablet Take 1,000 mg by mouth 2 (two) times daily with a meal.  11/14/13  Yes Historical Provider, MD  NOVOLOG FLEXPEN 100 UNIT/ML FlexPen INJECT UP TO 40 UNITS UP TO FOUR TIMES A DAY 06/24/15  Yes Fransisca Kaufmann Dettinger, MD  omeprazole (PRILOSEC) 40 MG capsule Take 40 mg by mouth daily.    Yes Historical Provider, MD  oxyCODONE (OXYCONTIN) 30 MG 12 hr tablet Take 30 mg by mouth 3 (three) times daily.  01/07/15  Yes Historical Provider, MD  oxyCODONE-acetaminophen (PERCOCET/ROXICET) 5-325 MG tablet Take 1 tablet by mouth every 6 (six) hours as needed. Patient taking differently: Take 1 tablet by mouth every 6 (six) hours as needed for moderate pain.  06/16/15  Yes Lily Kocher, PA-C  tiZANidine (ZANAFLEX) 4 MG tablet Take 4 mg by mouth daily.    Yes Historical Provider, MD  umeclidinium bromide (INCRUSE ELLIPTA) 62.5 MCG/INH AEPB Inhale 1 puff into the  lungs daily. 07/18/15  Yes Fransisca Kaufmann Dettinger, MD  fluticasone (FLONASE) 50 MCG/ACT nasal spray Place 1 spray into both nostrils 2 (two) times daily as needed for allergies or rhinitis. 05/15/15   Fransisca Kaufmann Dettinger, MD  Insulin Pen Needle (PEN NEEDLES) 31G X 6 MM MISC 1 each by Does not apply route 4 (four) times daily. Use with insulin pens to inject insulin up to 5 times daily 03/20/15   Tammy Eckard, PHARMD  predniSONE (DELTASONE) 20 MG tablet 2 po at same time daily for 5 days Patient taking differently: Take 40 mg by mouth daily. 2 po at same time daily for 5 days 07/18/15   Fransisca Kaufmann Dettinger, MD   Physical Exam: Filed Vitals:   07/28/15 2130 07/28/15 2300 07/28/15 2330 07/29/15 0003  BP: 90/63 107/62 94/55 102/61  Pulse: 74 71 153 71  Temp:      TempSrc:      Resp: 14   17  Height:      Weight:      SpO2: 93% 88% 81% 91%    Wt Readings from Last 3 Encounters:  07/28/15 91.4 kg (201 lb 8 oz)  07/24/15 95.618 kg (210 lb 12.8 oz)  07/18/15 93.169 kg (205 lb 6.4 oz)    General: Appears calm and comfortable Eyes:  PERRL, EOMI, normal lids, iris ENT:  grossly normal hearing, lips & tongue Neck:  no LAD, masses or thyromegaly Cardiovascular:  RRR, no m/r/g. No LE edema.  Respiratory: Diminished with some rhonchi left upper lobe, no peripheral rails, his mild wheeze Normal respiratory effort. Abdomen:  soft, ntnd Skin:  no rash extremely dry lower limbs bilateral Musculoskeletal: grossly normal tone BUE/BLE Psychiatric: grossly normal mood and affect, speech fluent and appropriate Neurologic:  CN 2-12 grossly intact, moves all extremities in coordinated fashion.          Labs on Admission:  Basic Metabolic Panel:  Recent Labs Lab 07/23/15 2241 07/24/15 0334 07/25/15 0504 07/28/15 2141  NA 132* 132* 135 133*  K 3.8 3.8 4.0 4.4  CL 95* 94* 97* 94*  CO2 '26 25 24 23  '$ GLUCOSE 105* 153* 175* 74  BUN 26* 27* 44* 40*  CREATININE 1.17 1.15 1.08 2.48*  CALCIUM 8.6* 8.7* 9.3  9.1   Liver Function Tests:  Recent Labs Lab 07/28/15 2141  AST 37  ALT 21  ALKPHOS 68  BILITOT 0.7  PROT 7.0  ALBUMIN 3.3*    Recent Labs Lab 07/28/15 2141  LIPASE 18   No results for input(s): AMMONIA in the last  168 hours. CBC:  Recent Labs Lab 07/23/15 2241 07/24/15 0334 07/25/15 0504 07/28/15 2141  WBC 13.9* 14.6* 13.2* 17.9*  NEUTROABS 10.0*  --   --  13.9*  HGB 11.5* 12.6* 11.5* 10.5*  HCT 34.8* 38.0* 35.1* 32.6*  MCV 86.8 86.8 86.0 85.1  PLT 197 208 238 177   Cardiac Enzymes:  Recent Labs Lab 07/23/15 2240 07/24/15 0334 07/24/15 0921 07/24/15 1422 07/28/15 2141  TROPONINI <0.03 <0.03 <0.03 <0.03 <0.03    BNP (last 3 results)  Recent Labs  07/23/15 2241  BNP 12.0    ProBNP (last 3 results) No results for input(s): PROBNP in the last 8760 hours.   CREATININE: 2.48 mg/dL ABNORMAL (07/28/15 2141) Estimated creatinine clearance - 35.7 mL/min  CBG:  Recent Labs Lab 07/26/15 1204 07/26/15 1721 07/26/15 2153 07/27/15 0802 07/27/15 1158  GLUCAP 213* 235* 270* 139* 161*    Radiological Exams on Admission: Dg Chest Port 1 View  07/28/2015  CLINICAL DATA:  Chest pain and dyspnea, onset today.  Hypotension. EXAM: PORTABLE CHEST 1 VIEW COMPARISON:  07/23/2015 FINDINGS: There is worsened left lower lobe consolidation in this may represent postobstructive pneumonia. Hemorrhage, aspiration, neoplasm could also produce this appearance. There is hilar and mediastinal adenopathy. Right lung is clear. No large effusions. Normal pulmonary vasculature. Chronic right clavicle midshaft fracture deformity. IMPRESSION: Worsened left lower lobe consolidation. Electronically Signed   By: Andreas Newport M.D.   On: 07/28/2015 22:51    EKG: Independently reviewed. Normal sinus rhythm, no pressure cholesterol elevation or depression  Assessment/Plan Active Problems:   Hemoptysis   AKI (acute kidney injury) ([place])   Plan.  1. Acute kidney injury.  Primary reason for admission. Patient was recently discharged couple days ago with a creatinine of 1.08. Patient reportedly had decreased appetite and a 10 pound weight loss. Suspect these are not pending drinking adequately. Will give him some IV hydration and repeat BMP in the morning.  2. Hemoptysis. Patient was seen by Dr. Lamonte Sakai of critical care last hospitalization, he continues to have hemoptysis and was supposed to undergo fiberoptic bronchoscopy. Always return hospital will reconsult pulmonary for further advice. For now keep him nothing by mouth in the event that he could undergo the biopsy for an official diagnosis. Patient also with worsening pneumonia. Problem #3.  3. HCAP. Patient recently admitted to hospital, he was placed on Levaquin and Flagyl can transition to oral Augmentin. Chest x-ray appears to be worse than last day. Will put in for a sputum culture, streptococcal and urine Legionella antigens. Start him on Zosyn 3.375 g 3 times a day. Patient with leukocytosis at 17.9. Reportedly just stopped taking some prednisone which may also be contributing. He is currently afebrile and lactic acid normal at 1.6  4. Pleuritic pain. Suspect secondary to the left upper lobe lung mass and left lower lobe pneumonia. Point was negative, EKG without evidence of ST elevation or depression. For now we'll continue to monitor on telemetry. Patient given a dose of Dilaudid caused him to have decreased respiratory drive desaturated to about 81% but rebounded as well medication wore off. For cautiously with narcotics. See problem #6  5. Iron deficiency anemia. Hemoglobin appears to be relatively stable. Last discharge was 11.5 and today at 10.5. Will repeat CBC in the morning  6. Recent cervical collar fracture occurred about 6 weeks ago. Continues on oxycodone 30 mg twice a day and Percocet 5-3 25 every 6 hours when necessary.   7. Obstructive sleep apnea. CPAP daily  at bedtime  8. Hyperlipidemia.  Patient had elevated lipids on prior to admission. Continue atorvastatin 20 mg daily  9. Depression. Not homicidal or suicidal, continue Wellbutrin 150 mg daily  10. Tobacco abuse. Topical NicoDerm 21 mg, smoking cessation advised  11. GERD. Omeprazole substituted for Protonix 40 mg daily  12. DVT prophylaxis. SCDs given the hemoptysis    Code Status: DNR/DNI  DVT Prophylaxis: SCDs Family Communication: Celesta Gentile' present at the bedside, she agrees with plan questions answered. Disposition Plan: Pending Improvement    Alesia Richards, MD Family Medicine [facility] [email or web address] Password [email or web address]

## 2015-07-29 NOTE — Consult Note (Signed)
Name: [name] MRN: [id number] DOB: [birth year]    ADMISSION DATE:  07/28/2015 CONSULTATION DATE: 07/29/15  REFERRING MD :  Burnett Corrente  CHIEF COMPLAINT:  hemoptysis  BRIEF PATIENT DESCRIPTION: 65 yo male with Hx of HTN, Type 2 DM, OSA,tobacco abuse, prior basal cell carcinoma(2010) was admitted to [facility] from April 5 -8 for CAP and left upper lung mass. Was treated with Levaquin and flagyl and was transitioned to oral Augmentin. Patient now presents with increased cough production and  Worsening Hemoptysis.  He was supposed to undergo fiberoptic bronchoscopy with Dr. Lamonte Sakai  But not yet had a biopsy of the lung mass.  SIGNIFICANT EVENTS  4/10> Readmitted  With HCAP and Hemoptysis    STUDIES:   4/5 >>CT chest was concerning for Extensive masslike nodular opacities in the left lower lobe, with a confluent infrahilar mass measuring 3.2 x 3.2 cm, many of whichare contiguous with left hilar soft tissue density. There isextensive multifocal mediastinal adenopathy. Findings consistentwith malignancy, with primary likely bronchogenic in the left lowerlobe. These masslike opacities encase the left lower lobe pulmonary artery leads luminal narrowing but no discrete invasion or frankpulmonary embolus. Multiple ground-glass opacities in the left lowerlobe, may be pneumonitis, postobstructive atelectasis or spread of malignancy.2. Additional lingular nodule measures 1.5 cm.3. Bubbly density in the left mainstem bronchus and left lower lobe,with left lung bronchial thickening. This is likely secretions or aspiration.4. Moderate emphysema.  HISTORY OF PRESENT ILLNESS: [name] is a 65 year old male with past medical history significant for Tobacco abuse, HTN, basal cell carcinoma (2010), partial small bowel obstruction, OSA,uses C-PAP daily at bedtime, esophageal stricture s/p dilation, hyperlipidemia. He was admitted and treated from April 5 -April 8 due to CAP. He was treated with levaquin and  flagyl and was transitioned to oral Augmentin.  During the previous hospitalization patient had a CT of chest which was concerning for left upper lobe mass.  Patient was discharged on 8th of April, since then he has been complaining of chronic shortness of breath, pleuritic chest pain and worsenening hemoptysis.  Patient reports 10 pounds weight loss and anorexia.  He denies fever, chills, nausea,  Patient was supposed to see Dr. Lamonte Sakai for the bronchoscopy and biopsy of the lung mass which has not taken place as he came back with worsening hemoptysis with HCAP.Marland Kitchen  PCCM team was consulted on 4/10  PAST MEDICAL HISTORY :   has a past medical history of Essential hypertension, benign; Type 2 diabetes mellitus ([place]); Mixed hyperlipidemia; OSA (obstructive sleep apnea); Partial small bowel obstruction ([place]); Stevens-Johnson syndrome ([place]); Coronary atherosclerosis of native coronary artery; Erythrocytosis; Peripheral arterial disease ([place]); History of MRSA infection; Neuropathy ([place]) (2013); Cancer ([place]) (2015); and Cancer of lung ([place]) (07-24-15).  has past surgical history that includes Appendectomy; Neck surgery; Back surgery; Hemorrhoid surgery; and Vasectomy. Prior to Admission medications   Medication Sig Start Date End Date Taking? Authorizing Provider  albuterol (PROVENTIL HFA;VENTOLIN HFA) 108 (90 Base) MCG/ACT inhaler Inhale 2 puffs into the lungs every 4 (four) hours as needed for wheezing or shortness of breath. 07/18/15    Fransisca Kaufmann Dettinger, MD  amLODipine (NORVASC) 10 MG tablet Take 10 mg by mouth daily.   Yes Historical Provider, MD  amoxicillin-clavulanate (AUGMENTIN) 875-125 MG tablet Take 1 tablet by mouth 2 (two) times daily. 07/27/15  Yes Thurnell Lose, MD  atenolol (TENORMIN) 100 MG tablet Take 1 tablet (100 mg total) by mouth daily. 04/04/15  Yes Worthy Rancher, MD  atorvastatin (LIPITOR) 20 MG tablet Take 1 tablet (20 mg total) by mouth daily. 04/04/15  Yes Fransisca Kaufmann Dettinger, MD   buPROPion (WELLBUTRIN XL) 150 MG 24 hr tablet TAKE 1 TABLET BY MOUTH DAILY 07/23/15  Yes Fransisca Kaufmann Dettinger, MD  fluticasone furoate-vilanterol (BREO ELLIPTA) 100-25 MCG/INH AEPB Inhale 1 puff into the lungs daily. 07/18/15  Yes Fransisca Kaufmann Dettinger, MD  gabapentin (NEURONTIN) 600 MG tablet TAKE TWO (2) TABLETS THREE (3) TIMES DAILY 04/29/15  Yes Fransisca Kaufmann Dettinger, MD  gemfibrozil (LOPID) 600 MG tablet Take 600 mg by mouth 2 (two) times daily before a meal.    Yes Historical Provider, MD  Insulin Degludec (TRESIBA FLEXTOUCH) 200 UNIT/ML SOPN Inject 95 Units into the skin daily. 05/08/15  Yes Fransisca Kaufmann Dettinger, MD  JANUVIA 100 MG tablet TAKE 1 TABLET BY MOUTH DAILY 07/23/15  Yes Fransisca Kaufmann Dettinger, MD  lisinopril-hydrochlorothiazide (PRINZIDE,ZESTORETIC) 20-12.5 MG tablet TAKE 2 TABLETS BY MOUTH DAILY. Patient taking differently: TAKE 1 TABLETS BY MOUTH 2 TIMES DAILY. 07/23/15  Yes Fransisca Kaufmann Dettinger, MD  metFORMIN (GLUCOPHAGE) 1000 MG tablet Take 1,000 mg by mouth 2 (two) times daily with a meal.  11/14/13  Yes Historical Provider, MD  NOVOLOG FLEXPEN 100 UNIT/ML FlexPen INJECT UP TO 40 UNITS UP TO FOUR TIMES A DAY 06/24/15  Yes Fransisca Kaufmann Dettinger, MD  omeprazole (PRILOSEC) 40 MG capsule Take 40 mg by mouth daily.    Yes Historical Provider, MD  oxyCODONE (OXYCONTIN) 30 MG 12 hr tablet Take 30 mg by mouth 3 (three) times daily.  01/07/15  Yes Historical Provider, MD  oxyCODONE-acetaminophen (PERCOCET/ROXICET) 5-325 MG tablet Take 1 tablet by mouth every 6 (six) hours as needed. Patient taking differently: Take 1 tablet by mouth every 6 (six) hours as needed for moderate pain.  06/16/15  Yes Lily Kocher, PA-C  tiZANidine (ZANAFLEX) 4 MG tablet Take 4 mg by mouth daily.    Yes Historical Provider, MD  umeclidinium bromide (INCRUSE ELLIPTA) 62.5 MCG/INH AEPB Inhale 1 puff into the lungs daily. 07/18/15  Yes Fransisca Kaufmann Dettinger, MD  fluticasone (FLONASE) 50 MCG/ACT nasal spray Place 1 spray into both nostrils 2 (two)  times daily as needed for allergies or rhinitis. 05/15/15   Fransisca Kaufmann Dettinger, MD  Insulin Pen Needle (PEN NEEDLES) 31G X 6 MM MISC 1 each by Does not apply route 4 (four) times daily. Use with insulin pens to inject insulin up to 5 times daily 03/20/15   Tammy Eckard, PHARMD  predniSONE (DELTASONE) 20 MG tablet 2 po at same time daily for 5 days Patient taking differently: Take 40 mg by mouth daily. 2 po at same time daily for 5 days 07/18/15   Worthy Rancher, MD   Allergies  Allergen Reactions  . Nsaids Other (See Comments)    ulcers  . Prednisone Other (See Comments)    High sugar levels  . Vancomycin Other (See Comments)    Caused Stevens-Johnsons syndrome    FAMILY HISTORY:  family history includes Cancer in his mother; Depression in his mother; Diabetes in his mother; Heart disease in his maternal grandfather; Hyperlipidemia in his maternal grandfather; Hypertension in his maternal grandfather. SOCIAL HISTORY:  reports that he has been smoking Cigarettes.  He has a 40 pack-year smoking history. He has never used smokeless tobacco. He reports that he does not drink alcohol or use illicit drugs.  REVIEW OF SYSTEMS:   Constitutional: Negative for fever, chills, weight loss, malaise/fatigue and diaphoresis.  HENT: Negative for  hearing loss, ear pain, nosebleeds, congestion, sore throat, neck pain, tinnitus and ear discharge.   Eyes: Negative for blurred vision, double vision, photophobia, pain, discharge and redness.  Respiratory: Negative for cough, hemoptysis, sputum production, shortness of breath, wheezing and stridor.   Cardiovascular: Negative for chest pain, palpitations, orthopnea, claudication, leg swelling and PND.  Gastrointestinal: Negative for heartburn, nausea, vomiting, abdominal pain, diarrhea, constipation, blood in stool and melena.  Genitourinary: Negative for dysuria, urgency, frequency, hematuria and flank pain.  Musculoskeletal: Negative for myalgias, back pain,  joint pain and falls.  Skin: Negative for itching and rash.  Neurological: Negative for dizziness, tingling, tremors, sensory change, speech change, focal weakness, seizures, loss of consciousness, weakness and headaches.  Endo/Heme/Allergies: Negative for environmental allergies and polydipsia. Does not bruise/bleed easily.  SUBJECTIVE:  Patient was found sitting on the chair, does not appear to be in acute distress.  States that he has been coughing up blood of different amount. States that he coughed up blood te size of his fist yesterday.  VITAL SIGNS: Temp:  [97.7 F (36.5 C)-98 F (36.7 C)] 97.8 F (36.6 C) (04/10 0641) Pulse Rate:  [71-153] 78 (04/10 0641) Resp:  [14-18] 16 (04/10 0641) BP: (80-120)/(51-67) 109/63 mmHg (04/10 0641) SpO2:  [81 %-100 %] 94 % (04/10 0641) Weight:  [201 lb 8 oz (91.4 kg)-203 lb 0.7 oz (92.1 kg)] 203 lb 0.7 oz (92.1 kg) (04/10 0235)  PHYSICAL EXAMINATION: General:  Sickly appearing , elderly white male Neuro:  Awake, alert, oriented,  HEENT:  HOH, atraumatic, normocephalic,no disharge Cardiovascular:  S1S2, RRR, no MRG Lungs: Diminished bases, no wheezes, crackles, rhonchi Abdomen: soft, nontender, BS+ Musculoskeletal:  No deformity or inflamation nooted Skin:  Grossly intact   Recent Labs Lab 07/25/15 0504 07/28/15 2141 07/29/15 0518  NA 135 133* 134*  K 4.0 4.4 4.5  CL 97* 94* 97*  CO2 '24 23 22  '$ BUN 44* 40* 44*  CREATININE 1.08 2.48* 2.26*  GLUCOSE 175* 74 94    Recent Labs Lab 07/25/15 0504 07/28/15 2141 07/29/15 0518  HGB 11.5* 10.5* 10.1*  HCT 35.1* 32.6* 31.3*  WBC 13.2* 17.9* 14.6*  PLT 238 177 159   Dg Chest Port 1 View  07/28/2015  CLINICAL DATA:  Chest pain and dyspnea, onset today.  Hypotension. EXAM: PORTABLE CHEST 1 VIEW COMPARISON:  07/23/2015 FINDINGS: There is worsened left lower lobe consolidation in this may represent postobstructive pneumonia. Hemorrhage, aspiration, neoplasm could also produce this  appearance. There is hilar and mediastinal adenopathy. Right lung is clear. No large effusions. Normal pulmonary vasculature. Chronic right clavicle midshaft fracture deformity. IMPRESSION: Worsened left lower lobe consolidation. Electronically Signed   By: Andreas Newport M.D.   On: 07/28/2015 22:51    ASSESSMENT / PLAN: A  HCAP Hemoptysis Left  Upper lobe lung mass Hx of tobaco abuse, OSA  P Continue antibiotics per primary Add vancomycin Fiberoptic bronchoscopy and biopsy  for evaluation of lung mass on 4/12 Follow VQ scan Added guaefenessin Follow cultures Continue CPAP at night CXR in am Trend CBC in am Continue Duoneb/ xopnex     Bincy Varughese,AG-ACNP Pulmonary & Critical Care  Attending Note:  65 year old male with PMH of cigarette smoking, recently discharged from the hospital with the diagnosis of a lung mass, presenting back with hemoptysis, SOB, post obstructive pneumonia and the the previously seen lung mass.  On exam, patient is somnolent but arousable, likely has signs of OSA with end exp wheezes in all lung fields.  I reviewed CT myself, lung mass, post obs PNA and mediastinal LAN noted.  Discussed with PCCM-NP, bedside RN, [facility]-MD and Dr. Lamonte Sakai.  Lung mass: likely small cell lung cancer.  - NPO after midnight Tuesday night into Wednesday morning.  - INR on Wednesday morning.  - Bronch Wednesday 2 PM.  Post obstructive pneumonia:  - Abx as ordered.  - F/U on culture.  - May require radiation.  Hemoptysis: likely related to lung mass.  - Insure coags are normals.  - Monitor.  - Bronch on Wednesday 2 PM.  Wheezing: ?COPD by history, no PFT's noted.  - Breo-ellipta  - Duonebs.  - PRN albuterol.  Smoking cessation.  PCCM will follow.  Patient seen and examined, agree with above note.  I dictated the care and orders written for this patient under my direction.  Rush Farmer, MD [phone]

## 2015-07-29 NOTE — Progress Notes (Addendum)
[facility] PROGRESS NOTE  [name] OIN:[id number] DOB: [birth year] DOA: 07/28/2015 PCP: Fransisca Kaufmann Dettinger, MD  Assessment/Plan: 1. Acute kidney injury. Primary reason for admission. Patient was recently discharged couple days ago with a creatinine of 1.08. Patient reportedly had decreased appetite and a 10 pound weight loss. Suspect these are not pending drinking adequately. Continue with IV fluids.   2. Hemoptysis. Chest pain  Patient was seen by Dr. Lamonte Sakai of critical care last hospitalization, he continues to have hemoptysis and was supposed to undergo fiberoptic bronchoscopy.  -Always return hospital will reconsult pulmonary for further advice.  -For now keep him nothing by mouth in the event that he could undergo the biopsy for an official diagnosis. -CCM consulted.  -complaining of chest pain, will ordered V-Q scan.  -cycle enzymes.  Discussed with Pulmonary PA, Per attending no need for anticoagulation , VQ scan likely reflect lung mass.   3. HCAP. Patient recently admitted to hospital, he was placed on Levaquin and Flagyl can transition to oral Augmentin. Chest x-ray appears to be worse than last day.  - sputum culture, streptococcal and urine Legionella antigens pending -Continue with  Zosyn 3.375 g 3 times a day.    4. Pleuritic pain. Suspect secondary to the left upper lobe lung mass and left lower lobe pneumonia. Point was negative, EKG without evidence of ST elevation or depression. For now we'll continue to monitor on telemetry.   5. Iron deficiency anemia. Hemoglobin appears to be relatively stable.   6. Recent cervical collar fracture occurred about 6 weeks ago. Continues on oxycodone 30 mg twice a day and Percocet 5-3 25 every 6 hours when necessary.   7. Obstructive sleep apnea. CPAP daily at bedtime  8. Hyperlipidemia.  Continue atorvastatin 20 mg daily  9. Depression. continue Wellbutrin 150 mg daily  10. Tobacco abuse. Topical NicoDerm 21 mg,  smoking cessation advised  11. GERD.  Protonix 40 mg daily  12. DVT prophylaxis. SCDs given the hemoptysis  Code Status: DNR Family Communication: Wife at bedside.  Disposition Plan: Remain inpatient.    Consultants:  Pulmonary   Procedures:  none  Antibiotics:  Zosyn 4-10    HPI/Subjective: He is still having chest pain, and shortness of breath. Pain got worse yesterday. He started to have also worsening hemoptysis.   Objective: Filed Vitals:   07/29/15 0235 07/29/15 0641  BP: 120/61 109/63  Pulse: 77 78  Temp: 98 F (36.7 C) 97.8 F (36.6 C)  Resp: 17 16   No intake or output data in the 24 hours ending 07/29/15 0840 Filed Weights   07/28/15 2058 07/29/15 0235  Weight: 91.4 kg (201 lb 8 oz) 92.1 kg (203 lb 0.7 oz)    Exam:   General:  NAD  Cardiovascular: S 1, S 2 RRR  Respiratory: CTA  Abdomen: BS present, soft, nt  Musculoskeletal: no edema.   Data Reviewed: Basic Metabolic Panel:  Recent Labs Lab 07/23/15 2241 07/24/15 0334 07/25/15 0504 07/28/15 2141 07/29/15 0518  NA 132* 132* 135 133* 134*  K 3.8 3.8 4.0 4.4 4.5  CL 95* 94* 97* 94* 97*  CO2 '26 25 24 23 22  '$ GLUCOSE 105* 153* 175* 74 94  BUN 26* 27* 44* 40* 44*  CREATININE 1.17 1.15 1.08 2.48* 2.26*  CALCIUM 8.6* 8.7* 9.3 9.1 8.5*   Liver Function Tests:  Recent Labs Lab 07/28/15 2141  AST 37  ALT 21  ALKPHOS 68  BILITOT 0.7  PROT 7.0  ALBUMIN 3.3*  Recent Labs Lab 07/28/15 2141  LIPASE 18   No results for input(s): AMMONIA in the last 168 hours. CBC:  Recent Labs Lab 07/23/15 2241 07/24/15 0334 07/25/15 0504 07/28/15 2141 07/29/15 0518  WBC 13.9* 14.6* 13.2* 17.9* 14.6*  NEUTROABS 10.0*  --   --  13.9* 11.0*  HGB 11.5* 12.6* 11.5* 10.5* 10.1*  HCT 34.8* 38.0* 35.1* 32.6* 31.3*  MCV 86.8 86.8 86.0 85.1 85.1  PLT 197 208 238 177 159   Cardiac Enzymes:  Recent Labs Lab 07/23/15 2240 07/24/15 0334 07/24/15 0921 07/24/15 1422 07/28/15 2141   TROPONINI <0.03 <0.03 <0.03 <0.03 <0.03   BNP (last 3 results)  Recent Labs  07/23/15 2241  BNP 12.0    ProBNP (last 3 results) No results for input(s): PROBNP in the last 8760 hours.  CBG:  Recent Labs Lab 07/26/15 2153 07/27/15 0802 07/27/15 1158 07/29/15 0324 07/29/15 0822  GLUCAP 270* 139* 161* 103* 78    Recent Results (from the past 240 hour(s))  MRSA PCR Screening     Status: None   Collection Time: 07/24/15  2:55 AM  Result Value Ref Range Status   MRSA by PCR NEGATIVE NEGATIVE Final    Comment:        The GeneXpert MRSA Assay (FDA approved for NASAL specimens only), is one component of a comprehensive MRSA colonization surveillance program. It is not intended to diagnose MRSA infection nor to guide or monitor treatment for MRSA infections.   Culture, sputum-assessment     Status: None   Collection Time: 07/29/15  7:34 AM  Result Value Ref Range Status   Specimen Description SPUTUM  Final   Special Requests NONE  Final   Sputum evaluation   Final    THIS SPECIMEN IS ACCEPTABLE. RESPIRATORY CULTURE REPORT TO FOLLOW.   Report Status 07/29/2015 FINAL  Final     Studies: Dg Chest Port 1 View  07/28/2015  CLINICAL DATA:  Chest pain and dyspnea, onset today.  Hypotension. EXAM: PORTABLE CHEST 1 VIEW COMPARISON:  07/23/2015 FINDINGS: There is worsened left lower lobe consolidation in this may represent postobstructive pneumonia. Hemorrhage, aspiration, neoplasm could also produce this appearance. There is hilar and mediastinal adenopathy. Right lung is clear. No large effusions. Normal pulmonary vasculature. Chronic right clavicle midshaft fracture deformity. IMPRESSION: Worsened left lower lobe consolidation. Electronically Signed   By: Andreas Newport M.D.   On: 07/28/2015 22:51    Scheduled Meds: . atorvastatin  20 mg Oral Daily  . buPROPion  150 mg Oral Daily  . fluticasone furoate-vilanterol  1 puff Inhalation Daily  . gabapentin  600 mg Oral BID   . insulin aspart  0-20 Units Subcutaneous TID WC  . insulin aspart  0-5 Units Subcutaneous QHS  . ipratropium-albuterol  3 mL Nebulization TID  . nicotine  21 mg Transdermal Daily  . oxyCODONE  30 mg Oral TID  . pantoprazole  40 mg Oral Daily  . piperacillin-tazobactam (ZOSYN)  IV  3.375 g Intravenous 3 times per day   Continuous Infusions: . sodium chloride 1 mL (07/29/15 0314)    Active Problems:   Hemoptysis   AKI (acute kidney injury) ([place])    Time spent: 25 minutes.     Niel Hummer A  [facility] Pager [phone]. If 7PM-7AM, please contact night-coverage at [email or web address], password [email or web address] 07/29/2015, 8:40 AM  LOS: 0 days

## 2015-07-29 NOTE — Progress Notes (Addendum)
Pharmacy Antibiotic Note  [name] is a 65 y.o. male admitted on 07/28/2015 with pneumonia.  Pharmacy has been consulted for vancomycin dosing, however pt has allergy to vancomycin.  Paging K. Schorr with [facility], will switch to linezolid for now.  Scr 2.26, est CrCl ~ 40 ml/min.  Plan: 1. Linezolid 600 mg IV q 12 hrs. 2. Watch renal function, cultures.  Height: '5\' 11"'$  (180.3 cm) Weight: 203 lb 0.7 oz (92.1 kg) IBW/kg (Calculated) : 75.3  Temp (24hrs), Avg:98.2 F (36.8 C), Min:97.7 F (36.5 C), Max:98.9 F (37.2 C)   Recent Labs Lab 07/23/15 2241 07/24/15 0334 07/25/15 0504 07/28/15 2141 07/28/15 2316 07/29/15 0518  WBC 13.9* 14.6* 13.2* 17.9*  --  14.6*  CREATININE 1.17 1.15 1.08 2.48*  --  2.26*  LATICACIDVEN  --   --   --   --  1.6  --     Estimated Creatinine Clearance: 39.3 mL/min (by C-G formula based on Cr of 2.26).    Allergies  Allergen Reactions  . Nsaids Other (See Comments)    ulcers  . Prednisone Other (See Comments)    High sugar levels  . Vancomycin Other (See Comments)    Caused Stevens-Johnsons syndrome    Antimicrobials this admission: Zosyn >> 4/9 Linezolid>> 4/10 >  Dose adjustments this admission: n/a  Microbiology results: 4/10 BCx x 2:  4/10 Sputum:    Thank you for allowing pharmacy to be a part of this patient's care.  Uvaldo Rising, BCPS  Clinical Pharmacist Pager [phone]  07/29/2015 8:40 PM

## 2015-07-29 NOTE — Progress Notes (Signed)
Initial Nutrition Assessment  DOCUMENTATION CODES:   Not applicable  INTERVENTION:   -RD will follow for diet advancement and supplement diet as appropriate   NUTRITION DIAGNOSIS:   Inadequate oral intake related to altered GI function as evidenced by NPO status.  GOAL:   Patient will meet greater than or equal to 90% of their needs  MONITOR:   Diet advancement, Labs, Weight trends, Skin, I & O's  REASON FOR ASSESSMENT:   Consult Assessment of nutrition requirement/status  ASSESSMENT:   [name] is a 65 y.o. male past medical history significant for chronic tobacco abuse by 1-2 cigarettes daily, prior basal cell carcinoma diagnosed in 2010, obstructive sleep apnea on CPAP daily at bedtime, esophageal strictures status post dilation, hypertension and hyperlipidemia was recently admitted to our hospital [facility] April 5 through the 8th because patient was thought to have a community-acquired pneumonia as well as a left upper lobe lung mass.  Pt admitted with AKI and hemotypsis.   Pt was very drowsy at time of visit. Hx was mainly obtained from pt's significant other at bedside. Pt reports that he was eating very little over the past 2 days related to vomiting. Pt significant other reports she has observed a general decline in health over the past month. Pt is typically a very hearty eater, consuming 3 meals and 2-3 snacks daily (Breakfast: muffin Or grits, eggs, and toast; Lunch: sandwich: Dinner: meat, starch, and vegetable). Pt snacks and muffins and crackers throughout the day. Significant other reports pt has been eating significantly less.   Pt UBW is around 215#. Significant other reports pt has experienced approximately a 15#6.7%) wt loss over the past month, however, this is not consistent with wt hx. Wt hx reviewed and no significant changes noted for time frames given.   Nutrition-Focused physical exam completed. Findings are no fat depletion, no muscle  depletion, and mild edema. Significant other noted edema on rt hand and arm, likely related to IV site.   Pt is awaiting pulmonology consult; he is currently NPO for potential biopsy.   Labs reviewed: Na: 134 (on IV supplementation), CBGS: 78-103.   Diet Order:  Diet NPO time specified  Skin:  Reviewed, no issues  Last BM:  07/26/15  Height:   Ht Readings from Last 1 Encounters:  07/29/15 '5\' 11"'$  (1.803 m)    Weight:   Wt Readings from Last 1 Encounters:  07/29/15 203 lb 0.7 oz (92.1 kg)    Ideal Body Weight:  78.2 kg  BMI:  Body mass index is 28.33 kg/(m^2).  Estimated Nutritional Needs:   Kcal:  1950-2150  Protein:  90-100 grams  Fluid:  1.9-2.1 L  EDUCATION NEEDS:   No education needs identified at this time  Charron Coultas A. Jimmye Norman, RD, LDN, CDE Pager: [phone] After hours Pager: [phone]

## 2015-07-29 NOTE — Progress Notes (Signed)
OT Cancellation Note  Patient Details Name: KHAMRON GELLERT MRN: [id number] DOB: [birth year]   Cancelled Treatment:    Reason Eval/Treat Not Completed: Medical issues which prohibited therapy (Dr.Regalado requests hold eval today to rule out PE).  Redmond Baseman, OTR/L Pager: [phone] 07/29/2015, 10:40 AM

## 2015-07-30 ENCOUNTER — Inpatient Hospital Stay (HOSPITAL_COMMUNITY): Payer: Medicare Other

## 2015-07-30 ENCOUNTER — Other Ambulatory Visit: Payer: Self-pay

## 2015-07-30 ENCOUNTER — Encounter: Payer: Self-pay | Admitting: Orthopaedic Surgery

## 2015-07-30 ENCOUNTER — Ambulatory Visit: Payer: Medicare Other | Admitting: Orthopaedic Surgery

## 2015-07-30 ENCOUNTER — Inpatient Hospital Stay: Payer: Medicare Other

## 2015-07-30 DIAGNOSIS — R0602 Shortness of breath: Secondary | ICD-10-CM

## 2015-07-30 DIAGNOSIS — N179 Acute kidney failure, unspecified: Secondary | ICD-10-CM

## 2015-07-30 LAB — BASIC METABOLIC PANEL
Anion gap: 13 (ref 5–15)
BUN: 27 mg/dL — AB (ref 6–20)
CALCIUM: 9.1 mg/dL (ref 8.9–10.3)
CO2: 22 mmol/L (ref 22–32)
CREATININE: 1.28 mg/dL — AB (ref 0.61–1.24)
Chloride: 99 mmol/L — ABNORMAL LOW (ref 101–111)
GFR calc Af Amer: >60 mL/min (ref >60)
GFR, EST NON AFRICAN AMERICAN: 58 mL/min — AB (ref >60)
GLUCOSE: 182 mg/dL — AB (ref 65–99)
Potassium: 4.1 mmol/L (ref 3.5–5.1)
SODIUM: 134 mmol/L — AB (ref 135–145)

## 2015-07-30 LAB — TROPONIN I

## 2015-07-30 LAB — GLUCOSE, CAPILLARY
GLUCOSE-CAPILLARY: 154 mg/dL — AB (ref 65–99)
GLUCOSE-CAPILLARY: 207 mg/dL — AB (ref 65–99)
GLUCOSE-CAPILLARY: 150 mg/dL — AB (ref 65–99)
Glucose-Capillary: 125 mg/dL — ABNORMAL HIGH (ref 65–99)
Glucose-Capillary: 185 mg/dL — ABNORMAL HIGH (ref 65–99)
Glucose-Capillary: 199 mg/dL — ABNORMAL HIGH (ref 65–99)

## 2015-07-30 LAB — CBC
HEMATOCRIT: 31.6 % — AB (ref 39.0–52.0)
HEMOGLOBIN: 10.1 g/dL — AB (ref 13.0–17.0)
MCH: 27.7 pg (ref 26.0–34.0)
MCHC: 32 g/dL (ref 30.0–36.0)
MCV: 86.6 fL (ref 78.0–100.0)
PLATELETS: 161 10*3/uL (ref 150–400)
RBC: 3.65 MIL/uL — ABNORMAL LOW (ref 4.22–5.81)
RDW: 14.7 % (ref 11.5–15.5)
WBC: 15.3 10*3/uL — ABNORMAL HIGH (ref 4.0–10.5)

## 2015-07-30 MED ORDER — FENTANYL CITRATE (PF) 100 MCG/2ML IJ SOLN
25.0000 ug | INTRAMUSCULAR | Status: DC | PRN
  Administered 2015-07-30 – 2015-07-31 (×4): 25 ug via INTRAVENOUS
  Filled 2015-07-30 (×4): qty 2

## 2015-07-30 MED ORDER — HYDROXYZINE HCL 25 MG PO TABS
25.0000 mg | ORAL_TABLET | Freq: Three times a day (TID) | ORAL | Status: DC | PRN
  Administered 2015-07-30 – 2015-08-07 (×5): 25 mg via ORAL
  Filled 2015-07-30 (×6): qty 1

## 2015-07-30 MED ORDER — FENTANYL CITRATE (PF) 100 MCG/2ML IJ SOLN
25.0000 ug | Freq: Once | INTRAMUSCULAR | Status: AC
Start: 2015-07-30 — End: 2015-07-30
  Administered 2015-07-30: 25 ug via INTRAVENOUS

## 2015-07-30 MED ORDER — GUAIFENESIN-DM 100-10 MG/5ML PO SYRP
5.0000 mL | ORAL_SOLUTION | ORAL | Status: DC | PRN
  Administered 2015-07-30 – 2015-07-31 (×2): 5 mL via ORAL
  Filled 2015-07-30 (×3): qty 5

## 2015-07-30 MED ORDER — DILTIAZEM HCL 25 MG/5ML IV SOLN
5.0000 mg | Freq: Once | INTRAVENOUS | Status: AC
  Administered 2015-07-30: 5 mg via INTRAVENOUS
  Filled 2015-07-30: qty 5

## 2015-07-30 MED ORDER — DILTIAZEM HCL 25 MG/5ML IV SOLN
5.0000 mg | Freq: Once | INTRAVENOUS | Status: AC
  Administered 2015-07-30: 5 mg via INTRAVENOUS

## 2015-07-30 MED ORDER — DEXTROSE 5 % IV SOLN
5.0000 mg/h | INTRAVENOUS | Status: DC
  Administered 2015-07-30 – 2015-07-31 (×2): 7.5 mg/h via INTRAVENOUS
  Filled 2015-07-30 (×3): qty 100

## 2015-07-30 NOTE — Progress Notes (Signed)
VASCULAR LAB PRELIMINARY  PRELIMINARY  PRELIMINARY  PRELIMINARY  Bilateral lower extremity venous duplex completed.     Bilateral:  No evidence of DVT, superficial thrombosis, or Baker's Cyst.   Janifer Adie, RVT, RDMS 07/30/2015, 4:16 PM   .

## 2015-07-30 NOTE — Evaluation (Signed)
Physical Therapy Evaluation Patient Details Name: [name] MRN: [id number] DOB: [birth year] Today's Date: 07/30/2015   History of Present Illness  65 y.o. male admitted to [facility] on 07/28/15 with chest pain and hemoptysis.  Dx with acute kidney injury, hemoptysis (will undergo bronchoscopy with possible biopsy 07/31/15), HCAP, and pleuritis chest pain.  Pt with significant PMhx of collar bone fx ~6 weeks prior to admission, essential HTN, DM2, Stevens-Johnson syndrome, PAD, neuropathy, neck surgery, back surgery, and basal cell carcinoma (2010).    Clinical Impression  Pt is very weak and deconditioned with very limited activity tolerance due to poor respiratory tolerance to activity.  He was able to walk a short distance into the hallway with 6 L O2 [place] maintaining sats in the low 90s and HR in the 110s.  He has support at discharge, but could use further education on energy conservation and would likely do better with a RW (rollator with a seat would likely do best as he could sit when he gets SOB).   PT to follow acutely for deficits listed below.       Follow Up Recommendations Home health PT;Supervision/Assistance - 24 hour    Equipment Recommendations  Rolling walker with 5" wheels;Other (comment) (rollator- 4 wheels and a seat)    Recommendations for Other Services   NA    Precautions / Restrictions Precautions Precautions: Other (comment);Fall Precaution Comments: monitor O2 sats      Mobility  Bed Mobility Overal bed mobility: Modified Independent             General bed mobility comments: HOB elevated and pt using bed rails to get to sitting.   Transfers Overall transfer level: Needs assistance Equipment used: None Transfers: Sit to/from Stand Sit to Stand: Supervision         General transfer comment: pt using bed rail for leverage and to steady himself during transitions.   Ambulation/Gait Ambulation/Gait assistance: Min guard Ambulation Distance (Feet):  55 Feet Assistive device: None Gait Pattern/deviations: Step-through pattern;Staggering left;Staggering right Gait velocity: decreased Gait velocity interpretation: <1.8 ft/sec, indicative of risk for recurrent falls General Gait Details: Multiple, frequent standing rest breaks due to lightheadedness, weakness, and DOE.  6 L O2 [place] during gait.  HR 110s and O2 91-92%.           Balance Overall balance assessment: Needs assistance Sitting-balance support: Feet supported;No upper extremity supported Sitting balance-Leahy Scale: Good     Standing balance support: Single extremity supported;No upper extremity supported Standing balance-Leahy Scale: Fair                               Pertinent Vitals/Pain Pain Assessment: Faces Faces Pain Scale: Hurts even more Pain Location: chest pain Pain Descriptors / Indicators: Grimacing;Guarding Pain Intervention(s): Limited activity within patient's tolerance;Monitored during session;Repositioned    Home Living Family/patient expects to be discharged to:: Private residence Living Arrangements: Spouse/significant other (fiancee) Available Help at Discharge: Family;Available 24 hours/day Type of Home: House Home Access: Stairs to enter Entrance Stairs-Rails: None Entrance Stairs-Number of Steps: 2 Home Layout: One level        Prior Function Level of Independence: Independent         Comments: Has never used home O2        Extremity/Trunk Assessment   Upper Extremity Assessment: Defer to OT evaluation           Lower Extremity Assessment: Generalized weakness  Cervical / Trunk Assessment: Other exceptions  Communication   Communication: No difficulties  Cognition Arousal/Alertness: Awake/alert Behavior During Therapy: WFL for tasks assessed/performed Overall Cognitive Status: Within Functional Limits for tasks assessed                               Assessment/Plan    PT Assessment  Patient needs continued PT services  PT Diagnosis Difficulty walking;Abnormality of gait;Generalized weakness;Acute pain   PT Problem List Decreased strength;Decreased activity tolerance;Decreased balance;Decreased mobility;Decreased knowledge of use of DME;Pain;Cardiopulmonary status limiting activity  PT Treatment Interventions DME instruction;Gait training;Stair training;Functional mobility training;Therapeutic activities;Therapeutic exercise;Balance training;Patient/family education   PT Goals (Current goals can be found in the Care Plan section) Acute Rehab PT Goals Patient Stated Goal: to get his breathing better as he feels as though at times he cannot catch his breath and it scares him.  PT Goal Formulation: With patient/family Time For Goal Achievement: 08/13/15 Potential to Achieve Goals: Good    Frequency Min 3X/week           End of Session Equipment Utilized During Treatment: Oxygen Activity Tolerance: Patient limited by fatigue;Other (comment) (limited by pulmonary tolerance) Patient left: in chair;with call bell/phone within reach;with family/visitor present Nurse Communication: Mobility status         Time: 1856-3149 PT Time Calculation (min) (ACUTE ONLY): 31 min   Charges:   PT Evaluation $PT Eval Moderate Complexity: 1 Procedure PT Treatments $Gait Training: 8-22 mins        Daylin Eads B. [place], [place], DPT [phone]   07/30/2015, 5:25 PM

## 2015-07-30 NOTE — Progress Notes (Signed)
Central tele called RN to report that patient had gone into a-fib with RVR. NP Schorr paged. Awaiting any further orders.

## 2015-07-30 NOTE — Consult Note (Signed)
Name: [name] MRN: [id number] DOB: [birth year]    ADMISSION DATE:  07/28/2015 CONSULTATION DATE: 07/29/15  REFERRING MD :  Burnett Corrente  CHIEF COMPLAINT:  hemoptysis  BRIEF PATIENT DESCRIPTION: 65 yo male with Hx of HTN, Type 2 DM, OSA,tobacco abuse, prior basal cell carcinoma(2010) was admitted to [facility] from April 5 -8 for CAP and left upper lung mass. Was treated with Levaquin and flagyl and was transitioned to oral Augmentin. Patient now presents with increased cough production and  Worsening Hemoptysis.  He was supposed to undergo fiberoptic bronchoscopy with Dr. Lamonte Sakai  But not yet had a biopsy of the lung mass.  SIGNIFICANT EVENTS  4/10> Readmitted  With HCAP and Hemoptysis    STUDIES:   4/5 >>CT chest was concerning for Extensive masslike nodular opacities in the left lower lobe, with a confluent infrahilar mass measuring 3.2 x 3.2 cm, many of whichare contiguous with left hilar soft tissue density. There isextensive multifocal mediastinal adenopathy. Findings consistentwith malignancy, with primary likely bronchogenic in the left lowerlobe. These masslike opacities encase the left lower lobe pulmonary artery leads luminal narrowing but no discrete invasion or frankpulmonary embolus. Multiple ground-glass opacities in the left lowerlobe, may be pneumonitis, postobstructive atelectasis or spread of malignancy.2. Additional lingular nodule measures 1.5 cm.3. Bubbly density in the left mainstem bronchus and left lower lobe,with left lung bronchial thickening. This is likely secretions or aspiration.4. Moderate emphysema.  predniSONE (DELTASONE) 20 MG tablet 2 po at same time daily for 5 days Patient taking differently: Take 40 mg by mouth daily. 2 po at same time daily for 5 days 07/18/15   Worthy Rancher, MD   Allergies  Allergen Reactions  . Nsaids Other (See Comments)    ulcers  . Prednisone Other (See Comments)    High sugar levels  . Vancomycin Other (See Comments)   Caused Stevens-Johnsons syndrome    FAMILY HISTORY:  family history includes Cancer in his mother; Depression in his mother; Diabetes in his mother; Heart disease in his maternal grandfather; Hyperlipidemia in his maternal grandfather; Hypertension in his maternal grandfather. SOCIAL HISTORY:  reports that he has been smoking Cigarettes.  He has a 40 pack-year smoking history. He has never used smokeless tobacco. He reports that he does not drink alcohol or use illicit drugs.  REVIEW OF SYSTEMS:   Constitutional: Negative for fever, chills, weight loss, malaise/fatigue and diaphoresis.  HENT: Negative for hearing loss, ear pain, nosebleeds, congestion, sore throat, neck pain, tinnitus and ear discharge.   Eyes: Negative for blurred vision, double vision, photophobia, pain, discharge and redness.  Respiratory: Negative for cough, hemoptysis, sputum production, shortness of breath, wheezing and stridor.   Cardiovascular: Negative for chest pain, palpitations, orthopnea, claudication, leg swelling and PND.  Gastrointestinal: Negative for heartburn, nausea, vomiting, abdominal pain, diarrhea, constipation, blood in stool and melena.  Genitourinary: Negative for dysuria, urgency, frequency, hematuria and flank pain.  Musculoskeletal: Negative for myalgias, back pain, joint pain and falls.  Skin: Negative for itching and rash.  Neurological: Negative for dizziness, tingling, tremors, sensory change, speech change, focal weakness, seizures, loss of consciousness, weakness and headaches.  Endo/Heme/Allergies: Negative for environmental allergies and polydipsia. Does not bruise/bleed easily.  SUBJECTIVE:  Patient was found sitting on the chair with CPAP on , .  States that he is anxious about tomorrows procedure.  VITAL SIGNS: Temp:  [98.1 F (36.7 C)-98.9 F (37.2 C)] 98.1 F (36.7 C) (04/11 0419) Pulse Rate:  [100-104] 104 (04/10 2155) Resp:  [  18-21] 21 (04/11 0419) BP: (114-150)/(54-67)  150/67 mmHg (04/11 0419) SpO2:  [86 %-95 %] 93 % (04/11 0855)  PHYSICAL EXAMINATION: General:  Sickly appearing , elderly white male Neuro:  Awake, alert, oriented,  HEENT:  HOH, atraumatic, normocephalic,no disharge Cardiovascular:  S1S2, RRR, no MRG Lungs: Diminished bases, no wheezes, crackles, rhonchi Abdomen: soft, nontender, BS+ Musculoskeletal:  No deformity or inflamation nooted Skin:  Grossly intact and dry   Recent Labs Lab 07/28/15 2141 07/29/15 0518 07/30/15 0654  NA 133* 134* 134*  K 4.4 4.5 4.1  CL 94* 97* 99*  CO2 '23 22 22  '$ BUN 40* 44* 27*  CREATININE 2.48* 2.26* 1.28*  GLUCOSE 74 94 182*    Recent Labs Lab 07/28/15 2141 07/29/15 0518 07/30/15 0654  HGB 10.5* 10.1* 10.1*  HCT 32.6* 31.3* 31.6*  WBC 17.9* 14.6* 15.3*  PLT 177 159 161   Nm Pulmonary Perf And Vent  07/29/2015  CLINICAL DATA:  Patient with postsurgical chest pain and hypoxia. Patient could not elevate her arms. EXAM: NUCLEAR MEDICINE VENTILATION - PERFUSION LUNG SCAN TECHNIQUE: Ventilation images were obtained in multiple projections using inhaled aerosol Tc-64mDTPA. Perfusion images were obtained in multiple projections after intravenous injection of Tc-963mAA. RADIOPHARMACEUTICALS:  31.1 mCi Technetium-9931mPA aerosol inhalation and 4.2 mCi Technetium-23m44m IV COMPARISON:  Chest radiograph, 07/28/2015 FINDINGS: Ventilation: Decreased ventilation to left lung base corresponding to the area of airspace consolidation as well as overall decreased activity in the left lung when compared to the right. Perfusion: Normal profusion to the right lung. Decreased profusion seen throughout the left lung with no discrete segmental defect. IMPRESSION: 1. Decreased profusion to the entire left lung. There is also decreased ventilation to the left lung and more discrete ventilatory defect the left lung base where there is consolidation on the current chest radiograph. This is an intermediate probability study  for pulmonary thromboembolism based on modified prior PET criteria. Consider followup CTA of the chest if this patient can tolerate that procedure. Electronically Signed   By: DaviLajean Manes.   On: 07/29/2015 12:08   Dg Chest Port 1 View  07/28/2015  CLINICAL DATA:  Chest pain and dyspnea, onset today.  Hypotension. EXAM: PORTABLE CHEST 1 VIEW COMPARISON:  07/23/2015 FINDINGS: There is worsened left lower lobe consolidation in this may represent postobstructive pneumonia. Hemorrhage, aspiration, neoplasm could also produce this appearance. There is hilar and mediastinal adenopathy. Right lung is clear. No large effusions. Normal pulmonary vasculature. Chronic right clavicle midshaft fracture deformity. IMPRESSION: Worsened left lower lobe consolidation. Electronically Signed   By: DaniAndreas Newport.   On: 07/28/2015 22:51    ASSESSMENT / PLAN: A  HCAP Hemoptysis Left  Upper lobe lung mass Hx of tobaco abuse, OSA  P Continue Zyvox/ zosyn Fiberoptic bronchoscopy and biopsy  for evaluation of lung mass on 4/12 threfore NPO after midnight on 4/11 INR on 4/12prior to procedure added guaifenesin dextromethorphan Follow cultures Continue CPAP as needed CXR in am Trend CBC in am Continue Duoneb/ xopnex Follow Duplex of lower extremities    Aiya Keach,AG-ACNP Pulmonary & Critical Care

## 2015-07-30 NOTE — Clinical Documentation Improvement (Signed)
Internal Medicine  Possible Conditions?       Respiratory Failure  Document Acuity - Acute, Chronic, Acute on Chronic  Document Inclusion Of - Hypoxia, Hypercapnia, Combination of Both  Document Tobacco - Use, Abuse, History Of  Other  Clinically Undetermined  Document any associated diagnoses/conditions. Please update your documentation within the medical record to reflect your response to this query. Thank you.  Supporting Information:(As per notes)"Onset today chest pain, shortness of breath, low BP, O2 sats at home 79-80% Pt is pale, labored breathing. Dx with lung CA 07-24-15." & "was saturating in the lower 80s, had to be put on 4 L nasal cannula" & "Verbal order for nonrebreather given. Placed pt on NRB" Pt has been given 5L/m via [place].  Please exercise your independent, professional judgment when responding. A specific answer is not anticipated or expected.  Thank You, Alessandra Grout, RN, BSN, CCDS,Clinical Documentation Specialist:  [phone]  [phone]=[facility] [place]- [facility] Information Management

## 2015-07-30 NOTE — Progress Notes (Signed)
Patient is complaining of chest pain that is intermittent and has been going on all day. NP schorr notified and pain medication given.

## 2015-07-30 NOTE — Progress Notes (Addendum)
Shift event: This NP called by my colleague at [facility] who is covering floor calls for 6N. Pt with new onset Afib and CP. EKG was done for CP but unable to upload into EPIC. NP to floor. Full note to follow.  S: Pt states he feels the "worst he has ever felt in his life". Pt denies every having irregular heartbeat before. His CP is located central sternum radiating to the left anterior neck and to the LUE proximal to the elbow. Pain is 10/10 even after 12.'5mg'$  Fentanyl. Describes as an "elephant sitting on his chest" with a "thump" every now and then. States it is worse than the CP he has had previously during hospitalization. SOB is not worsened. Still having hemoptysis and has to sit up to keep from "choking".  O: Poorly appearing WM who appears older than stated age. NAD. Alert & oriented x 4. Seems upset and a tad anxious. Cooperative. Card: irreg irreg. EKG with Afib/rvr. No distinct acute changes noted on 12 lead.  A/P: 1. New onset Afib with RVR-Cardizem '5mg'$  IV push x 2 and HR in the 1 teens now. BP 116/70s. RR normal. O2 sat normal on [place]. Likely due to acute illness and lung mass. TF to SDU to ensure drip can be titrated more than once if needed. Start Cardizem drip in SDU and follow closely.  2. CP-his story is a little inconsistent in re: symptom description. Doubt cardiac pain in favor of the pleuritic pain he has had since admission. Will cycle troponins again. Can not anticoagulate him due to hemoptysis. On O2. Increase Fentanyl dose. Call cardiology as needed. 3. Lung mass-NPO after MN for bronchoscopy tomorrow. PCCM aware of hemoptysis. Hgb stable. R/p am. Elevate HOB at all times.  Clance Boll, NP [facility] Update: BP stable on Cardizem. Pt continues to c/o CP but troponin neg so far. Will try some NTG ointment q8h and see if it changes his pain at all. Parameters to hold if SBP < 100. Discussed with RN. If no change, attending can d/c this am.  KJKG, NP [facility] Update:  Cardizem weaned to 7.5/hr and HR in 80-90 range. BP stable. NTG ointment applied around 0500 hrs.  KJKG, NP

## 2015-07-30 NOTE — Progress Notes (Signed)
OT Cancellation Note  Patient Details Name: [name] MRN: [id number] DOB: March 09, 1951   Cancelled Treatment:    Reason Eval/Treat Not Completed:  (Pt about to go down for test.)  Benito Mccreedy OTR/L [phone] 07/30/2015, 3:32 PM

## 2015-07-30 NOTE — Progress Notes (Signed)
[facility] PROGRESS NOTE  [name] XBW:[id number] DOB: July 29, 1950 DOA: 07/28/2015 PCP: Fransisca Kaufmann Dettinger, MD  Assessment/Plan: 1. Acute kidney injury. Primary reason for admission. Patient was recently discharged couple days ago with a creatinine of 1.08. Patient reportedly had decreased appetite and a 10 pound weight loss. Suspect these are not pending drinking adequately. Continue with IV fluids.   2. Hemoptysis. Chest pain  Patient was seen by Dr. Lamonte Sakai of critical care last hospitalization, he continues to have hemoptysis and was supposed to undergo fiberoptic bronchoscopy most likely tomorrow.  -For now keep him nothing by mouth in the event that he could undergo the biopsy for an official diagnosis. -CCM consulted.  Discussed with Pulmonary PA, Per attending no need for anticoagulation , VQ scan likely reflect lung mass.   3. HCAP. Patient recently admitted to hospital, he was placed on Levaquin and Flagyl can transition to oral Augmentin. Chest x-ray appears to be worse than last day.  - sputum culture, streptococcal and urine Legionella antigens pending -Continue with  Zosyn 3.375 g 3 times a day.    4. Pleuritic pain. Suspect secondary to the left upper lobe lung mass and left lower lobe pneumonia. Point was negative, EKG without evidence of ST elevation or depression. For now we'll continue to monitor on telemetry.   5. Iron deficiency anemia. Hemoglobin appears to be relatively stable.   6. Recent cervical collar fracture occurred about 6 weeks ago. Continues on oxycodone 30 mg twice a day and Percocet 5-3 25 every 6 hours when necessary.   7. Obstructive sleep apnea. CPAP daily at bedtime  8. Hyperlipidemia.  Continue atorvastatin 20 mg daily  9. Depression. continue Wellbutrin 150 mg daily  10. Tobacco abuse. Topical NicoDerm 21 mg, smoking cessation advised  11. GERD.  Protonix 40 mg daily  12. DVT prophylaxis. SCDs given the hemoptysis  Code Status:  DNR Family Communication: Wife at bedside.  Disposition Plan: Remain inpatient.    Consultants:  Pulmonary   Procedures:  none  Antibiotics:  Zosyn 4-10    HPI/Subjective: No new complaints reported.  Objective: Filed Vitals:   07/30/15 0419 07/30/15 1432  BP: 150/67 170/70  Pulse:  109  Temp: 98.1 F (36.7 C) 98.2 F (36.8 C)  Resp: 21 22    Intake/Output Summary (Last 24 hours) at 07/30/15 1639 Last data filed at 07/30/15 1313  Gross per 24 hour  Intake   1390 ml  Output   2650 ml  Net  -1260 ml   Filed Weights   07/28/15 2058 07/29/15 0235  Weight: 91.4 kg (201 lb 8 oz) 92.1 kg (203 lb 0.7 oz)    Exam:   General:  NAD, alert and awake  Cardiovascular: S 1, S 2 RRR  Respiratory: rhales, no wheezes, equal chest rise.  Abdomen: BS present, soft, nt  Musculoskeletal: no edema.   Data Reviewed: Basic Metabolic Panel:  Recent Labs Lab 07/24/15 0334 07/25/15 0504 07/28/15 2141 07/29/15 0518 07/30/15 0654  NA 132* 135 133* 134* 134*  K 3.8 4.0 4.4 4.5 4.1  CL 94* 97* 94* 97* 99*  CO2 '25 24 23 22 22  '$ GLUCOSE 153* 175* 74 94 182*  BUN 27* 44* 40* 44* 27*  CREATININE 1.15 1.08 2.48* 2.26* 1.28*  CALCIUM 8.7* 9.3 9.1 8.5* 9.1   Liver Function Tests:  Recent Labs Lab 07/28/15 2141  AST 37  ALT 21  ALKPHOS 68  BILITOT 0.7  PROT 7.0  ALBUMIN 3.3*  Recent Labs Lab 07/28/15 2141  LIPASE 18   No results for input(s): AMMONIA in the last 168 hours. CBC:  Recent Labs Lab 07/23/15 2241 07/24/15 0334 07/25/15 0504 07/28/15 2141 07/29/15 0518 07/30/15 0654  WBC 13.9* 14.6* 13.2* 17.9* 14.6* 15.3*  NEUTROABS 10.0*  --   --  13.9* 11.0*  --   HGB 11.5* 12.6* 11.5* 10.5* 10.1* 10.1*  HCT 34.8* 38.0* 35.1* 32.6* 31.3* 31.6*  MCV 86.8 86.8 86.0 85.1 85.1 86.6  PLT 197 208 238 177 159 161   Cardiac Enzymes:  Recent Labs Lab 07/28/15 2141 07/29/15 0927 07/29/15 1440 07/29/15 1714 07/29/15 2105  TROPONINI <0.03 <0.03  <0.03 0.06* <0.03   BNP (last 3 results)  Recent Labs  07/23/15 2241  BNP 12.0    ProBNP (last 3 results) No results for input(s): PROBNP in the last 8760 hours.  CBG:  Recent Labs Lab 07/29/15 1306 07/29/15 1729 07/29/15 2102 07/30/15 0800 07/30/15 1309  GLUCAP 98 99 125* 154* 207*    Recent Results (from the past 240 hour(s))  MRSA PCR Screening     Status: None   Collection Time: 07/24/15  2:55 AM  Result Value Ref Range Status   MRSA by PCR NEGATIVE NEGATIVE Final    Comment:        The GeneXpert MRSA Assay (FDA approved for NASAL specimens only), is one component of a comprehensive MRSA colonization surveillance program. It is not intended to diagnose MRSA infection nor to guide or monitor treatment for MRSA infections.   Culture, blood (routine x 2) Call MD if unable to obtain prior to antibiotics being given     Status: None (Preliminary result)   Collection Time: 07/29/15  1:37 AM  Result Value Ref Range Status   Specimen Description BLOOD RIGHT ARM  Final   Special Requests BOTTLES DRAWN AEROBIC ONLY 5CC  Final   Culture NO GROWTH 1 DAY  Final   Report Status PENDING  Incomplete  Culture, blood (routine x 2) Call MD if unable to obtain prior to antibiotics being given     Status: None (Preliminary result)   Collection Time: 07/29/15  1:42 AM  Result Value Ref Range Status   Specimen Description BLOOD RIGHT ARM  Final   Special Requests BOTTLES DRAWN AEROBIC ONLY 5CC  Final   Culture NO GROWTH 1 DAY  Final   Report Status PENDING  Incomplete  Culture, sputum-assessment     Status: None   Collection Time: 07/29/15  7:34 AM  Result Value Ref Range Status   Specimen Description SPUTUM  Final   Special Requests NONE  Final   Sputum evaluation   Final    THIS SPECIMEN IS ACCEPTABLE. RESPIRATORY CULTURE REPORT TO FOLLOW.   Report Status 07/29/2015 FINAL  Final  Culture, respiratory (NON-Expectorated)     Status: None (Preliminary result)   Collection  Time: 07/29/15  8:20 AM  Result Value Ref Range Status   Specimen Description SPUTUM  Final   Special Requests NONE  Final   Gram Stain   Final    ABUNDANT WBC PRESENT,BOTH PMN AND MONONUCLEAR FEW SQUAMOUS EPITHELIAL CELLS PRESENT NO ORGANISMS SEEN Performed at [facility]    Culture PENDING  Incomplete   Report Status PENDING  Incomplete     Studies: Nm Pulmonary Perf And Vent  07/29/2015  CLINICAL DATA:  Patient with postsurgical chest pain and hypoxia. Patient could not elevate her arms. EXAM: NUCLEAR MEDICINE VENTILATION - PERFUSION LUNG SCAN TECHNIQUE: Ventilation  images were obtained in multiple projections using inhaled aerosol Tc-13mDTPA. Perfusion images were obtained in multiple projections after intravenous injection of Tc-920mAA. RADIOPHARMACEUTICALS:  31.1 mCi Technetium-9965mPA aerosol inhalation and 4.2 mCi Technetium-34m17m IV COMPARISON:  Chest radiograph, 07/28/2015 FINDINGS: Ventilation: Decreased ventilation to left lung base corresponding to the area of airspace consolidation as well as overall decreased activity in the left lung when compared to the right. Perfusion: Normal profusion to the right lung. Decreased profusion seen throughout the left lung with no discrete segmental defect. IMPRESSION: 1. Decreased profusion to the entire left lung. There is also decreased ventilation to the left lung and more discrete ventilatory defect the left lung base where there is consolidation on the current chest radiograph. This is an intermediate probability study for pulmonary thromboembolism based on modified prior PET criteria. Consider followup CTA of the chest if this patient can tolerate that procedure. Electronically Signed   By: DaviLajean Manes.   On: 07/29/2015 12:08   Dg Chest Port 1 View  07/28/2015  CLINICAL DATA:  Chest pain and dyspnea, onset today.  Hypotension. EXAM: PORTABLE CHEST 1 VIEW COMPARISON:  07/23/2015 FINDINGS: There is worsened left lower lobe  consolidation in this may represent postobstructive pneumonia. Hemorrhage, aspiration, neoplasm could also produce this appearance. There is hilar and mediastinal adenopathy. Right lung is clear. No large effusions. Normal pulmonary vasculature. Chronic right clavicle midshaft fracture deformity. IMPRESSION: Worsened left lower lobe consolidation. Electronically Signed   By: DaniAndreas Newport.   On: 07/28/2015 22:51    Scheduled Meds: . atorvastatin  20 mg Oral Daily  . buPROPion  150 mg Oral Daily  . fluticasone furoate-vilanterol  1 puff Inhalation Daily  . gabapentin  600 mg Oral BID  . insulin aspart  0-20 Units Subcutaneous TID WC  . insulin aspart  0-5 Units Subcutaneous QHS  . ipratropium-albuterol  3 mL Nebulization TID  . linezolid (ZYVOX) IV  600 mg Intravenous Q12H  . nicotine  21 mg Transdermal Daily  . oxyCODONE  30 mg Oral TID  . pantoprazole  40 mg Oral Daily  . piperacillin-tazobactam (ZOSYN)  IV  3.375 g Intravenous 3 times per day   Continuous Infusions: . sodium chloride 100 mL/hr at 07/30/15 0515    Active Problems:   Hemoptysis   AKI (acute kidney injury) (HCC)[place] Time spent: 25 minutes.     [facility] Pager [phone] 7PM-7AM, please contact night-coverage at [email or web address], password [email or web address], 4:39 PM  LOS: 1 day

## 2015-07-30 NOTE — Progress Notes (Signed)
PT Cancellation Note  Patient Details Name: [name] MRN: [id number] DOB: 08/23/1950   Cancelled Treatment:    Reason Eval/Treat Not Completed: Patient at procedure or test/unavailable.  Pt is leaving room currently for a test.  PT will check back later today or tomorrow as time allows.  Thanks,    Barbarann Ehlers. [place], [place], DPT [phone]   07/30/2015, 3:36 PM

## 2015-07-31 ENCOUNTER — Encounter (HOSPITAL_COMMUNITY)
Admission: EM | Disposition: A | Discharge status: Hospice | Payer: Self-pay | Source: Home / Self Care | Attending: Family Medicine

## 2015-07-31 ENCOUNTER — Ambulatory Visit (HOSPITAL_COMMUNITY): Admission: RE | Admit: 2015-07-31 | Payer: Medicare Other | Source: Ambulatory Visit | Admitting: Emergency Medicine

## 2015-07-31 ENCOUNTER — Inpatient Hospital Stay (HOSPITAL_COMMUNITY): Admit: 2015-07-31 | Payer: Medicare Other

## 2015-07-31 DIAGNOSIS — I4891 Unspecified atrial fibrillation: Secondary | ICD-10-CM

## 2015-07-31 DIAGNOSIS — F17200 Nicotine dependence, unspecified, uncomplicated: Secondary | ICD-10-CM

## 2015-07-31 LAB — GLUCOSE, CAPILLARY
GLUCOSE-CAPILLARY: 150 mg/dL — AB (ref 65–99)
GLUCOSE-CAPILLARY: 149 mg/dL — AB (ref 65–99)
GLUCOSE-CAPILLARY: 173 mg/dL — AB (ref 65–99)
GLUCOSE-CAPILLARY: 176 mg/dL — AB (ref 65–99)
Glucose-Capillary: 153 mg/dL — ABNORMAL HIGH (ref 65–99)

## 2015-07-31 LAB — CBC WITH DIFFERENTIAL/PLATELET
BASOS ABS: 0 10*3/uL (ref 0.0–0.1)
BASOS PCT: 0 %
EOS ABS: 0 10*3/uL (ref 0.0–0.7)
EOS PCT: 0 %
HCT: 30.6 % — ABNORMAL LOW (ref 39.0–52.0)
Hemoglobin: 9.8 g/dL — ABNORMAL LOW (ref 13.0–17.0)
Lymphocytes Relative: 5 %
Lymphs Abs: 0.7 10*3/uL (ref 0.7–4.0)
MCH: 27.1 pg (ref 26.0–34.0)
MCHC: 32 g/dL (ref 30.0–36.0)
MCV: 84.5 fL (ref 78.0–100.0)
Monocytes Absolute: 1.3 10*3/uL — ABNORMAL HIGH (ref 0.1–1.0)
Monocytes Relative: 9 %
Neutro Abs: 12.4 10*3/uL — ABNORMAL HIGH (ref 1.7–7.7)
Neutrophils Relative %: 86 %
PLATELETS: 154 10*3/uL (ref 150–400)
RBC: 3.62 MIL/uL — AB (ref 4.22–5.81)
RDW: 14.3 % (ref 11.5–15.5)
WBC: 14.4 10*3/uL — AB (ref 4.0–10.5)

## 2015-07-31 LAB — BASIC METABOLIC PANEL
ANION GAP: 11 (ref 5–15)
BUN: 10 mg/dL (ref 6–20)
CO2: 24 mmol/L (ref 22–32)
Calcium: 9 mg/dL (ref 8.9–10.3)
Chloride: 99 mmol/L — ABNORMAL LOW (ref 101–111)
Creatinine, Ser: 0.9 mg/dL (ref 0.61–1.24)
GFR calc Af Amer: >60 mL/min (ref >60)
GFR calc non Af Amer: >60 mL/min (ref >60)
Glucose, Bld: 158 mg/dL — ABNORMAL HIGH (ref 65–99)
POTASSIUM: 3.7 mmol/L (ref 3.5–5.1)
SODIUM: 134 mmol/L — AB (ref 135–145)

## 2015-07-31 LAB — CULTURE, RESPIRATORY W GRAM STAIN

## 2015-07-31 LAB — RNA QUALITATIVE: HIV 1 RNA Qualitative: 1

## 2015-07-31 LAB — HIV 1/2 AB DIFFERENTIATION
HIV 1 Ab: NEGATIVE
HIV 2 AB: NEGATIVE

## 2015-07-31 LAB — CULTURE, RESPIRATORY

## 2015-07-31 LAB — TROPONIN I
TROPONIN I: 0.03 ng/mL (ref <0.031)
TROPONIN I: 0.04 ng/mL — AB (ref <0.031)

## 2015-07-31 LAB — PROTIME-INR
INR: 1.34 (ref 0.00–1.49)
PROTHROMBIN TIME: 16.7 s — AB (ref 11.6–15.2)

## 2015-07-31 LAB — STREP PNEUMONIAE URINARY ANTIGEN: STREP PNEUMO URINARY ANTIGEN: NEGATIVE

## 2015-07-31 LAB — APTT: APTT: 33 s (ref 24–37)

## 2015-07-31 LAB — HIV ANTIBODY (ROUTINE TESTING W REFLEX)

## 2015-07-31 SURGERY — VIDEO BRONCHOSCOPY WITHOUT FLUORO
Anesthesia: Moderate Sedation | Laterality: Bilateral

## 2015-07-31 MED ORDER — METOPROLOL TARTRATE 1 MG/ML IV SOLN
2.5000 mg | Freq: Four times a day (QID) | INTRAVENOUS | Status: DC | PRN
  Administered 2015-08-01 – 2015-08-02 (×2): 2.5 mg via INTRAVENOUS
  Filled 2015-07-31 (×2): qty 5

## 2015-07-31 MED ORDER — CETYLPYRIDINIUM CHLORIDE 0.05 % MT LIQD
7.0000 mL | Freq: Two times a day (BID) | OROMUCOSAL | Status: DC
  Administered 2015-07-31 – 2015-08-09 (×16): 7 mL via OROMUCOSAL

## 2015-07-31 MED ORDER — AMIODARONE HCL IN DEXTROSE 360-4.14 MG/200ML-% IV SOLN
30.0000 mg/h | INTRAVENOUS | Status: DC
  Administered 2015-07-31 – 2015-08-01 (×3): 30 mg/h via INTRAVENOUS
  Filled 2015-07-31 (×4): qty 200

## 2015-07-31 MED ORDER — NITROGLYCERIN 2 % TD OINT
0.5000 [in_us] | TOPICAL_OINTMENT | Freq: Three times a day (TID) | TRANSDERMAL | Status: DC
  Administered 2015-07-31 – 2015-08-05 (×8): 0.5 [in_us] via TOPICAL
  Filled 2015-07-31: qty 0.5

## 2015-07-31 MED ORDER — LORAZEPAM 1 MG PO TABS
1.0000 mg | ORAL_TABLET | Freq: Four times a day (QID) | ORAL | Status: DC | PRN
  Administered 2015-07-31 – 2015-08-10 (×25): 1 mg via ORAL
  Filled 2015-07-31 (×26): qty 1

## 2015-07-31 MED ORDER — FENTANYL CITRATE (PF) 100 MCG/2ML IJ SOLN
50.0000 ug | INTRAMUSCULAR | Status: DC | PRN
  Administered 2015-07-31 – 2015-08-10 (×27): 50 ug via INTRAVENOUS
  Filled 2015-07-31 (×27): qty 2

## 2015-07-31 MED ORDER — AMIODARONE HCL IN DEXTROSE 360-4.14 MG/200ML-% IV SOLN
60.0000 mg/h | INTRAVENOUS | Status: AC
  Administered 2015-07-31 (×2): 60 mg/h via INTRAVENOUS
  Filled 2015-07-31: qty 200

## 2015-07-31 MED ORDER — OXYCODONE-ACETAMINOPHEN 5-325 MG PO TABS
1.0000 | ORAL_TABLET | Freq: Four times a day (QID) | ORAL | Status: DC | PRN
  Administered 2015-07-31 – 2015-08-03 (×9): 2 via ORAL
  Filled 2015-07-31 (×10): qty 2

## 2015-07-31 MED ORDER — AMIODARONE LOAD VIA INFUSION
150.0000 mg | Freq: Once | INTRAVENOUS | Status: AC
  Administered 2015-07-31: 150 mg via INTRAVENOUS
  Filled 2015-07-31: qty 83.34

## 2015-07-31 NOTE — Significant Event (Signed)
Patient converted to sinus rhythm, HR in the 90s. CCM at bedside. Received orders to switch to amiodarone drip.

## 2015-07-31 NOTE — Care Management Note (Signed)
Case Management Note  Patient Details  Name: [name] MRN: [id number] Date of Birth: 04-Feb-1951  Subjective/Objective:         Adm w at fib, copd           Action/Plan: lives at home, pcp dr Building control surveyor   Expected Discharge Date:                  Expected Discharge Plan:  [place]  In-House Referral:     Discharge planning Services  CM Consult  Post Acute Care Choice:    Choice offered to:     DME Arranged:    DME Agency:     HH Arranged:    [place] Agency:     Status of Service:     Medicare Important Message Given:    Date Medicare IM Given:    Medicare IM give by:    Date Additional Medicare IM Given:    Additional Medicare Important Message give by:     If discussed at [place] of Stay Meetings, dates discussed:    Additional Comments: will moniter for dc needs as pt progresses  Lacretia Leigh, RN 07/31/2015, 9:49 AM

## 2015-07-31 NOTE — Consult Note (Signed)
CARDIOLOGY CONSULT NOTE   Patient ID: [name] MRN: [id number] DOB/AGE: 1950-08-08 65 y.o.  Admit date: 07/28/2015  Primary Physician   Fransisca Kaufmann Dettinger, MD Primary Cardiologist  Dr. Domenic Polite (last seen 02/13/2011) Reason for Consultation   Afib with RVR Requesting Physician  Dr. Wendee Beavers  HPI: [name] is a 65 y.o. male with a history of HTN, DM, HL, OSA on CPAP at night, PAD 70% right iliac, ongoing tobacco abuse, prior basal cell carcinoma (2010) and recent admission 07/24/15-07/27/15 for CAP and left lung mass on CT. The patient was treated with Levaquin and flagyl- -> Augmentin. He was discharged with plan to perform outpatient fiberoptic bronchoscopy by Dr. Lamonte Sakai. However presented 07/29/15 with worsening cough and hemoptysis. He ED he was hypotensive at 80/51, saturating at 80s --> requiring supplemental oxygen. Hemoglobin stable at 10.5 with hematocrit of 32.6. Repeat chest x-ray which showed a worsening left lower lobe consolidation.  Patient had EKG which ER was concerned about possible pericarditis. He called cardiology who didn't sound too concerned.  Because of the worsening hemoptysis with possible HCHP and acute kidney injury hospitalists called to readmit. Yesterday 4/11  the patient went in to afib around 19:52  at rate of 151bpm, placed on IV Cardizem. He was going in and out of afib at max rate of 160s. maintining sinus rhythm since 10am and started on amidarone IV to prevent back and forth to avoid stroke. Discontinued Cardizem. The patient does have substernal chest pain describes as "elephant sitting/ stabbing". Original plan was to perfrom bronchoscopy today, however cancelled due to active bleeding (hemoptysis) and had  fresh blood clumps coughed up this morning.   Cardiac cath for ongoing chest pain done in 12/24/2010 showed mild to moderate atherosclerosis with slow flow noted in the LAD. It was suggested that this could be related to his relative erythrocytosis  (likely secondary to smoking), though certainly with his risk factor profile, endothelial dysfunction and microvascular dysfunction are just as likely.  His first diagonal artery has a 40-50% stenosis at its origin. He did  Have chronic SOB. Says quit smoking  3 weeks ago. No chest pain or palpations.    Past Medical History  Diagnosis Date  . Essential hypertension, benign   . Type 2 diabetes mellitus ([place])   . Mixed hyperlipidemia   . OSA (obstructive sleep apnea)   . Partial small bowel obstruction ([place])   . 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 Coronary atherosclerosis of native coronary artery     Nonobstructive  . Erythrocytosis   . Peripheral arterial disease ([facility])     70% right iliac  . History of MRSA infection   . Neuropathy ([place]) 2013  . Cancer ([place]) 2015    skin  . Cancer of lung ([place]) 07-24-15     Past Surgical History  Procedure Laterality Date  . Appendectomy    . Neck surgery    . Back surgery    . Hemorrhoid surgery    . Vasectomy      Allergies  Allergen Reactions  . Nsaids Other (See Comments)    ulcers  . Prednisone Other (See Comments)    High sugar levels  . Vancomycin Other (See Comments)    Caused Stevens-Johnsons syndrome    I have reviewed the patient's current medications . antiseptic oral rinse  7 mL Mouth Rinse BID  . atorvastatin  20 mg Oral Daily  . buPROPion  150 mg Oral Daily  .  fluticasone furoate-vilanterol  1 puff Inhalation Daily  . gabapentin  600 mg Oral BID  . insulin aspart  0-20 Units Subcutaneous TID WC  . insulin aspart  0-5 Units Subcutaneous QHS  . ipratropium-albuterol  3 mL Nebulization TID  . linezolid (ZYVOX) IV  600 mg Intravenous Q12H  . nitroGLYCERIN  0.5 inch Topical 3 times per day  . oxyCODONE  30 mg Oral TID  . pantoprazole  40 mg Oral Daily  . piperacillin-tazobactam (ZOSYN)  IV  3.375 g Intravenous 3 times per day   . sodium chloride 100 mL/hr at 07/31/15 0511  . amiodarone 60 mg/hr  (07/31/15 1119)   Followed by  . amiodarone    . diltiazem (CARDIZEM) infusion Stopped (07/31/15 1119)   acetaminophen **OR** acetaminophen, fentaNYL (SUBLIMAZE) injection, fluticasone, guaiFENesin-dextromethorphan, hydrOXYzine, levalbuterol, LORazepam, metoprolol, ondansetron **OR** ondansetron (ZOFRAN) IV, oxyCODONE-acetaminophen, technetium TC 62M diethylenetriame-pentaacetic acid  Prior to Admission medications   Medication Sig Start Date End Date Taking? Authorizing Provider  albuterol (PROVENTIL HFA;VENTOLIN HFA) 108 (90 Base) MCG/ACT inhaler Inhale 2 puffs into the lungs every 4 (four) hours as needed for wheezing or shortness of breath. 07/18/15  Yes Fransisca Kaufmann Dettinger, MD  amLODipine (NORVASC) 10 MG tablet Take 10 mg by mouth daily.   Yes Historical Provider, MD  amoxicillin-clavulanate (AUGMENTIN) 875-125 MG tablet Take 1 tablet by mouth 2 (two) times daily. 07/27/15  Yes Thurnell Lose, MD  atenolol (TENORMIN) 100 MG tablet Take 1 tablet (100 mg total) by mouth daily. 04/04/15  Yes Fransisca Kaufmann Dettinger, MD  atorvastatin (LIPITOR) 20 MG tablet Take 1 tablet (20 mg total) by mouth daily. 04/04/15  Yes Fransisca Kaufmann Dettinger, MD  buPROPion (WELLBUTRIN XL) 150 MG 24 hr tablet TAKE 1 TABLET BY MOUTH DAILY 07/23/15  Yes Fransisca Kaufmann Dettinger, MD  fluticasone furoate-vilanterol (BREO ELLIPTA) 100-25 MCG/INH AEPB Inhale 1 puff into the lungs daily. 07/18/15  Yes Fransisca Kaufmann Dettinger, MD  gabapentin (NEURONTIN) 600 MG tablet TAKE TWO (2) TABLETS THREE (3) TIMES DAILY 04/29/15  Yes Fransisca Kaufmann Dettinger, MD  gemfibrozil (LOPID) 600 MG tablet Take 600 mg by mouth 2 (two) times daily before a meal.    Yes Historical Provider, MD  Insulin Degludec (TRESIBA FLEXTOUCH) 200 UNIT/ML SOPN Inject 95 Units into the skin daily. 05/08/15  Yes Fransisca Kaufmann Dettinger, MD  JANUVIA 100 MG tablet TAKE 1 TABLET BY MOUTH DAILY 07/23/15  Yes Fransisca Kaufmann Dettinger, MD  lisinopril-hydrochlorothiazide (PRINZIDE,ZESTORETIC) 20-12.5 MG tablet TAKE 2  TABLETS BY MOUTH DAILY. Patient taking differently: TAKE 1 TABLETS BY MOUTH 2 TIMES DAILY. 07/23/15  Yes Fransisca Kaufmann Dettinger, MD  metFORMIN (GLUCOPHAGE) 1000 MG tablet Take 1,000 mg by mouth 2 (two) times daily with a meal.  11/14/13  Yes Historical Provider, MD  NOVOLOG FLEXPEN 100 UNIT/ML FlexPen INJECT UP TO 40 UNITS UP TO FOUR TIMES A DAY 06/24/15  Yes Fransisca Kaufmann Dettinger, MD  omeprazole (PRILOSEC) 40 MG capsule Take 40 mg by mouth daily.    Yes Historical Provider, MD  oxyCODONE (OXYCONTIN) 30 MG 12 hr tablet Take 30 mg by mouth 3 (three) times daily.  01/07/15  Yes Historical Provider, MD  oxyCODONE-acetaminophen (PERCOCET/ROXICET) 5-325 MG tablet Take 1 tablet by mouth every 6 (six) hours as needed. Patient taking differently: Take 1 tablet by mouth every 6 (six) hours as needed for moderate pain.  06/16/15  Yes Lily Kocher, PA-C  tiZANidine (ZANAFLEX) 4 MG tablet Take 4 mg by mouth daily.    Yes Historical Provider,  MD  umeclidinium bromide (INCRUSE ELLIPTA) 62.5 MCG/INH AEPB Inhale 1 puff into the lungs daily. 07/18/15  Yes Fransisca Kaufmann Dettinger, MD  fluticasone (FLONASE) 50 MCG/ACT nasal spray Place 1 spray into both nostrils 2 (two) times daily as needed for allergies or rhinitis. 05/15/15   Fransisca Kaufmann Dettinger, MD  Insulin Pen Needle (PEN NEEDLES) 31G X 6 MM MISC 1 each by Does not apply route 4 (four) times daily. Use with insulin pens to inject insulin up to 5 times daily 03/20/15   Tammy Eckard, PHARMD  predniSONE (DELTASONE) 20 MG tablet 2 po at same time daily for 5 days Patient taking differently: Take 40 mg by mouth daily. 2 po at same time daily for 5 days 07/18/15   Worthy Rancher, MD     Social History   Social History  . Marital Status: Divorced    Spouse Name: N/A  . Number of Children: 7  . Years of Education: N/A   Occupational History  . UNEMPLOYED    Social History Main Topics  . Smoking status: Current Every Day Smoker -- 1.00 packs/day for 40 years    Types:  Cigarettes  . Smokeless tobacco: Never Used  . Alcohol Use: No  . Drug Use: No  . Sexual Activity: Not on file   Other Topics Concern  . Not on file   Social History Narrative    Family Status  Relation Status Death Age  . Mother Deceased   . Maternal Grandmother Deceased   . Maternal Grandfather Deceased    Family History  Problem Relation Age of Onset  . Coronary artery disease    . Hypertension    . Cancer Mother   . Depression Mother   . Diabetes Mother   . Heart disease Maternal Grandfather   . Hyperlipidemia Maternal Grandfather   . Hypertension Maternal Grandfather     ROS:  Full 14 point review of systems complete and found to be negative unless listed above.  Physical Exam: Blood pressure 134/63, pulse 87, temperature 97.6 F (36.4 C), temperature source Oral, resp. rate 16, height '5\' 11"'$  (1.803 m), weight 203 lb 0.7 oz (92.1 kg), SpO2 99 %.  General: Ill appearing  male in no acute distress Head: Eyes PERRLA, No xanthomas. Normocephalic and atraumatic, oropharynx without edema or exudate. + hemotysis Lungs: Resp regular and unlabored. Diminished breath sound at bases.  Heart: RRR no s3, s4, or murmurs..   Neck: No carotid bruits. No lymphadenopathy. No  JVD. Abdomen: Bowel sounds present, abdomen soft and non-tender without masses or hernias noted. Msk:  No spine or cva tenderness. No weakness, no joint deformities or effusions. Extremities: No clubbing, cyanosis or edema. DP/PT/Radials 2+ and equal bilaterally. Neuro: Alert and oriented X 3. No focal deficits noted. Psych:  Good affect, responds appropriately Skin: No rashes or lesions noted.  Labs:   Lab Results  Component Value Date   WBC 14.4* 07/31/2015   HGB 9.8* 07/31/2015   HCT 30.6* 07/31/2015   MCV 84.5 07/31/2015   PLT 154 07/31/2015    Recent Labs  07/31/15 0242  INR 1.34    Recent Labs Lab 07/28/15 2141  07/31/15 0242  NA 133*  < > 134*  K 4.4  < > 3.7  CL 94*  < > 99*  CO2  23  < > 24  BUN 40*  < > 10  CREATININE 2.48*  < > 0.90  CALCIUM 9.1  < > 9.0  PROT 7.0  --   --  BILITOT 0.7  --   --   ALKPHOS 68  --   --   ALT 21  --   --   AST 37  --   --   GLUCOSE 74  < > 158*  ALBUMIN 3.3*  --   --   < > = values in this interval not displayed. MAGNESIUM  Date Value Ref Range Status  01/15/2008 2.1  Final    Recent Labs  07/29/15 2105 07/30/15 2142 07/31/15 0242 07/31/15 0935  TROPONINI <0.03 <0.03 0.03 0.04*   No results for input(s): TROPIPOC in the last 72 hours. No results found for: PROBNP Lab Results  Component Value Date   CHOL  01/15/2008    196        ATP III CLASSIFICATION:  <200     mg/dL   Desirable  200-239  mg/dL   Borderline High  >=240    mg/dL   High   HDL 27* 01/15/2008   LDLCALC * 01/15/2008    112        Total Cholesterol/HDL:CHD Risk Coronary Heart Disease Risk Table                     Men   Women  1/2 Average Risk   3.4   3.3   TRIG 286* 01/15/2008   No results found for: DDIMER LIPASE  Date/Time Value Ref Range Status  07/28/2015 09:41 PM 18 11 - 51 U/L Final   TSH  Date/Time Value Ref Range Status  02/14/2014 08:08 PM 3.350 0.350 - 4.500 uIU/mL Final    Comment:    Performed at [facility]  Date/Time Value Ref Range Status  07/23/2015 10:41 PM 604 180 - 914 pg/mL Final    Comment:    (NOTE) This assay is not validated for testing neonatal or myeloproliferative syndrome specimens for Vitamin B12 levels. Performed at [facility]  Date/Time Value Ref Range Status  07/24/2015 03:34 AM 37.7 >5.9 ng/mL Final    Comment:    Performed at [facility]  Date/Time Value Ref Range Status  07/23/2015 10:41 PM 81 24 - 336 ng/mL Final    Comment:    Performed at [facility]   TIBC  Date/Time Value Ref Range Status  07/23/2015 10:41 PM 372 250 - 450 ug/dL Final   IRON  Date/Time Value Ref Range Status  07/23/2015 10:41 PM 38* 45 - 182  ug/dL Final   RETIC CT PCT  Date/Time Value Ref Range Status  07/24/2015 03:34 AM 2.3 0.4 - 3.1 % Final    Cath 12/24/2010 ANGIOGRAPHY:  Left main. The left main is fairly normal.  The left anterior descending artery has only minor luminal irregularities. The flow down the LAD is somewhat sluggish, but there are no critical lesions to contribute to this. I suspect that there is some sludging because of the high hematocrit. His first diagonal artery has a 40-50% stenosis at its origin.  The left circumflex artery is large and dominant. It gives off a large first obtuse marginal artery which is normal. The distal circumflex is unremarkable. The posterior descending artery and the posterolateral segment artery are normal.  The ramus intermediate vessel has minor luminal irregularities.  The right coronary artery is small and is nondominant. There are minor to moderate irregularities.  The RV marginal branch is normal.  The left ventriculogram was performed in a 30-RAO  position and reveals normal left ventricular systolic function. Ejection fraction is approximately 60-65%.  The distal aortogram was performed because of some difficulty in getting up into the central circulation. 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 This stenosis does not appear to be critical at this time.  COMPLICATIONS: None.  CONCLUSIONS: 1. Minor coronary artery irregularities. 2. Normal left ventricular systolic function. 3. He has sluggish flow which is particularly visible down the left  anterior descending. I think that this is possibly due to his  elevated hematocrit and possible sludging. There are no lesions to  address using percutaneous coronary intervention. The patient  definitely needs to quit smoking, so that his hemoglobin will come  down towards normal. He may need a phlebotomy.    Echo: 12/2010 LV ef of  55-60%  ECG:  afib at rate of 152bpm, non specific ST changes  Radiology:  No results found.  ASSESSMENT AND PLAN:     1. Atrial fibrillation with RVR ([place]) - New onset in setting of pneumonia and hemoptysis. Going back and forth. Currently maintaining sinus rhythm since ~10am. Discontinued Cardizem. Currently on IV amiodarone. CHADVACS score of 3 (HTN, vascular disease & DM). He also has non obstructive CAD and PAD 70% right iliac. Not a candidate for anticoagulation in setting of acute hemoptysis> concerning for malignancy.   2. CAD - Cath 12/24/2010 showed mild to moderate atherosclerosis with slow flow noted in the LAD. It was suggested that this could be related to his relative erythrocytosis (likely secondary to smoking), though certainly with his risk factor profile, endothelial dysfunction and microvascular dysfunction are just as likely.  His first diagonal artery has a 40-50% stenosis at its origin. - The patient does have substernal chest pain describes as "elephant sitting/ stabbing" when he goes into afib with RVR. Troponin trend 0.03-->0.03-->0.04.  - EKG on admission showed NSR at rate of 77 bpm, non specific ST elevation in inferior leads.    SignedLeanor Kail, [place] 07/31/2015, 2:16 PM Pager [phone]  Co-Sign MD  History and all data above reviewed.  Patient examined.  I agree with the findings as above.  The patient is admitted with pulmonary disease as listed above.  He has had intermittent atrial fib.  Currently on IV amiodarone with NSR.  He does not feel his rapid rate.  He has chest pain that is constant and atypical for cardiac pain and likely related to his acute pulmonary process.  The patient exam reveals COR:RRR  ,  Lungs: Decreased breath sounds with course crackles ,  Abd: Positive bowel sounds, no rebound no guarding, Ext No edema  .  All available labs, radiology testing, previous records reviewed. Agree with documented assessment and plan. Atrial fib:  Not  surprising that we see this rhythm given his acute pulmonary process.  For now continue IV amiodarone.  Not an anticoagulation candidate.   Jeneen Rinks Koya Hunger  5:20 PM  07/31/2015

## 2015-07-31 NOTE — Evaluation (Signed)
Occupational Therapy Evaluation Patient Details Name: [name] MRN: [id number] DOB: 03-Oct-1950 Today's Date: 07/31/2015    History of Present Illness64 y.o. male admitted to [facility] on 07/[facility]/15 with chest pain and hemoptysis. Dx with acute kidney injury, hemoptysis (will undergo bronchoscopy with possible biopsy 07/31/15), HCAP, and pleuritis chest pain. Pt with significant PMhx of collar bone fx ~6 weeks prior to admission, essential HTN, DM2, Stevens-Johnson syndrome, PAD, neuropathy, neck surgery, back surgery, and basal cell carcinoma (2010).     Clinical Impression  PT admitted with see above and pending workup for lung mass. Pt currently with functional limitiations due to the deficits listed below (see OT problem list). PTA was independent with all adls.  Pt will benefit from skilled OT to increase their independence and safety with adls and balance to allow discharge [place].    Follow Up Recommendations  Home health OT;Supervision/Assistance - 24 hour    Equipment Recommendations  Tub/shower seat    Recommendations for Other Services       Precautions / Restrictions Precautions Precautions: Other (comment);Fall Precaution Comments: monitor O2 sats      Mobility Bed Mobility Overal bed mobility: Modified Independent                Transfers                 General transfer comment: not assessed Pt declined    Balance                                            ADL Overall ADL's : Needs assistance/impaired                                       General ADL Comments: Pt reports any bending causes SOB and inability to complete task at this time. pt able to complete knee flexion but not reach bil LE. pt educated on possible need for reacher. pt has wife present and wife can (A) pt PRN. pt educated in detail energy conservation and wife present. pt agreeable to shower seat at this time. pt declined OOB due to production  of blood with coughing. pt with basin present with large amount of blood. pt reports x4 days of coughing up blood. pt required mod (A) to don glasses due to weakness in BIL UE     Vision     Perception     Praxis      Pertinent Vitals/Pain Pain Assessment: Faces Faces Pain Scale: Hurts whole lot Pain Location: all over pain Pain Descriptors / Indicators: Discomfort;Constant Pain Intervention(s): Monitored during session;Repositioned     Hand Dominance Right   Extremity/Trunk Assessment Upper Extremity Assessment Upper Extremity Assessment: Generalized weakness   Lower Extremity Assessment Lower Extremity Assessment: Generalized weakness   Cervical / Trunk Assessment Cervical / Trunk Exceptions: history of low back and neck surgery   Communication Communication Communication: No difficulties   Cognition Arousal/Alertness: Awake/alert Behavior During Therapy: WFL for tasks assessed/performed Overall Cognitive Status: Within Functional Limits for tasks assessed                     General Comments       Exercises       Shoulder Instructions      Home Living Family/patient expects  to be discharged to:: Private residence Living Arrangements: Spouse/significant other Available Help at Discharge: Family;Available 24 hours/day Type of Home: House Home Access: Stairs to enter [employer] of Steps: 2 Entrance Stairs-Rails: None Home Layout: One level     Bathroom Shower/Tub: Teacher, early years/pre: Standard     Home Equipment: None          Prior Functioning/Environment Level of Independence: Independent        Comments: Has never used home O2    OT Diagnosis: Generalized weakness;Acute pain   OT Problem List: Decreased strength;Decreased activity tolerance;Impaired balance (sitting and/or standing);Decreased safety awareness;Decreased knowledge of use of DME or AE;Decreased knowledge of precautions;Cardiopulmonary  status limiting activity;Pain   OT Treatment/Interventions: Self-care/ADL training;Therapeutic exercise;Energy conservation;DME and/or AE instruction;Therapeutic activities;Cognitive remediation/compensation;Patient/family education;Balance training    OT Goals(Current goals can be found in the care plan section) Acute Rehab OT Goals Patient Stated Goal: to feel better OT Goal Formulation: With patient/family Time For Goal Achievement: 2015/09/10 Potential to Achieve Goals: Good  OT Frequency: Min 2X/week   Barriers to D/C:            Co-evaluation              End of Session Equipment Utilized During Treatment: Oxygen Nurse Communication: Mobility status;Precautions  Activity Tolerance: Patient limited by pain Patient left: in bed;with call bell/phone within reach;with bed alarm set   Time: 1209-1224 OT Time Calculation (min): 15 min Charges:  OT General Charges $OT Visit: 1 Procedure OT Evaluation $OT Eval Moderate Complexity: 1 Procedure G-Codes:    Parke Poisson B Aug 27, 2015, 2:44 PM  Jeri Modena   OTR/L Pager: [phone] Office: [phone] .

## 2015-07-31 NOTE — Progress Notes (Signed)
Name: DESHANE COTRONEO MRN: [id number] DOB: May 27, 1950    ADMISSION DATE:  07/28/2015 CONSULTATION DATE: 07/29/15  REFERRING MD :  Burnett Corrente  CHIEF COMPLAINT:  hemoptysis  BRIEF PATIENT DESCRIPTION: 65 yo male with Hx of HTN, Type 2 DM, OSA,tobacco abuse, prior basal cell carcinoma(2010) was admitted to [facility] from April 5 -8 for CAP and left upper lung mass. Was treated with Levaquin and flagyl and was transitioned to oral Augmentin. Patient now presents with increased cough production and  Worsening Hemoptysis.  He was supposed to undergo fiberoptic bronchoscopy with Dr. Lamonte Sakai  But not yet had a biopsy of the lung mass.  SIGNIFICANT EVENTS  4/10> Readmitted  With HCAP and Hemoptysis   4/11 Developed Afib with RVR  STUDIES:   4/5 >>CT chest was concerning for Extensive masslike nodular opacities in the left lower lobe, with a confluent infrahilar mass measuring 3.2 x 3.2 cm, many of whichare contiguous with left hilar soft tissue density. There isextensive multifocal mediastinal adenopathy. Findings consistentwith malignancy, with primary likely bronchogenic in the left lowerlobe. These masslike opacities encase the left lower lobe pulmonary artery leads luminal narrowing but no discrete invasion or frankpulmonary embolus. Multiple ground-glass opacities in the left lowerlobe, may be pneumonitis, postobstructive atelectasis or spread of malignancy.2. Additional lingular nodule measures 1.5 cm.3. Bubbly density in the left mainstem bronchus and left lower lobe,with left lung bronchial thickening. This is likely secretions or aspiration.4. Moderate emphysema.    SUBJECTIVE:  In SDU for Afi with RVR  VITAL SIGNS: Temp:  [97.6 F (36.4 C)-98.3 F (36.8 C)] 98.3 F (36.8 C) (04/12 0800) Pulse Rate:  [50-156] 135 (04/12 1000) Resp:  [13-24] 13 (04/12 1000) BP: (99-170)/(38-87) 131/75 mmHg (04/12 1000) SpO2:  [88 %-100 %] 97 % (04/12 1000)  PHYSICAL EXAMINATION: General:  Sickly  appearing , elderly white male Neuro:  Awake, alert, oriented,  HEENT:  HOH, atraumatic, normocephalic,no disharge Cardiovascular:  Afib with VR 123 Lungs: Diminished bases, no wheezes, crackles, rhonchi, + hemoptysis  Abdomen: soft, nontender, BS+ Musculoskeletal:  No deformity or inflamation noted Skin:  Grossly intact and dry   Recent Labs Lab 07/29/15 0518 07/30/15 0654 07/31/15 0242  NA 134* 134* 134*  K 4.5 4.1 3.7  CL 97* 99* 99*  CO2 '22 22 24  '$ BUN 44* 27* 10  CREATININE 2.26* 1.28* 0.90  GLUCOSE 94 182* 158*    Recent Labs Lab 07/29/15 0518 07/30/15 0654 07/31/15 0242  HGB 10.1* 10.1* 9.8*  HCT 31.3* 31.6* 30.6*  WBC 14.6* 15.3* 14.4*  PLT 159 161 154   Nm Pulmonary Perf And Vent  07/29/2015  CLINICAL DATA:  Patient with postsurgical chest pain and hypoxia. Patient could not elevate her arms. EXAM: NUCLEAR MEDICINE VENTILATION - PERFUSION LUNG SCAN TECHNIQUE: Ventilation images were obtained in multiple projections using inhaled aerosol Tc-32mDTPA. Perfusion images were obtained in multiple projections after intravenous injection of Tc-962mAA. RADIOPHARMACEUTICALS:  31.1 mCi Technetium-9965mPA aerosol inhalation and 4.2 mCi Technetium-64m47m IV COMPARISON:  Chest radiograph, 07/28/2015 FINDINGS: Ventilation: Decreased ventilation to left lung base corresponding to the area of airspace consolidation as well as overall decreased activity in the left lung when compared to the right. Perfusion: Normal profusion to the right lung. Decreased profusion seen throughout the left lung with no discrete segmental defect. IMPRESSION: 1. Decreased profusion to the entire left lung. There is also decreased ventilation to the left lung and more discrete ventilatory defect the left lung base where there is consolidation  on the current chest radiograph. This is an intermediate probability study for pulmonary thromboembolism based on modified prior PET criteria. Consider followup CTA of  the chest if this patient can tolerate that procedure. Electronically Signed   By: Lajean Manes M.D.   On: 07/29/2015 12:08    ASSESSMENT / PLAN: A  HCAP Hemoptysis Left  Upper lobe lung mass Hx of tobaco abuse, OSA  P Continue Zyvox/ zosyn Fiberoptic bronchoscopy and biopsy  for evaluation of lung mass on 4/12 threfore NPO after midnight on 4/11 INR on  prior to procedure added guaifenesin dextromethorphan Follow cultures Continue CPAP as needed CXR in am Trend CBC in am Continue Duoneb/ xopnex Follow Duplex of lower extremities Cancel FOB planned for 4/12 till stable  A   New onset Afib with RVR 4/11 and moved to SDU  P Control rate with Cardizem / Lopressor(note he was on ACE-I as opt. No anticoagulation in setting of hemoptysis DC nicotine patch in afib Needs cards consult   Richardson Landry Minor ACNP Maryanna Shape PCCM Pager [phone] till 3 pm If no answer page [phone] 07/31/2015, 10:58 AM  Attending Note:  65 year old male with tobacco abuse history presenting with hemoptysis and overnight developed a-fib with RVR that is paroxysmal.  Hemoptysis persists so no anticoagulation.  On exam, hemoptysis persists with coarse BS diffusely.  I reviewed chest CT myself, masses noted.  Discussed with PCCM-NP and bedside RN.  Hemoptysis:  - Monitor.  - No anticoagulation.  - Bronch in AM if cardiac rhythm is stable.  A-fib, paroxysmal:  - NO anticoagulation.  - Amiodarone drip to prevent paroxysm since we can not anti-coag and high risk of CVA.  - Recommend cardiology consult.  Lungs mass:  - Plan bronch in AM.  - NPO after midnight.  Tobacco abuse:  - Smoking cessation.  Patient seen and examined, agree with above note.  I dictated the care and orders written for this patient under my direction.  Rush Farmer, MD [phone]

## 2015-07-31 NOTE — Progress Notes (Addendum)
[facility] PROGRESS NOTE  [name] GUR:[id number] DOB: [birth year] DOA: 07/28/2015 PCP: Fransisca Kaufmann Dettinger, MD  Assessment/Plan:  Acute kidney injury. Primary reason for admission. Patient was recently discharged couple days ago with a creatinine of 1.08. Patient reportedly had decreased appetite and a 10 pound weight loss. Suspect these are not pending drinking adequately. Continue with IV fluids.   Atrial fibrillation - New problem, placed on cardizem.  Unable to anticoagulate due to active bleeding (hemoptysis) had fresh blood clumps coughed up this morning. - consulted cardiology per Pulmonary team request   Hemoptysis. Chest pain  Patient was seen by Dr. Lamonte Sakai of critical care last hospitalization, he continues to have hemoptysis and was supposed to undergo fiberoptic bronchoscopy   - Pt to undergo the biopsy for an official diagnosis. - CCM consulted.  Discussed with Pulmonary PA, Per attending no need for anticoagulation , VQ scan likely reflect lung mass.  - hgb steady on last check.   HCAP. Patient recently admitted to hospital, he was placed on Levaquin and Flagyl can transition to oral Augmentin. Chest x-ray appears to be worse than last day.  - sputum culture, streptococcal and urine Legionella antigens pending -Continue with  Zosyn 3.375 g 3 times a day.     Pleuritic pain. Suspect secondary to the left upper lobe lung mass and left lower lobe pneumonia. Point was negative, EKG without evidence of ST elevation or depression. For now we'll continue to monitor on telemetry.    Iron deficiency anemia. Hemoglobin appears to be relatively stable.    Recent cervical collar fracture occurred about 6 weeks ago. Continues on oxycodone 30 mg twice a day and Percocet 5-3 25 every 6 hours when necessary.    Obstructive sleep apnea. CPAP daily at bedtime   Hyperlipidemia.  Continue atorvastatin 20 mg daily   Depression/anxiety. continue Wellbutrin 150 mg daily, Will add  Ativan  Tobacco abuse. Topical NicoDerm 21 mg, smoking cessation advised  11. GERD.  Protonix 40 mg daily  12. DVT prophylaxis. SCDs given the hemoptysis  Code Status: DNR Family Communication: Wife at bedside.  Disposition Plan: Remain inpatient.    Consultants:  Pulmonary   Procedures:  none  Antibiotics:  Zosyn 4-10    HPI/Subjective: No new complaints reported.  Objective: Filed Vitals:   07/31/15 0930 07/31/15 1000  BP:  131/75  Pulse: 102 135  Temp:    Resp: 17 13    Intake/Output Summary (Last 24 hours) at 07/31/15 1013 Last data filed at 07/31/15 0700  Gross per 24 hour  Intake 3786.13 ml  Output   1950 ml  Net 1836.13 ml   Filed Weights   07/28/15 2058 07/29/15 0235  Weight: 91.4 kg (201 lb 8 oz) 92.1 kg (203 lb 0.7 oz)    Exam:   General:  NAD, alert and awake  Cardiovascular: S 1, S 2 RRR  Respiratory: rhales, no wheezes, equal chest rise.  Abdomen: BS present, soft, nt  Musculoskeletal: no edema.   Data Reviewed: Basic Metabolic Panel:  Recent Labs Lab 07/25/15 0504 07/28/15 2141 07/29/15 0518 07/30/15 0654 07/31/15 0242  NA 135 133* 134* 134* 134*  K 4.0 4.4 4.5 4.1 3.7  CL 97* 94* 97* 99* 99*  CO2 '24 23 22 22 24  '$ GLUCOSE 175* 74 94 182* 158*  BUN 44* 40* 44* 27* 10  CREATININE 1.08 2.48* 2.26* 1.28* 0.90  CALCIUM 9.3 9.1 8.5* 9.1 9.0   Liver Function Tests:  Recent Labs Lab 07/28/15 2141  AST 37  ALT 21  ALKPHOS 68  BILITOT 0.7  PROT 7.0  ALBUMIN 3.3*    Recent Labs Lab 07/28/15 2141  LIPASE 18   No results for input(s): AMMONIA in the last 168 hours. CBC:  Recent Labs Lab 07/25/15 0504 07/28/15 2141 07/29/15 0518 07/30/15 0654 07/31/15 0242  WBC 13.2* 17.9* 14.6* 15.3* 14.4*  NEUTROABS  --  13.9* 11.0*  --  12.4*  HGB 11.5* 10.5* 10.1* 10.1* 9.8*  HCT 35.1* 32.6* 31.3* 31.6* 30.6*  MCV 86.0 85.1 85.1 86.6 84.5  PLT 238 177 159 161 154   Cardiac Enzymes:  Recent Labs Lab  07/29/15 1440 07/29/15 1714 07/29/15 2105 07/30/15 2142 07/31/15 0242  TROPONINI <0.03 0.06* <0.03 <0.03 0.03   BNP (last 3 results)  Recent Labs  07/23/15 2241  BNP 12.0    ProBNP (last 3 results) No results for input(s): PROBNP in the last 8760 hours.  CBG:  Recent Labs Lab 07/30/15 1730 07/30/15 2105 07/30/15 2208 07/31/15 0227 07/31/15 0747  GLUCAP 150* 185* 199* 150* 149*    Recent Results (from the past 240 hour(s))  MRSA PCR Screening     Status: None   Collection Time: 07/24/15  2:55 AM  Result Value Ref Range Status   MRSA by PCR NEGATIVE NEGATIVE Final    Comment:        The GeneXpert MRSA Assay (FDA approved for NASAL specimens only), is one component of a comprehensive MRSA colonization surveillance program. It is not intended to diagnose MRSA infection nor to guide or monitor treatment for MRSA infections.   Culture, blood (routine x 2) Call MD if unable to obtain prior to antibiotics being given     Status: None (Preliminary result)   Collection Time: 07/29/15  1:37 AM  Result Value Ref Range Status   Specimen Description BLOOD RIGHT ARM  Final   Special Requests BOTTLES DRAWN AEROBIC ONLY 5CC  Final   Culture NO GROWTH 1 DAY  Final   Report Status PENDING  Incomplete  Culture, blood (routine x 2) Call MD if unable to obtain prior to antibiotics being given     Status: None (Preliminary result)   Collection Time: 07/29/15  1:42 AM  Result Value Ref Range Status   Specimen Description BLOOD RIGHT ARM  Final   Special Requests BOTTLES DRAWN AEROBIC ONLY 5CC  Final   Culture NO GROWTH 1 DAY  Final   Report Status PENDING  Incomplete  Culture, sputum-assessment     Status: None   Collection Time: 07/29/15  7:34 AM  Result Value Ref Range Status   Specimen Description SPUTUM  Final   Special Requests NONE  Final   Sputum evaluation   Final    THIS SPECIMEN IS ACCEPTABLE. RESPIRATORY CULTURE REPORT TO FOLLOW.   Report Status 07/29/2015 FINAL   Final  Culture, respiratory (NON-Expectorated)     Status: None   Collection Time: 07/29/15  8:20 AM  Result Value Ref Range Status   Specimen Description SPUTUM  Final   Special Requests NONE  Final   Gram Stain   Final    ABUNDANT WBC PRESENT,BOTH PMN AND MONONUCLEAR FEW SQUAMOUS EPITHELIAL CELLS PRESENT NO ORGANISMS SEEN Performed at [facility]    Culture   Final    FEW CANDIDA ALBICANS Performed at [facility]    Report Status 07/31/2015 FINAL  Final     Studies: Nm Pulmonary Perf And Vent  07/29/2015  CLINICAL DATA:  Patient with  postsurgical chest pain and hypoxia. Patient could not elevate her arms. EXAM: NUCLEAR MEDICINE VENTILATION - PERFUSION LUNG SCAN TECHNIQUE: Ventilation images were obtained in multiple projections using inhaled aerosol Tc-72mDTPA. Perfusion images were obtained in multiple projections after intravenous injection of Tc-971mAA. RADIOPHARMACEUTICALS:  31.1 mCi Technetium-9996mPA aerosol inhalation and 4.2 mCi Technetium-7m47m IV COMPARISON:  Chest radiograph, 07/28/2015 FINDINGS: Ventilation: Decreased ventilation to left lung base corresponding to the area of airspace consolidation as well as overall decreased activity in the left lung when compared to the right. Perfusion: Normal profusion to the right lung. Decreased profusion seen throughout the left lung with no discrete segmental defect. IMPRESSION: 1. Decreased profusion to the entire left lung. There is also decreased ventilation to the left lung and more discrete ventilatory defect the left lung base where there is consolidation on the current chest radiograph. This is an intermediate probability study for pulmonary thromboembolism based on modified prior PET criteria. Consider followup CTA of the chest if this patient can tolerate that procedure. Electronically Signed   By: DaviLajean Manes.   On: 07/29/2015 12:08    Scheduled Meds: . antiseptic oral rinse  7 mL Mouth Rinse BID   . atorvastatin  20 mg Oral Daily  . buPROPion  150 mg Oral Daily  . fluticasone furoate-vilanterol  1 puff Inhalation Daily  . gabapentin  600 mg Oral BID  . insulin aspart  0-20 Units Subcutaneous TID WC  . insulin aspart  0-5 Units Subcutaneous QHS  . ipratropium-albuterol  3 mL Nebulization TID  . linezolid (ZYVOX) IV  600 mg Intravenous Q12H  . nicotine  21 mg Transdermal Daily  . nitroGLYCERIN  0.5 inch Topical 3 times per day  . oxyCODONE  30 mg Oral TID  . pantoprazole  40 mg Oral Daily  . piperacillin-tazobactam (ZOSYN)  IV  3.375 g Intravenous 3 times per day   Continuous Infusions: . sodium chloride 100 mL/hr at 07/31/15 0511  . diltiazem (CARDIZEM) infusion 15 mg/hr (07/31/15 1000)    Active Problems:   Hemoptysis   AKI (acute kidney injury) (HCC)Sac Time spent: 25 minutes.     [facility] Pager [phone] 7PM-7AM, please contact night-coverage at [email or web address], password [email or web address]2/2017, 10:13 AM  LOS: 2 days

## 2015-07-31 NOTE — Significant Event (Signed)
Patient is back into A Fib, rates up to 140s. Increased cardizem. MD Vega at bedside.

## 2015-08-01 ENCOUNTER — Ambulatory Visit: Payer: Medicare Other | Admitting: Family Medicine

## 2015-08-01 ENCOUNTER — Telehealth: Payer: Self-pay | Admitting: Orthopaedic Surgery

## 2015-08-01 ENCOUNTER — Telehealth: Payer: Self-pay | Admitting: Family Medicine

## 2015-08-01 ENCOUNTER — Encounter (HOSPITAL_COMMUNITY): Payer: Medicare Other

## 2015-08-01 DIAGNOSIS — R0602 Shortness of breath: Secondary | ICD-10-CM

## 2015-08-01 LAB — GLUCOSE, CAPILLARY
GLUCOSE-CAPILLARY: 106 mg/dL — AB (ref 65–99)
Glucose-Capillary: 125 mg/dL — ABNORMAL HIGH (ref 65–99)
Glucose-Capillary: 104 mg/dL — ABNORMAL HIGH (ref 65–99)
Glucose-Capillary: 216 mg/dL — ABNORMAL HIGH (ref 65–99)

## 2015-08-01 LAB — CBC
HEMATOCRIT: 28.2 % — AB (ref 39.0–52.0)
HEMOGLOBIN: 9.4 g/dL — AB (ref 13.0–17.0)
MCH: 28.5 pg (ref 26.0–34.0)
MCHC: 33.3 g/dL (ref 30.0–36.0)
MCV: 85.5 fL (ref 78.0–100.0)
Platelets: 155 10*3/uL (ref 150–400)
RBC: 3.3 MIL/uL — ABNORMAL LOW (ref 4.22–5.81)
RDW: 14.4 % (ref 11.5–15.5)
WBC: 13.3 10*3/uL — AB (ref 4.0–10.5)

## 2015-08-01 LAB — LEGIONELLA PNEUMOPHILA SEROGP 1 UR AG: L. pneumophila Serogp 1 Ur Ag: NEGATIVE

## 2015-08-01 NOTE — Care Management Important Message (Signed)
Important Message  Patient Details  Name: [name] MRN: [id number] Date of Birth: [birth year]   Medicare Important Message Given:  Yes    [name] 08/01/2015, 12:49 PM

## 2015-08-01 NOTE — Progress Notes (Signed)
No show

## 2015-08-01 NOTE — Progress Notes (Signed)
Name: [name] MRN: [id number] DOB: 12-04-1950    ADMISSION DATE:  07/28/2015 CONSULTATION DATE: 07/29/15  REFERRING MD :  Burnett Corrente  CHIEF COMPLAINT:  hemoptysis  BRIEF PATIENT DESCRIPTION: 65 yo male with Hx of HTN, Type 2 DM, OSA,tobacco abuse, prior basal cell carcinoma(2010) was admitted to [facility] from April 5 -8 for CAP and left upper lung mass. Was treated with Levaquin and flagyl and was transitioned to oral Augmentin. Patient now presents with increased cough production and  Worsening Hemoptysis.  He was supposed to undergo fiberoptic bronchoscopy with Dr. Lamonte Sakai  But not yet had a biopsy of the lung mass.  SIGNIFICANT EVENTS  4/10> Readmitted  With HCAP and Hemoptysis   4/11 Developed Afib with RVR  STUDIES:   4/5 >>CT chest was concerning for Extensive masslike nodular opacities in the left lower lobe, with a confluent infrahilar mass measuring 3.2 x 3.2 cm, many of whichare contiguous with left hilar soft tissue density. There isextensive multifocal mediastinal adenopathy. Findings consistentwith malignancy, with primary likely bronchogenic in the left lowerlobe. These masslike opacities encase the left lower lobe pulmonary artery leads luminal narrowing but no discrete invasion or frankpulmonary embolus. Multiple ground-glass opacities in the left lowerlobe, may be pneumonitis, postobstructive atelectasis or spread of malignancy.2. Additional lingular nodule measures 1.5 cm.3. Bubbly density in the left mainstem bronchus and left lower lobe,with left lung bronchial thickening. This is likely secretions or aspiration.4. Moderate emphysema.    SUBJECTIVE:  In SDU for Afi with RVR  VITAL SIGNS: Temp:  [97.6 F (36.4 C)-98.7 F (37.1 C)] 98.7 F (37.1 C) (04/13 0743) Pulse Rate:  [81-113] 102 (04/13 1000) Resp:  [12-22] 14 (04/13 1000) BP: (97-183)/(41-129) 162/86 mmHg (04/13 1000) SpO2:  [91 %-100 %] 98 % (04/13 1000)  PHYSICAL EXAMINATION: General:  Sickly  appearing , elderly white male Neuro:  Awake, alert, oriented,  HEENT:  HOH, atraumatic, normocephalic,no disharge Cardiovascular:  Afib with VR 123 Lungs: Diminished bases, no wheezes, crackles, rhonchi, + hemoptysis  Abdomen: soft, nontender, BS+ Musculoskeletal:  No deformity or inflamation noted Skin:  Grossly intact and dry   Recent Labs Lab 07/29/15 0518 07/30/15 0654 07/31/15 0242  NA 134* 134* 134*  K 4.5 4.1 3.7  CL 97* 99* 99*  CO2 '22 22 24  '$ BUN 44* 27* 10  CREATININE 2.26* 1.28* 0.90  GLUCOSE 94 182* 158*    Recent Labs Lab 07/30/15 0654 07/31/15 0242 07/31/15 2355  HGB 10.1* 9.8* 9.4*  HCT 31.6* 30.6* 28.2*  WBC 15.3* 14.4* 13.3*  PLT 161 154 155   No results found.  ASSESSMENT / PLAN: A  HCAP Hemoptysis Left  Upper lobe lung mass Hx of tobaco abuse, OSA  P Continue Zyvox/ zosyn Fiberoptic bronchoscopy and biopsy  for evaluation of lung mass plan bronch next week. INR on  prior to procedure added guaifenesin dextromethorphan Follow cultures Continue CPAP as needed CXR in am Trend CBC in am Continue Duoneb/ xopnex Follow Duplex of lower extremities Cancel FOB planned for 4/12 till stable  A   New onset Afib with RVR 4/11 and moved to SDU  P Control rate with Cardizem / Lopressor(note he was on ACE-I as opt. No anticoagulation in setting of hemoptysis DC nicotine patch in afib Needs cards consult  65 year old male with tobacco abuse history presenting with hemoptysis and overnight developed a-fib with RVR that is paroxysmal.  Hemoptysis persists so no anticoagulation.  On exam, hemoptysis persists with coarse BS diffusely.  I reviewed chest CT myself, masses noted.  Discussed with PCCM-NP and bedside RN.  Hemoptysis:  - Monitor.  - No anticoagulation.  - Patient wanted to wait on bronch til next week.  - Will re-evaluate on Monday.  A-fib, paroxysmal:  - NO anticoagulation.  - Amiodarone drip to prevent paroxysm since we can not  anti-coag and high risk of CVA.  - Recommend cardiology consult.  Lungs mass:  - Resume diet.  - Bronch next week.  Tobacco abuse:  - Smoking cessation.  PCCM will see again on Monday.  Rush Farmer, M.D. [facility] Pulmonary/Critical Care Medicine. Pager: [phone]. After hours pager: [phone].

## 2015-08-01 NOTE — Progress Notes (Signed)
Physical Therapy Treatment Patient Details Name: [name] MRN: [id number] DOB: February 19, 1951 Today's Date: 08/01/2015    History of Present Illness 65 y.o. male admitted to [facility] on 07/28/15 with chest pain and hemoptysis.  Dx with acute kidney injury, hemoptysis (will undergo bronchoscopy with possible biopsy 07/31/15), HCAP, and pleuritis chest pain.  Pt with significant PMhx of collar bone fx ~6 weeks prior to admission, essential HTN, DM2, Stevens-Johnson syndrome, PAD, neuropathy, neck surgery, back surgery, and basal cell carcinoma (2010).      PT Comments    Patient seen for mobility progression, patient eager to mobilize but upon coming to EOB and standing, patient continued to have elevated BP 568L systolic and felt extremely anxious and short of breath. Assisted patient back to bed, nsg to room. Saturations remained >94% throughout, but BP in all positions >275 systolic. Will hold ambulation at this time. Educated patient on pursed lip breathing with teachback method and assisted with repositioning in modified chair position in bed. Will continue to follow.   Follow Up Recommendations  Home health PT;Supervision/Assistance - 24 hour     Equipment Recommendations  Rolling walker with 5" wheels;Other (comment) (rollator- 4 wheels and a seat)    Recommendations for Other Services       Precautions / Restrictions Precautions Precautions: Other (comment);Fall Precaution Comments: monitor O2 sats Restrictions Weight Bearing Restrictions: No    Mobility  Bed Mobility Overal bed mobility: Modified Independent             General bed mobility comments: increased time to perform, no physical assist required  Transfers Overall transfer level: Needs assistance Equipment used: None Transfers: Sit to/from Stand Sit to Stand: Supervision         General transfer comment: No physical assist required, able to come to standing and maintain without assist    Ambulation/Gait                 Stairs            Wheelchair Mobility    Modified Rankin (Stroke Patients Only)       Balance   Sitting-balance support: Feet supported Sitting balance-Leahy Scale: Good     Standing balance support: No upper extremity supported Standing balance-Leahy Scale: Fair                      Cognition Arousal/Alertness: Awake/alert Behavior During Therapy: Anxious Overall Cognitive Status: Within Functional Limits for tasks assessed                      Exercises      General Comments        Pertinent Vitals/Pain Pain Assessment: Faces Faces Pain Scale: Hurts little more Pain Location: generally all over Pain Descriptors / Indicators: Discomfort Pain Intervention(s): Monitored during session;Repositioned    Home Living                      Prior Function            PT Goals (current goals can now be found in the care plan section) Acute Rehab PT Goals Patient Stated Goal: to feel better PT Goal Formulation: With patient/family Time For Goal Achievement: 08/13/15 Potential to Achieve Goals: Good Progress towards PT goals: Not progressing toward goals - comment (limited by elevated BP and chest tightness)    Frequency  Min 3X/week    PT Plan Current plan remains appropriate  Co-evaluation             End of Session Equipment Utilized During Treatment: Oxygen Activity Tolerance: Patient limited by fatigue;Other (comment) (limited by pulmonary tolerance) Patient left: in bed;with call bell/phone within reach;with nursing/sitter in room     Time: 1456-1511 PT Time Calculation (min) (ACUTE ONLY): 15 min  Charges:  $Therapeutic Activity: 8-22 mins                    G CodesDuncan Dull 2015/08/28, 4:47 PM Alben Deeds, [place] DPT  [phone]

## 2015-08-01 NOTE — Progress Notes (Signed)
Nutrition Follow-up  DOCUMENTATION CODES:   Not applicable  INTERVENTION:   -Continue with Carb Modified diet -Encourage PO intake  NUTRITION DIAGNOSIS:   Inadequate oral intake related to altered GI function as evidenced by per patient/family report.  Progressing  GOAL:   Patient will meet greater than or equal to 90% of their needs  Progressing  MONITOR:   Diet advancement, Labs, Weight trends, Skin, I & O's  REASON FOR ASSESSMENT:   Consult Assessment of nutrition requirement/status  ASSESSMENT:   [name] is a 65 y.o. male past medical history significant for chronic tobacco abuse by 1-2 cigarettes daily, prior basal cell carcinoma diagnosed in 2010, obstructive sleep apnea on CPAP daily at bedtime, esophageal strictures status post dilation, hypertension and hyperlipidemia was recently admitted to [facility] 5 through the 8th because patient was thought to have a community-acquired pneumonia as well as a left upper lobe lung mass.  Pt transferred from surgical unit to SDU secondary to a-fib w RVR.   Spoke with pt and pt wife at bedside. Both report that appetite is improving and pt wife feels encouraged that pt "ate a good dinner" yesterday evening. Documented meal completion 100%. Pt expressed frustration over multiple NPO orders due to procedures. He reports his appetite is still not at baseline. RD offered nutritional supplements, but pt adamantly refused. Discussed importance of good meal completion to promote healing.   Case discussed with RN. Diet was just advanced to carb modified. Pulmonology just finished consult; plan is to undergo bronchoscopy next week.   Labs reviewed: CBGS: 104-176.  Diet Order:  Diet Carb Modified Fluid consistency:: Thin; Room service appropriate?: Yes  Skin:  Reviewed, no issues  Last BM:  08/01/15  Height:   Ht Readings from Last 1 Encounters:  07/29/15 '5\' 11"'$  (1.803 m)    Weight:   Wt Readings  from Last 1 Encounters:  07/29/15 203 lb 0.7 oz (92.1 kg)    Ideal Body Weight:  78.2 kg  BMI:  Body mass index is 28.33 kg/(m^2).  Estimated Nutritional Needs:   Kcal:  1950-2150  Protein:  90-100 grams  Fluid:  1.9-2.1 L  EDUCATION NEEDS:   No education needs identified at this time  Veta Dambrosia A. Jimmye Norman, RD, LDN, CDE Pager: [phone] After hours Pager: [phone]

## 2015-08-01 NOTE — Progress Notes (Signed)
    SUBJECTIVE:  Dyspneic and anxious.     PHYSICAL EXAM Filed Vitals:   08/01/15 0500 08/01/15 0600 08/01/15 0710 08/01/15 0743  BP: 159/90 165/87  182/129  Pulse: 98 103 113   Temp:    98.7 F (37.1 C)  TempSrc:    Oral  Resp: 17  21   Height:      Weight:      SpO2: 97% 100% 95%    General:  No acute distress Lungs:  Decreased breath sounds Heart:  RRR Abdomen:  Positive bowel sounds, no rebound no guarding Extremities:  No edema   LABS: Lab Results  Component Value Date   TROPONINI 0.04* 07/31/2015   Results for orders placed or performed during the hospital encounter of 07/28/15 (from the past 24 hour(s))  Troponin I (q 6hr x 3)     Status: Abnormal   Collection Time: 07/31/15  9:35 AM  Result Value Ref Range   Troponin I 0.04 (H) <0.031 ng/mL  Glucose, capillary     Status: Abnormal   Collection Time: 07/31/15 11:16 AM  Result Value Ref Range   Glucose-Capillary 153 (H) 65 - 99 mg/dL   Comment 1 Notify RN   Glucose, capillary     Status: Abnormal   Collection Time: 07/31/15  4:21 PM  Result Value Ref Range   Glucose-Capillary 173 (H) 65 - 99 mg/dL   Comment 1 Notify RN   Strep pneumoniae urinary antigen     Status: None   Collection Time: 07/31/15  5:30 PM  Result Value Ref Range   Strep Pneumo Urinary Antigen NEGATIVE NEGATIVE  Glucose, capillary     Status: Abnormal   Collection Time: 07/31/15  9:22 PM  Result Value Ref Range   Glucose-Capillary 176 (H) 65 - 99 mg/dL   Comment 1 Notify RN   CBC     Status: Abnormal   Collection Time: 07/31/15 11:55 PM  Result Value Ref Range   WBC 13.3 (H) 4.0 - 10.5 K/uL   RBC 3.30 (L) 4.22 - 5.81 MIL/uL   Hemoglobin 9.4 (L) 13.0 - 17.0 g/dL   HCT 28.2 (L) 39.0 - 52.0 %   MCV 85.5 78.0 - 100.0 fL   MCH 28.5 26.0 - 34.0 pg   MCHC 33.3 30.0 - 36.0 g/dL   RDW 14.4 11.5 - 15.5 %   Platelets 155 150 - 400 K/uL  Glucose, capillary     Status: Abnormal   Collection Time: 08/01/15  7:40 AM  Result Value Ref Range   Glucose-Capillary 125 (H) 65 - 99 mg/dL   Comment 1 Capillary Specimen     Intake/Output Summary (Last 24 hours) at 08/01/15 0858 Last data filed at 08/01/15 0600  Gross per 24 hour  Intake 3714.55 ml  Output   1100 ml  Net 2614.55 ml     ASSESSMENT AND PLAN:  Atrial fib:   Maintaining NSR.  No anticoagulation.   Continue amiodarone IV today.  Perhaps change to PO for a short course tomorrow after he has completed the stress of the bronch planned for today.   Chest pain:  Non anginal.  Trop minimal elevation is not diagnostic.      Minus Breeding 08/01/2015 8:58 AM

## 2015-08-01 NOTE — Progress Notes (Signed)
Patient stated he would put on CPAP when he is ready for bed. CPAP is a home unit. RT bled in 5L O2 into CPAP circuit. RT informed patient to have nurse call RT if assistance is needed. RT will continue to monitor as needed.

## 2015-08-01 NOTE — Progress Notes (Signed)
Pharmacy Antibiotic Note  [name] is a 65 y.o. male admitted on 07/28/2015 with pneumonia.    Day # 4 of broad spectrum antibiotics, afebrile, WBC WNL Cultures negative to date   Plan: Continue linezolid 600 mg iv Q 12 hours -- consider stopping? Continue Zosyn 3.375 grams iv Q 8 hours - 4 hr infusion    Height: '5\' 11"'$  (180.3 cm) Weight: 203 lb 0.7 oz (92.1 kg) IBW/kg (Calculated) : 75.3  Temp (24hrs), Avg:98.2 F (36.8 C), Min:97.6 F (36.4 C), Max:98.7 F (37.1 C)   Recent Labs Lab 07/28/15 2141 07/28/15 2316 07/29/15 0518 07/30/15 0654 07/31/15 0242 07/31/15 2355  WBC 17.9*  --  14.6* 15.3* 14.4* 13.3*  CREATININE 2.48*  --  2.26* 1.28* 0.90  --   LATICACIDVEN  --  1.6  --   --   --   --     Estimated Creatinine Clearance: 98.7 mL/min (by C-G formula based on Cr of 0.9).    Allergies  Allergen Reactions  . Nsaids Other (See Comments)    ulcers  . Prednisone Other (See Comments)    High sugar levels  . Vancomycin Other (See Comments)    Caused Stevens-Johnsons syndrome    Antimicrobials this admission: Zosyn >> 4/9 Linezolid>> 4/10 >  Dose adjustments this admission: n/a  Microbiology results: 4/10 BCx x 2: NGTD 4/10 Sputum:  Few yeast  Thank you for allowing pharmacy to be a part of this patient's care.  Uvaldo Rising, BCPS  Clinical Pharmacist Pager [phone]  08/01/2015 9:44 AM

## 2015-08-01 NOTE — Telephone Encounter (Signed)
FYI

## 2015-08-01 NOTE — Progress Notes (Addendum)
[facility] PROGRESS NOTE  [name] PRF:[id number] DOB: April 05, 1951 DOA: 07/28/2015 PCP: Fransisca Kaufmann Dettinger, MD  Assessment/Plan:  Atrial fibrillation - Cardiology consulted and currently managing. Pt on amiodarone   Acute kidney injury.  - resolved with IVF rehydration. Most likely prerenal cause   Hemoptysis. Chest pain  Patient was seen by Dr. Lamonte Sakai of critical care last hospitalization, he continues to have hemoptysis and was supposed to undergo fiberoptic bronchoscopy   - Pt to undergo the biopsy for an official diagnosis. - Awaiting bronchoscopy. Procedure held secondary to new onset of atrial fibrillation   HCAP - sputum culture reviewed no staph reported as such will d/c linezolid. -Continue with  Zosyn 3.375 g 3 times a day.     Pleuritic pain. Suspect secondary to the left upper lobe lung mass and left lower lobe pneumonia. Point was negative, EKG without evidence of ST elevation or depression. For now we'll continue to monitor on telemetry.    Iron deficiency anemia. Hemoglobin appears to be relatively stable.    Recent cervical collar fracture occurred about 6 weeks ago. Continues on oxycodone 30 mg twice a day and Percocet 5-3 25 every 6 hours when necessary.    Obstructive sleep apnea. CPAP daily at bedtime   Hyperlipidemia.  Continue atorvastatin 20 mg daily   Depression/anxiety. continue Wellbutrin 150 mg daily, Will add Ativan  Tobacco abuse. Topical NicoDerm 21 mg, smoking cessation advised  11. GERD.  Protonix 40 mg daily  12. DVT prophylaxis. SCDs given the hemoptysis  Code Status: DNR Family Communication: Wife at bedside.  Disposition Plan: Remain inpatient.    Consultants:  Pulmonary   Procedures:  none  Antibiotics:  Zosyn 4-10  HPI/Subjective: No new complaints reported. No acute issues reported overnight.  Objective: Filed Vitals:   08/01/15 0900 08/01/15 1000  BP: 183/85 162/86  Pulse: 113 102  Temp:    Resp: 22  14    Intake/Output Summary (Last 24 hours) at 08/01/15 1046 Last data filed at 08/01/15 1000  Gross per 24 hour  Intake 3921.22 ml  Output   1100 ml  Net 2821.22 ml   Filed Weights   07/28/15 2058 07/29/15 0235  Weight: 91.4 kg (201 lb 8 oz) 92.1 kg (203 lb 0.7 oz)    Exam:   General:  NAD, alert and awake  Cardiovascular: S 1, S 2 RRR  Respiratory: rhales, no wheezes, equal chest rise.  Abdomen: BS present, soft, nt  Musculoskeletal: no edema.   Data Reviewed: Basic Metabolic Panel:  Recent Labs Lab 07/28/15 2141 07/29/15 0518 07/30/15 0654 07/31/15 0242  NA 133* 134* 134* 134*  K 4.4 4.5 4.1 3.7  CL 94* 97* 99* 99*  CO2 '23 22 22 24  '$ GLUCOSE 74 94 182* 158*  BUN 40* 44* 27* 10  CREATININE 2.48* 2.26* 1.28* 0.90  CALCIUM 9.1 8.5* 9.1 9.0   Liver Function Tests:  Recent Labs Lab 07/28/15 2141  AST 37  ALT 21  ALKPHOS 68  BILITOT 0.7  PROT 7.0  ALBUMIN 3.3*    Recent Labs Lab 07/28/15 2141  LIPASE 18   No results for input(s): AMMONIA in the last 168 hours. CBC:  Recent Labs Lab 07/28/15 2141 07/29/15 0518 07/30/15 0654 07/31/15 0242 07/31/15 2355  WBC 17.9* 14.6* 15.3* 14.4* 13.3*  NEUTROABS 13.9* 11.0*  --  12.4*  --   HGB 10.5* 10.1* 10.1* 9.8* 9.4*  HCT 32.6* 31.3* 31.6* 30.6* 28.2*  MCV 85.1 85.1 86.6 84.5 85.5  PLT 177 159 161 154 155   Cardiac Enzymes:  Recent Labs Lab 07/29/15 1714 07/29/15 2105 07/30/15 2142 07/31/15 0242 07/31/15 0935  TROPONINI 0.06* <0.03 <0.03 0.03 0.04*   BNP (last 3 results)  Recent Labs  07/23/15 2241  BNP 12.0    ProBNP (last 3 results) No results for input(s): PROBNP in the last 8760 hours.  CBG:  Recent Labs Lab 07/31/15 0747 07/31/15 1116 07/31/15 1621 07/31/15 2122 08/01/15 0740  GLUCAP 149* 153* 173* 176* 125*    Recent Results (from the past 240 hour(s))  MRSA PCR Screening     Status: None   Collection Time: 07/24/15  2:55 AM  Result Value Ref Range Status    MRSA by PCR NEGATIVE NEGATIVE Final    Comment:        The GeneXpert MRSA Assay (FDA approved for NASAL specimens only), is one component of a comprehensive MRSA colonization surveillance program. It is not intended to diagnose MRSA infection nor to guide or monitor treatment for MRSA infections.   Culture, blood (routine x 2) Call MD if unable to obtain prior to antibiotics being given     Status: None (Preliminary result)   Collection Time: 07/29/15  1:37 AM  Result Value Ref Range Status   Specimen Description BLOOD RIGHT ARM  Final   Special Requests BOTTLES DRAWN AEROBIC ONLY 5CC  Final   Culture NO GROWTH 2 DAYS  Final   Report Status PENDING  Incomplete  Culture, blood (routine x 2) Call MD if unable to obtain prior to antibiotics being given     Status: None (Preliminary result)   Collection Time: 07/29/15  1:42 AM  Result Value Ref Range Status   Specimen Description BLOOD RIGHT ARM  Final   Special Requests BOTTLES DRAWN AEROBIC ONLY 5CC  Final   Culture NO GROWTH 2 DAYS  Final   Report Status PENDING  Incomplete  Culture, sputum-assessment     Status: None   Collection Time: 07/29/15  7:34 AM  Result Value Ref Range Status   Specimen Description SPUTUM  Final   Special Requests NONE  Final   Sputum evaluation   Final    THIS SPECIMEN IS ACCEPTABLE. RESPIRATORY CULTURE REPORT TO FOLLOW.   Report Status 07/29/2015 FINAL  Final  Culture, respiratory (NON-Expectorated)     Status: None   Collection Time: 07/29/15  8:20 AM  Result Value Ref Range Status   Specimen Description SPUTUM  Final   Special Requests NONE  Final   Gram Stain   Final    ABUNDANT WBC PRESENT,BOTH PMN AND MONONUCLEAR FEW SQUAMOUS EPITHELIAL CELLS PRESENT NO ORGANISMS SEEN Performed at [facility]    Culture   Final    FEW CANDIDA ALBICANS Performed at [facility]    Report Status 07/31/2015 FINAL  Final     Studies: No results found.  Scheduled Meds: . antiseptic  oral rinse  7 mL Mouth Rinse BID  . atorvastatin  20 mg Oral Daily  . buPROPion  150 mg Oral Daily  . fluticasone furoate-vilanterol  1 puff Inhalation Daily  . gabapentin  600 mg Oral BID  . insulin aspart  0-20 Units Subcutaneous TID WC  . insulin aspart  0-5 Units Subcutaneous QHS  . ipratropium-albuterol  3 mL Nebulization TID  . nitroGLYCERIN  0.5 inch Topical 3 times per day  . oxyCODONE  30 mg Oral TID  . pantoprazole  40 mg Oral Daily  . piperacillin-tazobactam (ZOSYN)  IV  3.375 g Intravenous 3 times per day   Continuous Infusions: . sodium chloride 100 mL/hr at 08/01/15 0700  . amiodarone 30 mg/hr (08/01/15 0700)  . diltiazem (CARDIZEM) infusion Stopped (07/31/15 1119)    Active Problems:   Hemoptysis   AKI (acute kidney injury) ([place])   Atrial fibrillation with RVR (HCC)   Tobacco use disorder  Time spent: 25 minutes.    Velvet Bathe  [facility] Pager [phone]. If 7PM-7AM, please contact night-coverage at [email or web address], password [facility] 08/01/2015, 10:46 AM  LOS: 3 days     Addendum:  In response to query. I suspect patient has acute on chronic respiratory failure.

## 2015-08-02 LAB — TSH: TSH: 3.796 u[IU]/mL (ref 0.350–4.500)

## 2015-08-02 LAB — GLUCOSE, CAPILLARY
GLUCOSE-CAPILLARY: 111 mg/dL — AB (ref 65–99)
GLUCOSE-CAPILLARY: 118 mg/dL — AB (ref 65–99)
GLUCOSE-CAPILLARY: 164 mg/dL — AB (ref 65–99)
Glucose-Capillary: 160 mg/dL — ABNORMAL HIGH (ref 65–99)

## 2015-08-02 MED ORDER — LEVOFLOXACIN 750 MG PO TABS
750.0000 mg | ORAL_TABLET | ORAL | Status: DC
  Administered 2015-08-02 – 2015-08-07 (×6): 750 mg via ORAL
  Filled 2015-08-02 (×6): qty 1

## 2015-08-02 MED ORDER — AMIODARONE HCL 200 MG PO TABS
400.0000 mg | ORAL_TABLET | Freq: Two times a day (BID) | ORAL | Status: DC
  Administered 2015-08-02 – 2015-08-11 (×19): 400 mg via ORAL
  Filled 2015-08-02 (×19): qty 2

## 2015-08-02 MED ORDER — ATENOLOL 50 MG PO TABS
50.0000 mg | ORAL_TABLET | Freq: Every day | ORAL | Status: DC
Start: 2015-08-02 — End: 2015-08-03
  Administered 2015-08-02 – 2015-08-03 (×2): 50 mg via ORAL
  Filled 2015-08-02 (×2): qty 1

## 2015-08-02 MED ORDER — FUROSEMIDE 10 MG/ML IJ SOLN
40.0000 mg | Freq: Once | INTRAMUSCULAR | Status: AC
  Administered 2015-08-02: 40 mg via INTRAVENOUS
  Filled 2015-08-02: qty 4

## 2015-08-02 NOTE — Progress Notes (Signed)
    SUBJECTIVE:  Less anxious today.  Still with hemoptysis but less    PHYSICAL EXAM Filed Vitals:   08/02/15 0415 08/02/15 0430 08/02/15 0500 08/02/15 0600  BP: 147/85 168/84 148/90 158/91  Pulse: 87 88 92 92  Temp:      TempSrc:      Resp: '15 17 13 12  '$ Height:      Weight:      SpO2: 100% 100% 97% 99%   General:  No acute distress Lungs:  Decreased breath sounds Heart:  RRR Abdomen:  Positive bowel sounds, no rebound no guarding Extremities:  No edema   LABS:  Results for orders placed or performed during the hospital encounter of 07/28/15 (from the past 24 hour(s))  Glucose, capillary     Status: Abnormal   Collection Time: 08/01/15  7:40 AM  Result Value Ref Range   Glucose-Capillary 125 (H) 65 - 99 mg/dL   Comment 1 Capillary Specimen   Glucose, capillary     Status: Abnormal   Collection Time: 08/01/15 11:41 AM  Result Value Ref Range   Glucose-Capillary 104 (H) 65 - 99 mg/dL   Comment 1 Capillary Specimen   Glucose, capillary     Status: Abnormal   Collection Time: 08/01/15  4:29 PM  Result Value Ref Range   Glucose-Capillary 216 (H) 65 - 99 mg/dL   Comment 1 Capillary Specimen   Glucose, capillary     Status: Abnormal   Collection Time: 08/01/15  9:34 PM  Result Value Ref Range   Glucose-Capillary 106 (H) 65 - 99 mg/dL   Comment 1 Capillary Specimen     Intake/Output Summary (Last 24 hours) at 08/02/15 0724 Last data filed at 08/02/15 0600  Gross per 24 hour  Intake 3451.6 ml  Output   2350 ml  Net 1101.6 ml     ASSESSMENT AND PLAN:  Atrial fib:   Maintaining NSR.  No anticoagulation.   I will switch to PO amiodarone at least through this hospitalization.   Start back atenolol.   Chest pain:  Non anginal.  Trop minimal elevation is not diagnostic.      Jeneen Rinks [facility] 08/02/2015 7:24 AM

## 2015-08-02 NOTE — Progress Notes (Signed)
Physical Therapy Treatment Patient Details Name: [name] MRN: [id number] DOB: 06-21-1950 Today's Date: 08/02/2015    History of Present Illness 65 y.o. male admitted to [facility] on 07/28/15 with chest pain and hemoptysis.  Dx with acute kidney injury, hemoptysis (will undergo bronchoscopy with possible biopsy 07/31/15), HCAP, and pleuritis chest pain.  Pt with significant PMhx of collar bone fx ~6 weeks prior to admission, essential HTN, DM2, Stevens-Johnson syndrome, PAD, neuropathy, neck surgery, back surgery, and basal cell carcinoma (2010).      PT Comments    Patient again limited with therapy this session. Patient with increased anxiety and agitation (agitation new today compared to previous interactions with this patient, nsg made aware). Patient still with elevated BPs. Will continue to see and progress as tolerated.   Follow Up Recommendations  Home health PT;Supervision/Assistance - 24 hour     Equipment Recommendations  Rolling walker with 5" wheels;Other (comment) (rollator- 4 wheels and a seat)    Recommendations for Other Services       Precautions / Restrictions Precautions Precautions: Other (comment);Fall Precaution Comments: monitor O2 sats Restrictions Weight Bearing Restrictions: No    Mobility  Bed Mobility Overal bed mobility: Modified Independent             General bed mobility comments: increased time to perform, no physical assist required  Transfers Overall transfer level: Needs assistance Equipment used: 1 person hand held assist Transfers: Sit to/from Stand Sit to Stand: Min guard         General transfer comment: Min guard for stability upon coming to standing  Ambulation/Gait Ambulation/Gait assistance: Supervision Ambulation Distance (Feet): 6 Feet Assistive device: 1 person hand held assist Gait Pattern/deviations: Shuffle     General Gait Details: attempted mobility, patient with increased anxiety and agitation, patient  mobilized from bed to chair, but refused to mobilize further this session.   Stairs            Wheelchair Mobility    Modified Rankin (Stroke Patients Only)       Balance     Sitting balance-Leahy Scale: Good     Standing balance support: Single extremity supported Standing balance-Leahy Scale: Fair                      Cognition Arousal/Alertness: Awake/alert Behavior During Therapy: Anxious Overall Cognitive Status: Within Functional Limits for tasks assessed                      Exercises      General Comments General comments (skin integrity, edema, etc.): Patient reports difficulty breathing but saturations remian 99% on 3 liters, patient HR 100s, patient also with BP 681E systolic/100s      Pertinent Vitals/Pain Pain Assessment: No/denies pain    Home Living                      Prior Function            PT Goals (current goals can now be found in the care plan section) Acute Rehab PT Goals Patient Stated Goal: to feel better PT Goal Formulation: With patient/family Time For Goal Achievement: 08/13/15 Potential to Achieve Goals: Good Progress towards PT goals: Not progressing toward goals - comment (pt with increased agitation and anxiety this session)    Frequency  Min 3X/week    PT Plan Current plan remains appropriate    Co-evaluation  End of Session Equipment Utilized During Treatment: Oxygen Activity Tolerance: Patient limited by fatigue;Treatment limited secondary to agitation (limited by pulmonary tolerance) Patient left: in chair;with call bell/phone within reach;with nursing/sitter in room;with family/visitor present     Time: 0950-1004 PT Time Calculation (min) (ACUTE ONLY): 14 min  Charges:  $Therapeutic Activity: 8-22 mins                    G CodesDuncan Dull 2015/08/17, 1:46 PM Alben Deeds, [place] DPT  [phone]

## 2015-08-02 NOTE — Progress Notes (Signed)
Pt c/o increased work of breathing, E-link paged, MD made aware.

## 2015-08-02 NOTE — Progress Notes (Signed)
ANTIBIOTIC CONSULT NOTE - INITIAL  Pharmacy Consult for Levaquin Indication: HcAP  Allergies  Allergen Reactions  . Nsaids Other (See Comments)    ulcers  . Prednisone Other (See Comments)    High sugar levels  . Vancomycin Other (See Comments)    Caused Stevens-Johnsons syndrome    Patient Measurements: Height: '5\' 11"'$  (180.3 cm) Weight: 203 lb 0.7 oz (92.1 kg) IBW/kg (Calculated) : 75.3 Adjusted Body Weight:    Vital Signs: Temp: 98 F (36.7 C) (04/14 1612) Temp Source: Oral (04/14 1612) BP: 150/82 mmHg (04/14 1612) Pulse Rate: 78 (04/14 1612) Intake/Output from previous day: 04/13 0701 - 04/14 0700 In: 3451.6 [P.O.:480; I.V.:2584.1; IV Piggyback:387.5] Out: 2350 [Urine:2350] Intake/Output from this shift: Total I/O In: 1081.7 [P.O.:300; I.V.:631.7; IV Piggyback:150] Out: 600 [Urine:600]  Labs:  Recent Labs  07/31/15 0242 07/31/15 2355  WBC 14.4* 13.3*  HGB 9.8* 9.4*  PLT 154 155  CREATININE 0.90  --    Estimated Creatinine Clearance: 98.7 mL/min (by C-G formula based on Cr of 0.9). No results for input(s): VANCOTROUGH, VANCOPEAK, VANCORANDOM, GENTTROUGH, GENTPEAK, GENTRANDOM, TOBRATROUGH, TOBRAPEAK, TOBRARND, AMIKACINPEAK, AMIKACINTROU, AMIKACIN in the last 72 hours.   Microbiology:   Medical History: Past Medical History  Diagnosis Date  . Essential hypertension, benign   . Type 2 diabetes mellitus ([place])   . Mixed hyperlipidemia   . OSA (obstructive sleep apnea)   . Partial small bowel obstruction ([place])   . 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 Coronary atherosclerosis of native coronary artery     Nonobstructive  . Erythrocytosis   . Peripheral arterial disease (HCC)     70% right iliac  . History of MRSA infection   . Neuropathy ([place]) 2013  . Cancer ([place]) 2015    skin  . Cancer of lung ([place]) 07-24-15    Assessment: 65 y/o M on abx for HCAP/ acute on chronic respiratory failure . He is also experiencing pleuritic pain secondary  to the left upper lobe lung mass and left lower lobe pneumonis and hemoptysis. Received Linezolid 4/10-4/13 due to Vancomycin allergy. No staph cultured.  Afebrile. WBC 13.3 down slightly. Scr 0.9. CrCl 98  Resp cx: few candida  Goal of Therapy:  Eradication of infection   Plan:  Levaquin '750mg'$  po q 24h Pharmacy will sign off. Please reconsult for further dosing assitance.   Eilene Ghazi Stillinger 08/02/2015,5:03 PM

## 2015-08-02 NOTE — Progress Notes (Signed)
[facility] PROGRESS NOTE  [name] RCV:[id number] DOB: 03/24/1951 DOA: 07/28/2015 PCP: Fransisca Kaufmann Dettinger, MD  Assessment/Plan:  Atrial fibrillation - Cardiology consulted and currently managing. Pt on amiodarone and atenolol  Acute kidney injury.  - resolved with IVF rehydration. Most likely prerenal cause   Hemoptysis. Chest pain  Patient was seen by Dr. Lamonte Sakai of critical care last hospitalization, he continues to have hemoptysis and was supposed to undergo fiberoptic bronchoscopy   - Pt to undergo the biopsy for an official diagnosis. - Awaiting bronchoscopy. Procedure held secondary to new onset of atrial fibrillation   HCAP/ acute on chronic respiratory failure  - sputum culture reviewed no staph reported as such will d/c linezolid. -will transition to oral antibiotic.   Pleuritic pain. Suspect secondary to the left upper lobe lung mass and left lower lobe pneumonia. Point was negative, EKG without evidence of ST elevation or depression. For now we'll continue to monitor on telemetry.    Iron deficiency anemia. Hemoglobin appears to be relatively stable.    Recent cervical collar fracture occurred about 6 weeks ago. Continues on oxycodone 30 mg twice a day and Percocet 5-325 every 6 hours when necessary.    Obstructive sleep apnea. CPAP daily at bedtime   Hyperlipidemia.  Continue atorvastatin 20 mg daily   Depression/anxiety. continue Wellbutrin 150 mg daily, Will add Ativan  Tobacco abuse. Topical NicoDerm 21 mg, smoking cessation advised  11. GERD.  Protonix 40 mg daily  12. DVT prophylaxis. SCDs given the hemoptysis  Code Status: DNR Family Communication: Wife at bedside.  Disposition Plan: Remain inpatient.    Consultants:  Pulmonary   Procedures:  none  Antibiotics:  Zosyn 4-10  HPI/Subjective: No new complaints reported. No acute issues reported overnight.  Objective: Filed Vitals:   08/02/15 1441 08/02/15 1612  BP:  150/82   Pulse: 71 78  Temp:  98 F (36.7 C)  Resp: 19 15    Intake/Output Summary (Last 24 hours) at 08/02/15 1653 Last data filed at 08/02/15 1500  Gross per 24 hour  Intake 2968.03 ml  Output   1900 ml  Net 1068.03 ml   Filed Weights   07/28/15 2058 07/29/15 0235  Weight: 91.4 kg (201 lb 8 oz) 92.1 kg (203 lb 0.7 oz)    Exam:   General:  NAD, alert and awake  Cardiovascular: S 1, S 2 RRR  Respiratory: rhales, no wheezes, equal chest rise.  Abdomen: BS present, soft, nt  Musculoskeletal: no edema.   Data Reviewed: Basic Metabolic Panel:  Recent Labs Lab 07/28/15 2141 07/29/15 0518 07/30/15 0654 07/31/15 0242  NA 133* 134* 134* 134*  K 4.4 4.5 4.1 3.7  CL 94* 97* 99* 99*  CO2 '23 22 22 24  '$ GLUCOSE 74 94 182* 158*  BUN 40* 44* 27* 10  CREATININE 2.48* 2.26* 1.28* 0.90  CALCIUM 9.1 8.5* 9.1 9.0   Liver Function Tests:  Recent Labs Lab 07/28/15 2141  AST 37  ALT 21  ALKPHOS 68  BILITOT 0.7  PROT 7.0  ALBUMIN 3.3*    Recent Labs Lab 07/28/15 2141  LIPASE 18   No results for input(s): AMMONIA in the last 168 hours. CBC:  Recent Labs Lab 07/28/15 2141 07/29/15 0518 07/30/15 0654 07/31/15 0242 07/31/15 2355  WBC 17.9* 14.6* 15.3* 14.4* 13.3*  NEUTROABS 13.9* 11.0*  --  12.4*  --   HGB 10.5* 10.1* 10.1* 9.8* 9.4*  HCT 32.6* 31.3* 31.6* 30.6* 28.2*  MCV 85.1 85.1 86.6 84.5  85.5  PLT 177 159 161 154 155   Cardiac Enzymes:  Recent Labs Lab 07/29/15 1714 07/29/15 2105 07/30/15 2142 07/31/15 0242 07/31/15 0935  TROPONINI 0.06* <0.03 <0.03 0.03 0.04*   BNP (last 3 results)  Recent Labs  07/23/15 2241  BNP 12.0    ProBNP (last 3 results) No results for input(s): PROBNP in the last 8760 hours.  CBG:  Recent Labs Lab 08/01/15 1629 08/01/15 2134 08/02/15 0823 08/02/15 1211 08/02/15 1613  GLUCAP 216* 106* 111* 118* 160*    Recent Results (from the past 240 hour(s))  MRSA PCR Screening     Status: None   Collection Time:  07/24/15  2:55 AM  Result Value Ref Range Status   MRSA by PCR NEGATIVE NEGATIVE Final    Comment:        The GeneXpert MRSA Assay (FDA approved for NASAL specimens only), is one component of a comprehensive MRSA colonization surveillance program. It is not intended to diagnose MRSA infection nor to guide or monitor treatment for MRSA infections.   Culture, blood (routine x 2) Call MD if unable to obtain prior to antibiotics being given     Status: None (Preliminary result)   Collection Time: 07/29/15  1:37 AM  Result Value Ref Range Status   Specimen Description BLOOD RIGHT ARM  Final   Special Requests BOTTLES DRAWN AEROBIC ONLY 5CC  Final   Culture NO GROWTH 4 DAYS  Final   Report Status PENDING  Incomplete  Culture, blood (routine x 2) Call MD if unable to obtain prior to antibiotics being given     Status: None (Preliminary result)   Collection Time: 07/29/15  1:42 AM  Result Value Ref Range Status   Specimen Description BLOOD RIGHT ARM  Final   Special Requests BOTTLES DRAWN AEROBIC ONLY 5CC  Final   Culture NO GROWTH 4 DAYS  Final   Report Status PENDING  Incomplete  Culture, sputum-assessment     Status: None   Collection Time: 07/29/15  7:34 AM  Result Value Ref Range Status   Specimen Description SPUTUM  Final   Special Requests NONE  Final   Sputum evaluation   Final    THIS SPECIMEN IS ACCEPTABLE. RESPIRATORY CULTURE REPORT TO FOLLOW.   Report Status 07/29/2015 FINAL  Final  Culture, respiratory (NON-Expectorated)     Status: None   Collection Time: 07/29/15  8:20 AM  Result Value Ref Range Status   Specimen Description SPUTUM  Final   Special Requests NONE  Final   Gram Stain   Final    ABUNDANT WBC PRESENT,BOTH PMN AND MONONUCLEAR FEW SQUAMOUS EPITHELIAL CELLS PRESENT NO ORGANISMS SEEN Performed at [facility]    Culture   Final    FEW CANDIDA ALBICANS Performed at [facility]    Report Status 07/31/2015 FINAL  Final      Studies: No results found.  Scheduled Meds: . amiodarone  400 mg Oral BID  . antiseptic oral rinse  7 mL Mouth Rinse BID  . atenolol  50 mg Oral Daily  . atorvastatin  20 mg Oral Daily  . buPROPion  150 mg Oral Daily  . fluticasone furoate-vilanterol  1 puff Inhalation Daily  . gabapentin  600 mg Oral BID  . insulin aspart  0-20 Units Subcutaneous TID WC  . insulin aspart  0-5 Units Subcutaneous QHS  . ipratropium-albuterol  3 mL Nebulization TID  . nitroGLYCERIN  0.5 inch Topical 3 times per day  . oxyCODONE  30 mg Oral TID  . pantoprazole  40 mg Oral Daily  . piperacillin-tazobactam (ZOSYN)  IV  3.375 g Intravenous 3 times per day   Continuous Infusions: . sodium chloride 100 mL/hr at 08/02/15 1600  . diltiazem (CARDIZEM) infusion Stopped (07/31/15 1119)    Active Problems:   Hemoptysis   AKI (acute kidney injury) ([place])   Atrial fibrillation with RVR (HCC)   Tobacco use disorder   Shortness of breath  Time spent: 25 minutes.    Velvet Bathe  [facility] Pager [phone]. If 7PM-7AM, please contact night-coverage at [email or web address], password [facility] 08/02/2015, 4:53 PM  LOS: 4 days

## 2015-08-02 NOTE — Progress Notes (Signed)
Patient stated he would put CPAP on when he is ready for bed. 5L O2 is bled into cpap circuit. RT informed patient to call if he needs assistance.

## 2015-08-03 ENCOUNTER — Inpatient Hospital Stay (HOSPITAL_COMMUNITY): Payer: Medicare Other

## 2015-08-03 DIAGNOSIS — J9621 Acute and chronic respiratory failure with hypoxia: Secondary | ICD-10-CM

## 2015-08-03 LAB — GLUCOSE, CAPILLARY
Glucose-Capillary: 129 mg/dL — ABNORMAL HIGH (ref 65–99)
Glucose-Capillary: 157 mg/dL — ABNORMAL HIGH (ref 65–99)
Glucose-Capillary: 94 mg/dL (ref 65–99)
Glucose-Capillary: 123 mg/dL — ABNORMAL HIGH (ref 65–99)

## 2015-08-03 LAB — CBC
HEMATOCRIT: 31.5 % — AB (ref 39.0–52.0)
Hemoglobin: 10.2 g/dL — ABNORMAL LOW (ref 13.0–17.0)
MCH: 27.4 pg (ref 26.0–34.0)
MCHC: 32.4 g/dL (ref 30.0–36.0)
MCV: 84.7 fL (ref 78.0–100.0)
Platelets: 206 10*3/uL (ref 150–400)
RBC: 3.72 MIL/uL — ABNORMAL LOW (ref 4.22–5.81)
RDW: 14.4 % (ref 11.5–15.5)
WBC: 10.7 10*3/uL — ABNORMAL HIGH (ref 4.0–10.5)

## 2015-08-03 LAB — BASIC METABOLIC PANEL
ANION GAP: 16 — AB (ref 5–15)
BUN: 7 mg/dL (ref 6–20)
CHLORIDE: 98 mmol/L — AB (ref 101–111)
CO2: 26 mmol/L (ref 22–32)
Calcium: 8.7 mg/dL — ABNORMAL LOW (ref 8.9–10.3)
Creatinine, Ser: 0.82 mg/dL (ref 0.61–1.24)
GFR calc Af Amer: >60 mL/min (ref >60)
GFR calc non Af Amer: >60 mL/min (ref >60)
Glucose, Bld: 113 mg/dL — ABNORMAL HIGH (ref 65–99)
POTASSIUM: 3.3 mmol/L — AB (ref 3.5–5.1)
SODIUM: 140 mmol/L (ref 135–145)

## 2015-08-03 LAB — CULTURE, BLOOD (ROUTINE X 2)
Culture: NO GROWTH
Culture: NO GROWTH

## 2015-08-03 MED ORDER — FUROSEMIDE 10 MG/ML IJ SOLN
80.0000 mg | Freq: Once | INTRAMUSCULAR | Status: AC
  Administered 2015-08-03: 80 mg via INTRAVENOUS
  Filled 2015-08-03: qty 8

## 2015-08-03 MED ORDER — OXYCODONE-ACETAMINOPHEN 7.5-325 MG PO TABS
1.0000 | ORAL_TABLET | Freq: Four times a day (QID) | ORAL | Status: DC | PRN
  Administered 2015-08-03 – 2015-08-04 (×4): 2 via ORAL
  Administered 2015-08-04: 1 via ORAL
  Administered 2015-08-04 – 2015-08-09 (×15): 2 via ORAL
  Administered 2015-08-10: 1 via ORAL
  Filled 2015-08-03 (×19): qty 2
  Filled 2015-08-03: qty 1
  Filled 2015-08-03: qty 2

## 2015-08-03 MED ORDER — FUROSEMIDE 10 MG/ML IJ SOLN
80.0000 mg | Freq: Two times a day (BID) | INTRAMUSCULAR | Status: AC
  Administered 2015-08-03: 80 mg via INTRAVENOUS
  Filled 2015-08-03: qty 8

## 2015-08-03 MED ORDER — POTASSIUM CHLORIDE CRYS ER 20 MEQ PO TBCR
40.0000 meq | EXTENDED_RELEASE_TABLET | Freq: Two times a day (BID) | ORAL | Status: AC
  Administered 2015-08-03 – 2015-08-04 (×3): 40 meq via ORAL
  Filled 2015-08-03 (×2): qty 2

## 2015-08-03 NOTE — Progress Notes (Signed)
[facility] PROGRESS NOTE  [name] ATF:[id number] DOB: 11/08/1950 DOA: 07/28/2015 PCP: Fransisca Kaufmann Dettinger, MD  Assessment/Plan:  Atrial fibrillation - Cardiology consulted and currently managing. Pt on amiodarone and atenolol  Acute kidney injury.  - resolved with IVF rehydration. Most likely prerenal cause  Acute respiratory failure - Multifactorial. Suspect patient is fluid overloaded. Will d/c fluids. Agree with diuresis recommended by cardiology. Chest x ray reviewed.   Hemoptysis. Chest pain  Patient was seen by Dr. Lamonte Sakai of critical care last hospitalization, he continues to have hemoptysis and was supposed to undergo fiberoptic bronchoscopy   - Pt to undergo the biopsy for an official diagnosis. - Awaiting bronchoscopy. Procedure held secondary to new onset of atrial fibrillation   HCAP  - sputum culture reviewed no staph reported as such will d/c linezolid. -will transition to oral antibiotic.   Pleuritic pain. Suspect secondary to the left upper lobe lung mass and left lower lobe pneumonia. Point was negative, EKG without evidence of ST elevation or depression. For now we'll continue to monitor on telemetry.    Iron deficiency anemia. Hemoglobin appears to be relatively stable.    Recent cervical collar fracture occurred about 6 weeks ago. Continues on oxycodone 30 mg twice a day and will change Percocet to 7-325 every 6 hours when necessary as patient complaining of discomfort   Obstructive sleep apnea. CPAP daily at bedtime   Hyperlipidemia.  Continue atorvastatin 20 mg daily   Depression/anxiety. continue Wellbutrin 150 mg daily, Will add Ativan  Tobacco abuse. Topical NicoDerm 21 mg, smoking cessation advised  11. GERD.  Protonix 40 mg daily  12. DVT prophylaxis. SCDs given the hemoptysis  Code Status: DNR Family Communication: Wife at bedside.  Disposition Plan: Remain inpatient.    Consultants:  Pulmonary    Procedures:  none  Antibiotics:  Zosyn 4-10  HPI/Subjective: No new complaints reported. No acute issues reported overnight.  Objective: Filed Vitals:   08/03/15 0600 08/03/15 0737  BP: 168/83 154/85  Pulse: 93 87  Temp:  98.2 F (36.8 C)  Resp:  12    Intake/Output Summary (Last 24 hours) at 08/03/15 1226 Last data filed at 08/03/15 1100  Gross per 24 hour  Intake   2210 ml  Output   2700 ml  Net   -490 ml   Filed Weights   07/28/15 2058 07/29/15 0235  Weight: 91.4 kg (201 lb 8 oz) 92.1 kg (203 lb 0.7 oz)    Exam:   General:  NAD, alert and awake  Cardiovascular: S 1, S 2 RRR  Respiratory: rhales, no wheezes, equal chest rise.  Abdomen: BS present, soft, nt  Musculoskeletal: no edema.   Data Reviewed: Basic Metabolic Panel:  Recent Labs Lab 07/28/15 2141 07/29/15 0518 07/30/15 0654 07/31/15 0242 08/03/15 0600  NA 133* 134* 134* 134* 140  K 4.4 4.5 4.1 3.7 3.3*  CL 94* 97* 99* 99* 98*  CO2 '23 22 22 24 26  '$ GLUCOSE 74 94 182* 158* 113*  BUN 40* 44* 27* 10 7  CREATININE 2.48* 2.26* 1.28* 0.90 0.82  CALCIUM 9.1 8.5* 9.1 9.0 8.7*   Liver Function Tests:  Recent Labs Lab 07/28/15 2141  AST 37  ALT 21  ALKPHOS 68  BILITOT 0.7  PROT 7.0  ALBUMIN 3.3*    Recent Labs Lab 07/28/15 2141  LIPASE 18   No results for input(s): AMMONIA in the last 168 hours. CBC:  Recent Labs Lab 07/28/15 2141 07/29/15 0518 07/30/15 0654 07/31/15 2706  07/31/15 2355 08/03/15 0600  WBC 17.9* 14.6* 15.3* 14.4* 13.3* 10.7*  NEUTROABS 13.9* 11.0*  --  12.4*  --   --   HGB 10.5* 10.1* 10.1* 9.8* 9.4* 10.2*  HCT 32.6* 31.3* 31.6* 30.6* 28.2* 31.5*  MCV 85.1 85.1 86.6 84.5 85.5 84.7  PLT 177 159 161 154 155 206   Cardiac Enzymes:  Recent Labs Lab 07/29/15 1714 07/29/15 2105 07/30/15 2142 07/31/15 0242 07/31/15 0935  TROPONINI 0.06* <0.03 <0.03 0.03 0.04*   BNP (last 3 results)  Recent Labs  07/23/15 2241  BNP 12.0    ProBNP (last 3  results) No results for input(s): PROBNP in the last 8760 hours.  CBG:  Recent Labs Lab 08/02/15 1211 08/02/15 1613 08/02/15 2121 08/03/15 0741 08/03/15 1133  GLUCAP 118* 160* 164* 129* 157*    Recent Results (from the past 240 hour(s))  Culture, blood (routine x 2) Call MD if unable to obtain prior to antibiotics being given     Status: None (Preliminary result)   Collection Time: 07/29/15  1:37 AM  Result Value Ref Range Status   Specimen Description BLOOD RIGHT ARM  Final   Special Requests BOTTLES DRAWN AEROBIC ONLY 5CC  Final   Culture NO GROWTH 4 DAYS  Final   Report Status PENDING  Incomplete  Culture, blood (routine x 2) Call MD if unable to obtain prior to antibiotics being given     Status: None (Preliminary result)   Collection Time: 07/29/15  1:42 AM  Result Value Ref Range Status   Specimen Description BLOOD RIGHT ARM  Final   Special Requests BOTTLES DRAWN AEROBIC ONLY 5CC  Final   Culture NO GROWTH 4 DAYS  Final   Report Status PENDING  Incomplete  Culture, sputum-assessment     Status: None   Collection Time: 07/29/15  7:34 AM  Result Value Ref Range Status   Specimen Description SPUTUM  Final   Special Requests NONE  Final   Sputum evaluation   Final    THIS SPECIMEN IS ACCEPTABLE. RESPIRATORY CULTURE REPORT TO FOLLOW.   Report Status 07/29/2015 FINAL  Final  Culture, respiratory (NON-Expectorated)     Status: None   Collection Time: 07/29/15  8:20 AM  Result Value Ref Range Status   Specimen Description SPUTUM  Final   Special Requests NONE  Final   Gram Stain   Final    ABUNDANT WBC PRESENT,BOTH PMN AND MONONUCLEAR FEW SQUAMOUS EPITHELIAL CELLS PRESENT NO ORGANISMS SEEN Performed at [facility]    Culture   Final    FEW CANDIDA ALBICANS Performed at [facility]    Report Status 07/31/2015 FINAL  Final     Studies: Dg Chest Port 1 View  08/03/2015  CLINICAL DATA:  65 year old male with shortness of breath and wheezing EXAM:  PORTABLE CHEST 1 VIEW COMPARISON:  Prior chest x-ray 07/28/2015; prior chest CT 07/24/2015 FINDINGS: Interval development of diffuse bilateral predominantly interstitial opacities. There is more dense opacifications in the left base obscuring the heart margin and diaphragm. Mediastinal fullness with bulky nodular opacities in the right paratracheal stripe are similar compared to prior. Left lower lobe pulmonary nodules or now obscured. No acute osseous abnormality. IMPRESSION: 1. Interval development of mild pulmonary edema concerning for volume overload versus CHF. 2. Possible left pleural effusion. 3. Similar appearance of mediastinal and left hilar adenopathy. The known left lower lobe pulmonary nodules are now obscured from view by the edema and possible left-sided pleural effusion. Electronically Signed  By: Jacqulynn Cadet M.D.   On: 08/03/2015 11:35    Scheduled Meds: . amiodarone  400 mg Oral BID  . antiseptic oral rinse  7 mL Mouth Rinse BID  . atorvastatin  20 mg Oral Daily  . buPROPion  150 mg Oral Daily  . fluticasone furoate-vilanterol  1 puff Inhalation Daily  . furosemide  80 mg Intravenous BID  . gabapentin  600 mg Oral BID  . insulin aspart  0-20 Units Subcutaneous TID WC  . insulin aspart  0-5 Units Subcutaneous QHS  . ipratropium-albuterol  3 mL Nebulization TID  . levofloxacin  750 mg Oral Q24H  . nitroGLYCERIN  0.5 inch Topical 3 times per day  . oxyCODONE  30 mg Oral TID  . pantoprazole  40 mg Oral Daily  . potassium chloride  40 mEq Oral BID   Continuous Infusions: . diltiazem (CARDIZEM) infusion Stopped (07/31/15 1119)    Active Problems:   Hemoptysis   AKI (acute kidney injury) ([place])   Atrial fibrillation with RVR (HCC)   Tobacco use disorder   Shortness of breath  Time spent: 25 minutes.    Velvet Bathe  [facility] Pager [phone]. If 7PM-7AM, please contact night-coverage at [email or web address], password [facility] 08/03/2015, 12:26 PM  LOS: 5 days

## 2015-08-03 NOTE — Progress Notes (Signed)
Found patient standing beside of the bed, had urinated on the floor, unstable on his feet, bed alarm was on, patient had been instructed to call for any assistance. Patient commenting that we are not giving him his pain medications like he wants, have told patient that I can only give me his medications as instructed, patient is groggy, irritable. Having to tell patient to please let us help him get back to bed so we can place his O2 back on. Patient states he knows what is good for him and then suddenly jumps up to get back to bed. O2 applied and emotional support given.

## 2015-08-03 NOTE — Progress Notes (Signed)
Paged Dr. Wendee Beavers pt has crackles upper and lower lobe right side and on NS @ 100, does he want to dc.  Dr. Wendee Beavers paged back and T/O dc IV fluids.

## 2015-08-03 NOTE — Progress Notes (Signed)
SUBJECTIVE:    C/o shortness of breath. Audible wheezing. IVF stopped this am by Dr. Wendee Beavers due to lung crackles. No recent CXR. No daily weights recorded. I/Os + 7L.   Maintaining NSR on po amio.   Still with hemoptysis. Asking about his pain meds.   Marland Kitchen amiodarone  400 mg Oral BID  . antiseptic oral rinse  7 mL Mouth Rinse BID  . atenolol  50 mg Oral Daily  . atorvastatin  20 mg Oral Daily  . buPROPion  150 mg Oral Daily  . fluticasone furoate-vilanterol  1 puff Inhalation Daily  . gabapentin  600 mg Oral BID  . insulin aspart  0-20 Units Subcutaneous TID WC  . insulin aspart  0-5 Units Subcutaneous QHS  . ipratropium-albuterol  3 mL Nebulization TID  . levofloxacin  750 mg Oral Q24H  . nitroGLYCERIN  0.5 inch Topical 3 times per day  . oxyCODONE  30 mg Oral TID  . pantoprazole  40 mg Oral Daily    PHYSICAL EXAM Filed Vitals:   08/03/15 0400 08/03/15 0500 08/03/15 0600 08/03/15 0737  BP: 148/78 146/87 168/83 154/85  Pulse: 84 88 93 87  Temp:    98.2 F (36.8 C)  TempSrc:    Oral  Resp: 13   12  Height:      Weight:      SpO2: 96% 95% 96% 95%   General:  Sitting up in bed with audible wheezing Lungs:  Crackles and wheezes throughout Heart:  RRR distant Abdomen: Obese. Distended nontender. Extremities:  Warm. Chronic venous stasis changes Neuro: A&O x 3 moves all 4  LABS:  Results for orders placed or performed during the hospital encounter of 07/28/15 (from the past 24 hour(s))  Glucose, capillary     Status: Abnormal   Collection Time: 08/02/15 12:11 PM  Result Value Ref Range   Glucose-Capillary 118 (H) 65 - 99 mg/dL   Comment 1 Capillary Specimen   Glucose, capillary     Status: Abnormal   Collection Time: 08/02/15  4:13 PM  Result Value Ref Range   Glucose-Capillary 160 (H) 65 - 99 mg/dL   Comment 1 Notify RN   Glucose, capillary     Status: Abnormal   Collection Time: 08/02/15  9:21 PM  Result Value Ref Range   Glucose-Capillary 164 (H) 65 - 99  mg/dL   Comment 1 Capillary Specimen   Basic metabolic panel     Status: Abnormal   Collection Time: 08/03/15  6:00 AM  Result Value Ref Range   Sodium 140 135 - 145 mmol/L   Potassium 3.3 (L) 3.5 - 5.1 mmol/L   Chloride 98 (L) 101 - 111 mmol/L   CO2 26 22 - 32 mmol/L   Glucose, Bld 113 (H) 65 - 99 mg/dL   BUN 7 6 - 20 mg/dL   Creatinine, Ser 0.82 0.61 - 1.24 mg/dL   Calcium 8.7 (L) 8.9 - 10.3 mg/dL   GFR calc non Af Amer >60 >60 mL/min   GFR calc Af Amer >60 >60 mL/min   Anion gap 16 (H) 5 - 15  CBC     Status: Abnormal   Collection Time: 08/03/15  6:00 AM  Result Value Ref Range   WBC 10.7 (H) 4.0 - 10.5 K/uL   RBC 3.72 (L) 4.22 - 5.81 MIL/uL   Hemoglobin 10.2 (L) 13.0 - 17.0 g/dL   HCT 31.5 (L) 39.0 - 52.0 %   MCV 84.7 78.0 - 100.0 fL  MCH 27.4 26.0 - 34.0 pg   MCHC 32.4 30.0 - 36.0 g/dL   RDW 14.4 11.5 - 15.5 %   Platelets 206 150 - 400 K/uL  Glucose, capillary     Status: Abnormal   Collection Time: 08/03/15  7:41 AM  Result Value Ref Range   Glucose-Capillary 129 (H) 65 - 99 mg/dL   Comment 1 Capillary Specimen     Intake/Output Summary (Last 24 hours) at 08/03/15 1037 Last data filed at 08/03/15 0600  Gross per 24 hour  Intake   2630 ml  Output   1600 ml  Net   1030 ml     ASSESSMENT AND PLAN:  1. Atrial fibrillation with RVR ([place]) - New onset in setting of pneumonia and hemoptysis. CHADVACS score of 3 (HTN, vascular disease & DM). - Now in NSR on po amio. not candidate for [facility] due to hemoptysis  2. Acute on chronic respiratory failure - suspect component of diastolic HF in setting of IVF resuscitation - check CXR, give IV lasix, daily weights - stop atenolol  3. 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 Not a candidate for anticoagulation in setting of acute hemoptysis  4. Hemoptysis -likely due to left upper lobe lung mass and left lower lobe pneumonia. -awaiting bronch next week   5. Tobacco  use - has been counseled on need to quit  5. AKI - resolved with hydration  6. HCAP - on abx per primary   7. OSA - on CPAP  8. Hypokalemia - will supp  Glori Bickers MD 08/03/2015 10:37 AM

## 2015-08-04 DIAGNOSIS — I5031 Acute diastolic (congestive) heart failure: Secondary | ICD-10-CM

## 2015-08-04 LAB — GLUCOSE, CAPILLARY
GLUCOSE-CAPILLARY: 234 mg/dL — AB (ref 65–99)
GLUCOSE-CAPILLARY: 178 mg/dL — AB (ref 65–99)
Glucose-Capillary: 101 mg/dL — ABNORMAL HIGH (ref 65–99)
Glucose-Capillary: 146 mg/dL — ABNORMAL HIGH (ref 65–99)

## 2015-08-04 LAB — POTASSIUM: Potassium: 3.1 mmol/L — ABNORMAL LOW (ref 3.5–5.1)

## 2015-08-04 MED ORDER — FUROSEMIDE 10 MG/ML IJ SOLN
80.0000 mg | Freq: Once | INTRAMUSCULAR | Status: AC
  Administered 2015-08-04: 80 mg via INTRAVENOUS
  Filled 2015-08-04: qty 8

## 2015-08-04 MED ORDER — POTASSIUM CHLORIDE CRYS ER 20 MEQ PO TBCR
40.0000 meq | EXTENDED_RELEASE_TABLET | Freq: Two times a day (BID) | ORAL | Status: AC
  Administered 2015-08-04 (×2): 40 meq via ORAL
  Filled 2015-08-04 (×2): qty 2

## 2015-08-04 MED ORDER — LEVALBUTEROL HCL 0.63 MG/3ML IN NEBU
0.6300 mg | INHALATION_SOLUTION | Freq: Three times a day (TID) | RESPIRATORY_TRACT | Status: DC
  Administered 2015-08-04 – 2015-08-13 (×21): 0.63 mg via RESPIRATORY_TRACT
  Filled 2015-08-04 (×25): qty 3

## 2015-08-04 MED ORDER — POTASSIUM CHLORIDE CRYS ER 20 MEQ PO TBCR
40.0000 meq | EXTENDED_RELEASE_TABLET | Freq: Once | ORAL | Status: AC
  Administered 2015-08-04: 40 meq via ORAL
  Filled 2015-08-04: qty 2

## 2015-08-04 MED ORDER — IPRATROPIUM BROMIDE 0.02 % IN SOLN
0.5000 mg | Freq: Three times a day (TID) | RESPIRATORY_TRACT | Status: DC
  Administered 2015-08-04 – 2015-08-13 (×21): 0.5 mg via RESPIRATORY_TRACT
  Filled 2015-08-04 (×25): qty 2.5

## 2015-08-04 NOTE — Progress Notes (Signed)
Pt wife is visiting. Per request of wife and patient, turned off bed alarm to allow wife to sit beside him on the bed. Gave clear instruction to wife that she needed to inform nursing staff before leaving, and re-educated patient on need to call for help before attempting to stand. Both state they understand, and call bell is in reach.

## 2015-08-04 NOTE — Progress Notes (Signed)
[facility] PROGRESS NOTE  [name] WPY:[id number] DOB: Apr 14, 1951 DOA: 07/28/2015 PCP: Fransisca Kaufmann Dettinger, MD  Assessment/Plan:  Atrial fibrillation - Cardiology consulted and currently managing. Pt on amiodarone and atenolol  Acute kidney injury.  - resolved with IVF rehydration. Most likely prerenal cause  Acute respiratory failure - Multifactorial. Suspect patient is fluid overloaded. Agree with continuing lasix. Chest x ray reviewed. - will reassess BMP and monitor K levels.   Hemoptysis. Chest pain  Patient was seen by Dr. Lamonte Sakai of critical care last hospitalization, he continues to have hemoptysis and was supposed to undergo fiberoptic bronchoscopy   - Pt to undergo the biopsy for an official diagnosis. - Awaiting bronchoscopy. Procedure held secondary to new onset of atrial fibrillation   HCAP  - currently on oral antibiotic.   Pleuritic pain. Suspect secondary to the left upper lobe lung mass and left lower lobe pneumonia. Point was negative, EKG without evidence of ST elevation or depression. For now we'll continue to monitor on telemetry.    Iron deficiency anemia. Hemoglobin appears to be relatively stable.    Recent cervical collar fracture occurred about 6 weeks ago. Continues on oxycodone 30 mg twice a day and will change Percocet to 7-325 every 6 hours when necessary as patient complaining of discomfort   Obstructive sleep apnea. CPAP daily at bedtime   Hyperlipidemia.  Continue atorvastatin 20 mg daily   Depression/anxiety. continue Wellbutrin 150 mg daily, Will add Ativan  Tobacco abuse. Topical NicoDerm 21 mg, smoking cessation advised  11. GERD.  Protonix 40 mg daily  12. DVT prophylaxis. SCDs given the hemoptysis  Code Status: DNR Family Communication: Wife at bedside.  Disposition Plan: Remain inpatient.    Consultants:  Pulmonary   Procedures:  none  Antibiotics:  Zosyn 4-10  HPI/Subjective: No new complaints reported. No  acute issues reported overnight.  Objective: Filed Vitals:   08/04/15 0815 08/04/15 1006  BP: 139/74 148/71  Pulse: 79 83  Temp: 98.1 F (36.7 C)   Resp: 12 19    Intake/Output Summary (Last 24 hours) at 08/04/15 1156 Last data filed at 08/04/15 1000  Gross per 24 hour  Intake   1120 ml  Output   1500 ml  Net   -380 ml   Filed Weights   07/28/15 2058 07/29/15 0235 08/04/15 0318  Weight: 91.4 kg (201 lb 8 oz) 92.1 kg (203 lb 0.7 oz) 90.447 kg (199 lb 6.4 oz)    Exam:   General:  NAD, alert and awake  Cardiovascular: S 1, S 2 RRR  Respiratory: rhales, no wheezes, equal chest rise.  Abdomen: BS present, soft, nt  Musculoskeletal: no edema.   Data Reviewed: Basic Metabolic Panel:  Recent Labs Lab 07/28/15 2141 07/29/15 0518 07/30/15 0654 07/31/15 0242 08/03/15 0600 08/04/15 0957  NA 133* 134* 134* 134* 140  --   K 4.4 4.5 4.1 3.7 3.3* 3.1*  CL 94* 97* 99* 99* 98*  --   CO2 '23 22 22 24 26  '$ --   GLUCOSE 74 94 182* 158* 113*  --   BUN 40* 44* 27* 10 7  --   CREATININE 2.48* 2.26* 1.28* 0.90 0.82  --   CALCIUM 9.1 8.5* 9.1 9.0 8.7*  --    Liver Function Tests:  Recent Labs Lab 07/28/15 2141  AST 37  ALT 21  ALKPHOS 68  BILITOT 0.7  PROT 7.0  ALBUMIN 3.3*    Recent Labs Lab 07/28/15 2141  LIPASE 18  No results for input(s): AMMONIA in the last 168 hours. CBC:  Recent Labs Lab 07/28/15 2141 07/29/15 0518 07/30/15 0654 07/31/15 0242 07/31/15 2355 08/03/15 0600  WBC 17.9* 14.6* 15.3* 14.4* 13.3* 10.7*  NEUTROABS 13.9* 11.0*  --  12.4*  --   --   HGB 10.5* 10.1* 10.1* 9.8* 9.4* 10.2*  HCT 32.6* 31.3* 31.6* 30.6* 28.2* 31.5*  MCV 85.1 85.1 86.6 84.5 85.5 84.7  PLT 177 159 161 154 155 206   Cardiac Enzymes:  Recent Labs Lab 07/29/15 1714 07/29/15 2105 07/30/15 2142 07/31/15 0242 07/31/15 0935  TROPONINI 0.06* <0.03 <0.03 0.03 0.04*   BNP (last 3 results)  Recent Labs  07/23/15 2241  BNP 12.0    ProBNP (last 3  results) No results for input(s): PROBNP in the last 8760 hours.  CBG:  Recent Labs Lab 08/03/15 0741 08/03/15 1133 08/03/15 1625 08/03/15 2213 08/04/15 0840  GLUCAP 129* 157* 94 123* 101*    Recent Results (from the past 240 hour(s))  Culture, blood (routine x 2) Call MD if unable to obtain prior to antibiotics being given     Status: None   Collection Time: 07/29/15  1:37 AM  Result Value Ref Range Status   Specimen Description BLOOD RIGHT ARM  Final   Special Requests BOTTLES DRAWN AEROBIC ONLY 5CC  Final   Culture NO GROWTH 5 DAYS  Final   Report Status 08/03/2015 FINAL  Final  Culture, blood (routine x 2) Call MD if unable to obtain prior to antibiotics being given     Status: None   Collection Time: 07/29/15  1:42 AM  Result Value Ref Range Status   Specimen Description BLOOD RIGHT ARM  Final   Special Requests BOTTLES DRAWN AEROBIC ONLY 5CC  Final   Culture NO GROWTH 5 DAYS  Final   Report Status 08/03/2015 FINAL  Final  Culture, sputum-assessment     Status: None   Collection Time: 07/29/15  7:34 AM  Result Value Ref Range Status   Specimen Description SPUTUM  Final   Special Requests NONE  Final   Sputum evaluation   Final    THIS SPECIMEN IS ACCEPTABLE. RESPIRATORY CULTURE REPORT TO FOLLOW.   Report Status 07/29/2015 FINAL  Final  Culture, respiratory (NON-Expectorated)     Status: None   Collection Time: 07/29/15  8:20 AM  Result Value Ref Range Status   Specimen Description SPUTUM  Final   Special Requests NONE  Final   Gram Stain   Final    ABUNDANT WBC PRESENT,BOTH PMN AND MONONUCLEAR FEW SQUAMOUS EPITHELIAL CELLS PRESENT NO ORGANISMS SEEN Performed at [facility]    Culture   Final    FEW CANDIDA ALBICANS Performed at [facility]    Report Status 07/31/2015 FINAL  Final     Studies: Dg Chest Port 1 View  08/03/2015  CLINICAL DATA:  65 year old male with shortness of breath and wheezing EXAM: PORTABLE CHEST 1 VIEW COMPARISON:   Prior chest x-ray 07/28/2015; prior chest CT 07/24/2015 FINDINGS: Interval development of diffuse bilateral predominantly interstitial opacities. There is more dense opacifications in the left base obscuring the heart margin and diaphragm. Mediastinal fullness with bulky nodular opacities in the right paratracheal stripe are similar compared to prior. Left lower lobe pulmonary nodules or now obscured. No acute osseous abnormality. IMPRESSION: 1. Interval development of mild pulmonary edema concerning for volume overload versus CHF. 2. Possible left pleural effusion. 3. Similar appearance of mediastinal and left hilar adenopathy. The known  left lower lobe pulmonary nodules are now obscured from view by the edema and possible left-sided pleural effusion. Electronically Signed   By: Jacqulynn Cadet M.D.   On: 08/03/2015 11:35    Scheduled Meds: . amiodarone  400 mg Oral BID  . antiseptic oral rinse  7 mL Mouth Rinse BID  . atorvastatin  20 mg Oral Daily  . buPROPion  150 mg Oral Daily  . fluticasone furoate-vilanterol  1 puff Inhalation Daily  . furosemide  80 mg Intravenous Once  . gabapentin  600 mg Oral BID  . insulin aspart  0-20 Units Subcutaneous TID WC  . insulin aspart  0-5 Units Subcutaneous QHS  . ipratropium-albuterol  3 mL Nebulization TID  . levofloxacin  750 mg Oral Q24H  . nitroGLYCERIN  0.5 inch Topical 3 times per day  . oxyCODONE  30 mg Oral TID  . pantoprazole  40 mg Oral Daily  . potassium chloride  40 mEq Oral BID  . potassium chloride  40 mEq Oral Once   Continuous Infusions:    Active Problems:   Hemoptysis   AKI (acute kidney injury) ([place])   Atrial fibrillation with RVR (H[facility])   Tobacco use disorder   Shortness of breath  Time spent: 25 minutes.    Velvet Bathe  [facility] Pager [phone]. If 7PM-7AM, please contact night-coverage at [email or web address], password [facility] 08/04/2015, 11:56 AM  LOS: 6 days

## 2015-08-04 NOTE — Progress Notes (Signed)
SUBJECTIVE:    Started on IV lasix yesterday for acute decompensation. CXR with diffuse pulmonary edema.   Breathing better today but still with some wheezing. Lasix continued. Still with hemoptysis. Remains in NSR.     Marland Kitchen amiodarone  400 mg Oral BID  . antiseptic oral rinse  7 mL Mouth Rinse BID  . atorvastatin  20 mg Oral Daily  . buPROPion  150 mg Oral Daily  . fluticasone furoate-vilanterol  1 puff Inhalation Daily  . furosemide  80 mg Intravenous Once  . gabapentin  600 mg Oral BID  . insulin aspart  0-20 Units Subcutaneous TID WC  . insulin aspart  0-5 Units Subcutaneous QHS  . ipratropium-albuterol  3 mL Nebulization TID  . levofloxacin  750 mg Oral Q24H  . nitroGLYCERIN  0.5 inch Topical 3 times per day  . oxyCODONE  30 mg Oral TID  . pantoprazole  40 mg Oral Daily    PHYSICAL EXAM Filed Vitals:   08/04/15 0815 08/04/15 0834 08/04/15 0835 08/04/15 1006  BP: 139/74   148/71  Pulse: 79   83  Temp: 98.1 F (36.7 C)     TempSrc: Oral     Resp: 12   19  Height:      Weight:      SpO2: 95% 99% 95% 97%   General:  Sitting up in bed on CPAP Lungs:  Mild crackles  and wheezes throughout Heart:  RRR distant Abdomen: Obese. Distended nontender. Extremities:  Warm. Chronic venous stasis changes Neuro: A&O x 3 moves all 4  LABS:  Results for orders placed or performed during the hospital encounter of 07/28/15 (from the past 24 hour(s))  Glucose, capillary     Status: Abnormal   Collection Time: 08/03/15 11:33 AM  Result Value Ref Range   Glucose-Capillary 157 (H) 65 - 99 mg/dL   Comment 1 Capillary Specimen   Glucose, capillary     Status: None   Collection Time: 08/03/15  4:25 PM  Result Value Ref Range   Glucose-Capillary 94 65 - 99 mg/dL   Comment 1 Capillary Specimen   Glucose, capillary     Status: Abnormal   Collection Time: 08/03/15 10:13 PM  Result Value Ref Range   Glucose-Capillary 123 (H) 65 - 99 mg/dL  Glucose, capillary     Status: Abnormal   Collection Time: 08/04/15  8:40 AM  Result Value Ref Range   Glucose-Capillary 101 (H) 65 - 99 mg/dL   Comment 1 Capillary Specimen     Intake/Output Summary (Last 24 hours) at 08/04/15 1101 Last data filed at 08/04/15 1000  Gross per 24 hour  Intake   1120 ml  Output   1500 ml  Net   -380 ml     ASSESSMENT AND PLAN:  1. Atrial fibrillation with RVR ([place]) - New onset in setting of pneumonia and hemoptysis. CHADVACS score of 3 (HTN, vascular disease & DM). - Now in NSR on po amio. not candidate for [facility] due to hemoptysis  2. Acute on chronic respiratory failure - suspect component of diastolic HF in setting of IVF resuscitation on top of severe COPD. No improving with diuresis - On more day IV lasix. Repeat CXR in am. Watch renal function. Supp K+ - Atenolol stopped.   3. 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 Not a candidate for anticoagulation in setting of acute hemoptysis  4. Hemoptysis -likely due  to left upper lobe lung mass and left lower lobe pneumonia. -awaiting bronch next week   5. Tobacco use - has been counseled on need to quit  5. AKI - resolved  6. HCAP - on abx per primary   7. OSA - on CPAP  8. Hypokalemia - will supp  Glori Bickers MD 08/04/2015 11:01 AM

## 2015-08-05 DIAGNOSIS — R0789 Other chest pain: Secondary | ICD-10-CM

## 2015-08-05 DIAGNOSIS — R0600 Dyspnea, unspecified: Secondary | ICD-10-CM

## 2015-08-05 LAB — BASIC METABOLIC PANEL
Anion gap: 14 (ref 5–15)
Anion gap: 15 (ref 5–15)
BUN: 14 mg/dL (ref 6–20)
BUN: 15 mg/dL (ref 6–20)
CHLORIDE: 93 mmol/L — AB (ref 101–111)
CO2: 28 mmol/L (ref 22–32)
CO2: 28 mmol/L (ref 22–32)
CREATININE: 1.22 mg/dL (ref 0.61–1.24)
Calcium: 8 mg/dL — ABNORMAL LOW (ref 8.9–10.3)
Calcium: 8 mg/dL — ABNORMAL LOW (ref 8.9–10.3)
Chloride: 93 mmol/L — ABNORMAL LOW (ref 101–111)
Creatinine, Ser: 1.19 mg/dL (ref 0.61–1.24)
GFR calc Af Amer: >60 mL/min (ref >60)
GFR calc Af Amer: >60 mL/min (ref >60)
GFR calc non Af Amer: >60 mL/min (ref >60)
GFR calc non Af Amer: >60 mL/min (ref >60)
GLUCOSE: 148 mg/dL — AB (ref 65–99)
Glucose, Bld: 136 mg/dL — ABNORMAL HIGH (ref 65–99)
POTASSIUM: 3.2 mmol/L — AB (ref 3.5–5.1)
Potassium: 3.5 mmol/L (ref 3.5–5.1)
SODIUM: 136 mmol/L (ref 135–145)
Sodium: 135 mmol/L (ref 135–145)

## 2015-08-05 LAB — GLUCOSE, CAPILLARY
GLUCOSE-CAPILLARY: 239 mg/dL — AB (ref 65–99)
Glucose-Capillary: 189 mg/dL — ABNORMAL HIGH (ref 65–99)
Glucose-Capillary: 158 mg/dL — ABNORMAL HIGH (ref 65–99)

## 2015-08-05 LAB — TROPONIN I
Troponin I: <0.03 ng/mL (ref <0.031)
Troponin I: <0.03 ng/mL (ref <0.031)

## 2015-08-05 MED ORDER — ISOSORBIDE MONONITRATE ER 60 MG PO TB24
60.0000 mg | ORAL_TABLET | Freq: Every day | ORAL | Status: DC
  Administered 2015-08-05 – 2015-08-11 (×7): 60 mg via ORAL
  Filled 2015-08-05 (×7): qty 1

## 2015-08-05 MED ORDER — FUROSEMIDE 10 MG/ML IJ SOLN
60.0000 mg | Freq: Once | INTRAMUSCULAR | Status: AC
  Administered 2015-08-05: 60 mg via INTRAVENOUS
  Filled 2015-08-05: qty 6

## 2015-08-05 NOTE — Progress Notes (Signed)
Name: [name] MRN: [id number] DOB: 10/14/1950    ADMISSION DATE:  07/28/2015 CONSULTATION DATE: 07/29/15  REFERRING MD :  Burnett Corrente  CHIEF COMPLAINT:  hemoptysis  BRIEF PATIENT DESCRIPTION: 65 yo male with Hx of HTN, Type 2 DM, OSA,tobacco abuse, prior basal cell carcinoma(2010) was admitted to [facility] from April 5 -8 for CAP and left upper lung mass. Was treated with Levaquin and flagyl and was transitioned to oral Augmentin. Patient now presents with increased cough production and  Worsening Hemoptysis.  He was supposed to undergo fiberoptic bronchoscopy with Dr. Lamonte Sakai  But not yet had a biopsy of the lung mass.  SIGNIFICANT EVENTS  4/10> Readmitted  With HCAP and Hemoptysis   4/11 Developed Afib with RVR  STUDIES:   4/5 >>CT chest was concerning for Extensive masslike nodular opacities in the left lower lobe, with a confluent infrahilar mass measuring 3.2 x 3.2 cm, many of whichare contiguous with left hilar soft tissue density. There is extensive multifocal mediastinal adenopathy. Findings consistentwith malignancy, with primary likely bronchogenic in the left lowerlobe. These masslike opacities encase the left lower lobe pulmonary artery leads luminal narrowing but no discrete invasion or frankpulmonary embolus. Multiple ground-glass opacities in the left lowerlobe, may be pneumonitis, postobstructive atelectasis or spread of malignancy. Additional lingular nodule measures 1.5 cm.3. Bubbly density in the left mainstem bronchus and left lower lobe,with left lung bronchial thickening. This is likely secretions or aspiration.4. Moderate emphysema.    SUBJECTIVE:  Now NSR, still having mild hemoptysis. Still SOB with exertion, now up to 4L [place].  VITAL SIGNS: Temp:  [97.6 F (36.4 C)-98.7 F (37.1 C)] 98.7 F (37.1 C) (04/17 0833) Pulse Rate:  [85-93] 90 (04/17 0833) Resp:  [12-20] 12 (04/17 0833) BP: (122-154)/(65-82) 122/65 mmHg (04/17 0833) SpO2:  [80 %-97 %] 93 % (04/17  0833) Weight:  [92.126 kg (203 lb 1.6 oz)] 92.126 kg (203 lb 1.6 oz) (04/17 0300)  PHYSICAL EXAMINATION: General:  Sickly appearing , elderly white male Neuro:  Awake, alert, oriented,  HEENT:  HOH, atraumatic, normocephalic,no disharge Cardiovascular:  NSR now, no MRG Lungs: Diminished bases, coarse, mild expiratory wheeze Abdomen: soft, nontender, BS+ Musculoskeletal:  No deformity or inflamation noted Skin:  Grossly intact and dry   Recent Labs Lab 07/31/15 0242 08/03/15 0600 08/04/15 0957 08/04/15 2338  NA 134* 140  --  135  K 3.7 3.3* 3.1* 3.5  CL 99* 98*  --  93*  CO2 24 26  --  28  BUN 10 7  --  15  CREATININE 0.90 0.82  --  1.19  GLUCOSE 158* 113*  --  136*    Recent Labs Lab 07/31/15 0242 07/31/15 2355 08/03/15 0600  HGB 9.8* 9.4* 10.2*  HCT 30.6* 28.2* 31.5*  WBC 14.4* 13.3* 10.7*  PLT 154 155 206   No results found.  ASSESSMENT / PLAN:  65 year old male with tobacco abuse history presenting with hemoptysis and overnight developed a-fib with RVR that is paroxysmal.  Hemoptysis persists so no anticoagulation.  On exam, hemoptysis persists with coarse BS diffusely. AF chemically converted to NSR now, but remains paroxysmal  Acute hypoxemic respiratory failure - now requiring 4L O2, 3 weeks ago needed none Hemoptysis - improving, but not resolved - Monitor. - Hold anticoagulation. - Repeat CXR in AM - Recommend continuing diuresis as renal function will tolerate   A-fib, paroxysmal: - No anticoagulation with hemoptysis  - Amiodarone drip to prevent paroxysm since we can not anti-coag  and high risk of CVA. - Cardiology following  Lungs mass: - He and wife are going to decide if he would want cancer treatment - Will decide on FOB if he would want chemotherapy/radiation if indicated.  Tobacco abuse: - Smoking cessation.  Georgann Housekeeper, AGACNP-BC [facility] Pulmonology/Critical Care Pager [phone] or [phone]  08/05/2015 11:28 AM

## 2015-08-05 NOTE — Progress Notes (Addendum)
[place] Progress Note Patient Name: [name] DOB: 1950-05-16 MRN: [id number]   Date of Service  08/05/2015  HPI/Events of Note  K 3.2, chest discomfort  eICU Interventions  K repleted Morphine 2 mg once     Intervention Category Minor Interventions: Electrolytes abnormality - evaluation and management  Doreather Hoxworth 08/05/2015, 11:59 PM

## 2015-08-05 NOTE — Progress Notes (Signed)
OT Cancellation Note  Patient Details Name: [name] MRN: [id number] DOB: 24-Oct-1950   Cancelled Treatment:    Reason Eval/Treat Not Completed: Other (comment) (just finished with PT session)  Vonita Moss   OTR/L Pager: [phone] Office: [phone] .  08/05/2015, 3:53 PM

## 2015-08-05 NOTE — Progress Notes (Addendum)
       Patient Name: [name] Date of Encounter: 08/05/2015    SUBJECTIVE: C/O chest tightness and currently in NSR.  TELEMETRY:  Sinus rhythm Filed Vitals:   08/05/15 0600 08/05/15 0700 08/05/15 0740 08/05/15 0833  BP:    122/65  Pulse: 86 89  90  Temp:    98.7 F (37.1 C)  TempSrc:    Oral  Resp: '17 12  12  '$ Height:      Weight:      SpO2: 97% 88% 95% 93%    Intake/Output Summary (Last 24 hours) at 08/05/15 1017 Last data filed at 08/04/15 2200  Gross per 24 hour  Intake    900 ml  Output   1100 ml  Net   -200 ml   LABS: Basic Metabolic Panel:  Recent Labs  08/03/15 0600 08/04/15 0957 08/04/15 2338  NA 140  --  135  K 3.3* 3.1* 3.5  CL 98*  --  93*  CO2 26  --  28  GLUCOSE 113*  --  136*  BUN 7  --  15  CREATININE 0.82  --  1.19  CALCIUM 8.7*  --  8.0*   CBC:  Recent Labs  08/03/15 0600  WBC 10.7*  HGB 10.2*  HCT 31.5*  MCV 84.7  PLT 206     Radiology/Studies:  08/03/15 CXR showed volume overload.  Physical Exam: Blood pressure 122/65, pulse 90, temperature 98.7 F (37.1 C), temperature source Oral, resp. rate 12, height '5\' 11"'$  (1.803 m), weight 203 lb 1.6 oz (92.126 kg), SpO2 93 %. Weight change: 3 lb 11.2 oz (1.678 kg)  Wt Readings from Last 3 Encounters:  08/05/15 203 lb 1.6 oz (92.126 kg)  07/24/15 210 lb 12.8 oz (95.618 kg)  07/18/15 205 lb 6.4 oz (93.169 kg)   Chest with rales and rhonchi both lung fields. Cardiac reveals no rub or gallop. Extremities reveal no edema.  ASSESSMENT:  1. Paroxysmal atril fibrillation 2. Lung mass and hemoptysis 3. Chest pressure 4. DM, II 5. Tobacco use 6. H/O non-obstructive CAD 7. Dyspnea and edema on chest x-ray  Plan:  1. Continue amiodarone for suppression of AF 2. Increase LA nitrates 3. Recycle cardiac markers 4. IV Lasix 5. Echocardiogram  Signed, Sinclair Grooms 08/05/2015, 10:17 AM

## 2015-08-05 NOTE — Progress Notes (Signed)
[facility] PROGRESS NOTE  [name] VZS:[id number] DOB: November 11, 1950 DOA: 07/28/2015 PCP: Fransisca Kaufmann Dettinger, MD  Assessment/Plan:  Atrial fibrillation - Cardiology consulted and currently managing. Pt on amiodarone and atenolol  Acute kidney injury.  - resolved with IVF rehydration. Most likely prerenal cause  Acute respiratory failure - Multifactorial. Suspect patient is fluid overloaded. Agree with continuing lasix. Chest x ray reviewed. - will reassess BMP and monitor K levels.   Hemoptysis. Chest pain  Patient was seen by Dr. Lamonte Sakai of critical care last hospitalization, he continues to have hemoptysis and was supposed to undergo fiberoptic bronchoscopy   - Pt unsure whether or not to get tissue biopsy at this time. He is currently deciding whether or not to undergo bronchoscopy.   HCAP  - currently on oral antibiotic.   Pleuritic pain. Suspect secondary to the left upper lobe lung mass and left lower lobe pneumonia. Point was negative, EKG without evidence of ST elevation or depression. For now we'll continue to monitor on telemetry.    Iron deficiency anemia. Hemoglobin appears to be relatively stable.    Recent cervical collar fracture occurred about 6 weeks ago. Continues on oxycodone 30 mg twice a day and will change Percocet to 7-325 every 6 hours when necessary as patient complaining of discomfort   Obstructive sleep apnea. CPAP daily at bedtime   Hyperlipidemia.  Continue atorvastatin 20 mg daily   Depression/anxiety. continue Wellbutrin 150 mg daily, Will add Ativan  Tobacco abuse. Topical NicoDerm 21 mg, smoking cessation advised  11. GERD.  Protonix 40 mg daily  12. DVT prophylaxis. SCDs given the hemoptysis  Code Status: DNR Family Communication: Wife at bedside.  Disposition Plan: Remain inpatient.    Consultants:  Pulmonary   Procedures:  none  Antibiotics:  Zosyn 4-10  HPI/Subjective: No new complaints  currently.  Objective: Filed Vitals:   08/05/15 1200 08/05/15 1214  BP:  146/85  Pulse: 94 93  Temp:  98.5 F (36.9 C)  Resp: 15 15    Intake/Output Summary (Last 24 hours) at 08/05/15 1546 Last data filed at 08/05/15 1400  Gross per 24 hour  Intake    980 ml  Output    450 ml  Net    530 ml   Filed Weights   07/29/15 0235 08/04/15 0318 08/05/15 0300  Weight: 92.1 kg (203 lb 0.7 oz) 90.447 kg (199 lb 6.4 oz) 92.126 kg (203 lb 1.6 oz)    Exam:   General:  NAD, alert and awake  Cardiovascular: S1, S 2 RRR  Respiratory: rhales, no wheezes, equal chest rise.  Abdomen: BS present, soft, nt  Musculoskeletal: no edema.   Data Reviewed: Basic Metabolic Panel:  Recent Labs Lab 07/30/15 0654 07/31/15 0242 08/03/15 0600 08/04/15 0957 08/04/15 2338  NA 134* 134* 140  --  135  K 4.1 3.7 3.3* 3.1* 3.5  CL 99* 99* 98*  --  93*  CO2 '22 24 26  '$ --  28  GLUCOSE 182* 158* 113*  --  136*  BUN 27* 10 7  --  15  CREATININE 1.28* 0.90 0.82  --  1.19  CALCIUM 9.1 9.0 8.7*  --  8.0*   Liver Function Tests: No results for input(s): AST, ALT, ALKPHOS, BILITOT, PROT, ALBUMIN in the last 168 hours. No results for input(s): LIPASE, AMYLASE in the last 168 hours. No results for input(s): AMMONIA in the last 168 hours. CBC:  Recent Labs Lab 07/30/15 0654 07/31/15 0242 07/31/15 2355 08/03/15 0600  WBC 15.3* 14.4* 13.3* 10.7*  NEUTROABS  --  12.4*  --   --   HGB 10.1* 9.8* 9.4* 10.2*  HCT 31.6* 30.6* 28.2* 31.5*  MCV 86.6 84.5 85.5 84.7  PLT 161 154 155 206   Cardiac Enzymes:  Recent Labs Lab 07/29/15 2105 07/30/15 2142 07/31/15 0242 07/31/15 0935 08/05/15 1040  TROPONINI <0.03 <0.03 0.03 0.04* <0.03   BNP (last 3 results)  Recent Labs  07/23/15 2241  BNP 12.0    ProBNP (last 3 results) No results for input(s): PROBNP in the last 8760 hours.  CBG:  Recent Labs Lab 08/04/15 1155 08/04/15 1657 08/04/15 2123 08/05/15 0833 08/05/15 1214  GLUCAP 146*  234* 178* 189* 239*    Recent Results (from the past 240 hour(s))  Culture, blood (routine x 2) Call MD if unable to obtain prior to antibiotics being given     Status: None   Collection Time: 07/29/15  1:37 AM  Result Value Ref Range Status   Specimen Description BLOOD RIGHT ARM  Final   Special Requests BOTTLES DRAWN AEROBIC ONLY 5CC  Final   Culture NO GROWTH 5 DAYS  Final   Report Status 08/03/2015 FINAL  Final  Culture, blood (routine x 2) Call MD if unable to obtain prior to antibiotics being given     Status: None   Collection Time: 07/29/15  1:42 AM  Result Value Ref Range Status   Specimen Description BLOOD RIGHT ARM  Final   Special Requests BOTTLES DRAWN AEROBIC ONLY 5CC  Final   Culture NO GROWTH 5 DAYS  Final   Report Status 08/03/2015 FINAL  Final  Culture, sputum-assessment     Status: None   Collection Time: 07/29/15  7:34 AM  Result Value Ref Range Status   Specimen Description SPUTUM  Final   Special Requests NONE  Final   Sputum evaluation   Final    THIS SPECIMEN IS ACCEPTABLE. RESPIRATORY CULTURE REPORT TO FOLLOW.   Report Status 07/29/2015 FINAL  Final  Culture, respiratory (NON-Expectorated)     Status: None   Collection Time: 07/29/15  8:20 AM  Result Value Ref Range Status   Specimen Description SPUTUM  Final   Special Requests NONE  Final   Gram Stain   Final    ABUNDANT WBC PRESENT,BOTH PMN AND MONONUCLEAR FEW SQUAMOUS EPITHELIAL CELLS PRESENT NO ORGANISMS SEEN Performed at [facility]    Culture   Final    FEW CANDIDA ALBICANS Performed at [facility]    Report Status 07/31/2015 FINAL  Final     Studies: No results found.  Scheduled Meds: . amiodarone  400 mg Oral BID  . antiseptic oral rinse  7 mL Mouth Rinse BID  . atorvastatin  20 mg Oral Daily  . buPROPion  150 mg Oral Daily  . fluticasone furoate-vilanterol  1 puff Inhalation Daily  . gabapentin  600 mg Oral BID  . insulin aspart  0-20 Units Subcutaneous TID WC   . insulin aspart  0-5 Units Subcutaneous QHS  . ipratropium  0.5 mg Nebulization TID  . isosorbide mononitrate  60 mg Oral Daily  . levalbuterol  0.63 mg Nebulization TID  . levofloxacin  750 mg Oral Q24H  . oxyCODONE  30 mg Oral TID  . pantoprazole  40 mg Oral Daily   Continuous Infusions:    Active Problems:   Hemoptysis   AKI (acute kidney injury) ([place])   Atrial fibrillation with RVR ([facility])   Tobacco use disorder  Shortness of breath   Dyspnea  Time spent: 25 minutes.    Velvet Bathe  [facility] Pager [phone]. If 7PM-7AM, please contact night-coverage at [email or web address], password [facility] 08/05/2015, 3:46 PM  LOS: 7 days

## 2015-08-05 NOTE — Progress Notes (Signed)
Physical Therapy Treatment Patient Details Name: [name] MRN: [id number] DOB: 23-Feb-1951 Today's Date: 08/05/2015    History of Present Illness 65 y.o. male admitted to [facility] on 07/28/15 with chest pain and hemoptysis.  Dx with acute kidney injury, hemoptysis (will undergo bronchoscopy with possible biopsy 07/31/15), HCAP, and pleuritis chest pain.  Pt with significant PMhx of collar bone fx ~6 weeks prior to admission, essential HTN, DM2, Stevens-Johnson syndrome, PAD, neuropathy, neck surgery, back surgery, and basal cell carcinoma (2010).      PT Comments    Patient lethargic however wanting to get up to move around. Required incr cues for safety. Ambulated 30 feet with RW and [place] O2; SaO2 89-93%.   Follow Up Recommendations  Home health PT;Supervision/Assistance - 24 hour     Equipment Recommendations  Other (comment) (rollator- 4 wheels and a seat)    Recommendations for Other Services       Precautions / Restrictions Precautions Precautions: Other (comment);Fall Precaution Comments: monitor O2 sats Restrictions Weight Bearing Restrictions: No    Mobility  Bed Mobility Overal bed mobility: Needs Assistance Bed Mobility: Supine to Sit;Sit to Supine;Rolling Rolling: Supervision   Supine to sit: Min guard Sit to supine: Supervision   General bed mobility comments: moves impulsively to come to sit EOB  Transfers Overall transfer level: Needs assistance Equipment used: 1 person hand held assist Transfers: Sit to/from Stand Sit to Stand: Min assist         General transfer comment: x2; vc for safe use of RW; assist to stabilize RW   Ambulation/Gait Ambulation/Gait assistance: Min guard Ambulation Distance (Feet): 30 Feet Assistive device: Rolling walker (2 wheeled) Gait Pattern/deviations: Step-through pattern;Decreased stride length;Trunk flexed Gait velocity: decreased Gait velocity interpretation: Below normal speed for age/gender General Gait Details:  limited by lines and SaO2   Stairs            Wheelchair Mobility    Modified Rankin (Stroke Patients Only)       Balance Overall balance assessment: Needs assistance Sitting-balance support: No upper extremity supported;Feet supported Sitting balance-Leahy Scale: Good     Standing balance support: Bilateral upper extremity supported Standing balance-Leahy Scale: Poor Standing balance comment: lethargic                    Cognition Arousal/Alertness: Lethargic;Suspect due to medications (RN reports had pain medicine ~45 min prior) Behavior During Therapy: Restless Overall Cognitive Status: Impaired/Different from baseline Area of Impairment: Attention;Safety/judgement;Awareness;Problem solving   Current Attention Level: Sustained     Safety/Judgement: Decreased awareness of safety;Decreased awareness of deficits Awareness: Intellectual Problem Solving: Slow processing;Difficulty sequencing;Requires verbal cues;Requires tactile cues General Comments: sitting EOB attempting to put Morgan back in his nose without success    Exercises      General Comments        Pertinent Vitals/Pain Pain Assessment: Faces Faces Pain Scale: Hurts even more Pain Location: chest with coughing Pain Descriptors / Indicators: Grimacing;Discomfort Pain Intervention(s): Limited activity within patient's tolerance;Monitored during session;Repositioned    Home Living                      Prior Function            PT Goals (current goals can now be found in the care plan section) Acute Rehab PT Goals Patient Stated Goal: to feel better PT Goal Formulation: With patient/family Time For Goal Achievement: 08/13/15 Potential to Achieve Goals: Good Progress towards PT goals: Progressing toward  goals    Frequency  Min 3X/week    PT Plan Current plan remains appropriate    Co-evaluation             End of Session Equipment Utilized During Treatment:  Oxygen;Gait belt Activity Tolerance: Patient limited by fatigue;Patient limited by lethargy;Treatment limited secondary to medical complications (Comment) (SaO2) Patient left: with call bell/phone within reach;in bed;with bed alarm set     Time: 1856-3149 PT Time Calculation (min) (ACUTE ONLY): 22 min  Charges:  $Gait Training: 8-22 mins                    G Codes:      Hollie Wojahn 08-12-15, 3:21 PM Pager [phone]

## 2015-08-06 ENCOUNTER — Inpatient Hospital Stay (HOSPITAL_COMMUNITY): Payer: Medicare Other

## 2015-08-06 ENCOUNTER — Other Ambulatory Visit: Payer: Self-pay

## 2015-08-06 DIAGNOSIS — J189 Pneumonia, unspecified organism: Secondary | ICD-10-CM

## 2015-08-06 DIAGNOSIS — R599 Enlarged lymph nodes, unspecified: Secondary | ICD-10-CM

## 2015-08-06 DIAGNOSIS — K148 Other diseases of tongue: Secondary | ICD-10-CM

## 2015-08-06 DIAGNOSIS — R071 Chest pain on breathing: Secondary | ICD-10-CM

## 2015-08-06 DIAGNOSIS — E119 Type 2 diabetes mellitus without complications: Secondary | ICD-10-CM

## 2015-08-06 DIAGNOSIS — E876 Hypokalemia: Secondary | ICD-10-CM

## 2015-08-06 DIAGNOSIS — I5030 Unspecified diastolic (congestive) heart failure: Secondary | ICD-10-CM

## 2015-08-06 DIAGNOSIS — Z85828 Personal history of other malignant neoplasm of skin: Secondary | ICD-10-CM

## 2015-08-06 DIAGNOSIS — I480 Paroxysmal atrial fibrillation: Secondary | ICD-10-CM

## 2015-08-06 DIAGNOSIS — I509 Heart failure, unspecified: Secondary | ICD-10-CM

## 2015-08-06 DIAGNOSIS — I10 Essential (primary) hypertension: Secondary | ICD-10-CM

## 2015-08-06 DIAGNOSIS — Z720 Tobacco use: Secondary | ICD-10-CM

## 2015-08-06 LAB — GLUCOSE, CAPILLARY
GLUCOSE-CAPILLARY: 150 mg/dL — AB (ref 65–99)
Glucose-Capillary: 142 mg/dL — ABNORMAL HIGH (ref 65–99)
Glucose-Capillary: 226 mg/dL — ABNORMAL HIGH (ref 65–99)
Glucose-Capillary: 211 mg/dL — ABNORMAL HIGH (ref 65–99)

## 2015-08-06 LAB — ECHOCARDIOGRAM COMPLETE
Height: 71 in
WEIGHTICAEL: 3208 [oz_av]

## 2015-08-06 LAB — BRAIN NATRIURETIC PEPTIDE: B Natriuretic Peptide: 11.6 pg/mL (ref 0.0–100.0)

## 2015-08-06 MED ORDER — POTASSIUM CHLORIDE 20 MEQ/15ML (10%) PO SOLN
40.0000 meq | Freq: Two times a day (BID) | ORAL | Status: DC
  Administered 2015-08-06: 40 meq
  Filled 2015-08-06: qty 30

## 2015-08-06 MED ORDER — MORPHINE SULFATE (PF) 2 MG/ML IV SOLN
2.0000 mg | INTRAVENOUS | Status: DC | PRN
  Administered 2015-08-06 – 2015-08-09 (×26): 2 mg via INTRAVENOUS
  Filled 2015-08-06 (×29): qty 1

## 2015-08-06 MED ORDER — MORPHINE SULFATE (PF) 2 MG/ML IV SOLN
2.0000 mg | Freq: Once | INTRAVENOUS | Status: AC
  Administered 2015-08-06: 2 mg via INTRAVENOUS
  Filled 2015-08-06: qty 1

## 2015-08-06 MED ORDER — POTASSIUM CHLORIDE CRYS ER 20 MEQ PO TBCR
40.0000 meq | EXTENDED_RELEASE_TABLET | Freq: Once | ORAL | Status: AC
  Administered 2015-08-06: 40 meq via ORAL
  Filled 2015-08-06: qty 2

## 2015-08-06 MED ORDER — MORPHINE SULFATE (PF) 2 MG/ML IV SOLN
2.0000 mg | INTRAVENOUS | Status: DC | PRN
  Administered 2015-08-06 (×4): 2 mg via INTRAVENOUS
  Filled 2015-08-06 (×3): qty 1

## 2015-08-06 MED ORDER — MORPHINE SULFATE (PF) 2 MG/ML IV SOLN
INTRAVENOUS | Status: AC
  Filled 2015-08-06: qty 1

## 2015-08-06 MED ORDER — POTASSIUM CHLORIDE CRYS ER 20 MEQ PO TBCR
40.0000 meq | EXTENDED_RELEASE_TABLET | Freq: Two times a day (BID) | ORAL | Status: AC
  Administered 2015-08-06 (×2): 40 meq via ORAL
  Filled 2015-08-06 (×2): qty 2

## 2015-08-06 NOTE — Consult Note (Signed)
Goals of care consult at bedside with patient and significant other, [name].  [name] is alert and oriented sitting up in a chair.  He appears weak but denies pain or SOB, O2 in use, VSS at this time.    We discussed the concepts of Palliative care and the desire to identify goals of care in light of the recent dx of lung mass and this hospitalization.  He has significant medical history (see H&P).  He is a retired Engineer, structural.  He and [name] verbalize good understanding of the information they have received regarding his dx.  [name] is able to articulate with good insight his refusal of bronchoscopy with biopsy.  He does not wish to have chemotherapy or invasive treatment for the lung mass.  He verbalizes that he is not afraid of dying but that he DOES have great fear of suffering. [name] is currently sharing care giving of her sister who was dx with lung cancer about 2 years ago.  She and [name] have had many discussions about the risks and benefits associated with treatment options.  Goals verbalized today are to be able to return home and spend time with [name] and their dogs.  He is willing to treat the treatable and continue to manage his condition medically with his physicians.  We briefly discussed the benefits of hospice services although I don't think that referral would be appropriate at this time.  More information regarding prognosis and medical management is needed.  I will meet with them again tomorrow and explore further the longer term goals of care and answer questions regarding transition home when ready.  Kizzie Fantasia, RN-BC, MSN, [facility] Palliative Care

## 2015-08-06 NOTE — Progress Notes (Signed)
Name: [name] MRN: [id number] DOB: 11/02/1950    ADMISSION DATE:  07/28/2015 CONSULTATION DATE: 07/29/15  REFERRING MD :  Burnett Corrente  CHIEF COMPLAINT:  hemoptysis  BRIEF PATIENT DESCRIPTION: 65 yo male with Hx of HTN, Type 2 DM, OSA,tobacco abuse, prior basal cell carcinoma(2010) was admitted to [facility] from April 5 -8 for CAP and left upper lung mass. Was treated with Levaquin and flagyl and was transitioned to oral Augmentin. Patient now presents with increased cough production and  Worsening Hemoptysis.  He was supposed to undergo fiberoptic bronchoscopy with Dr. Lamonte Sakai  But not yet had a biopsy of the lung mass.  SIGNIFICANT EVENTS  4/10> Readmitted  With HCAP and Hemoptysis   4/11 Developed Afib with RVR  STUDIES:   4/5 >>CT chest was concerning for Extensive masslike nodular opacities in the left lower lobe, with a confluent infrahilar mass measuring 3.2 x 3.2 cm, many of whichare contiguous with left hilar soft tissue density. There is extensive multifocal mediastinal adenopathy. Findings consistentwith malignancy, with primary likely bronchogenic in the left lowerlobe. These masslike opacities encase the left lower lobe pulmonary artery leads luminal narrowing but no discrete invasion or frankpulmonary embolus. Multiple ground-glass opacities in the left lowerlobe, may be pneumonitis, postobstructive atelectasis or spread of malignancy. Additional lingular nodule measures 1.5 cm.3. Bubbly density in the left mainstem bronchus and left lower lobe,with left lung bronchial thickening. This is likely secretions or aspiration.4. Moderate emphysema.   SUBJECTIVE:  Breathing much better and more comfortably today. Still with bloody mucous production  VITAL SIGNS: Temp:  [97.8 F (36.6 C)-98.6 F (37 C)] 97.8 F (36.6 C) (04/18 1133) Pulse Rate:  [88-96] 95 (04/18 1133) Resp:  [11-20] 13 (04/18 1133) BP: (93-146)/(59-85) 123/81 mmHg (04/18 1133) SpO2:  [85 %-98 %] 90 % (04/18  1133) Weight:  [90.946 kg (200 lb 8 oz)] 90.946 kg (200 lb 8 oz) (04/18 0500)  PHYSICAL EXAMINATION: General:  Acutely ill appearing overweight male Neuro:  Awake, alert, oriented,  HEENT:  atraumatic, normocephalic Cardiovascular:  NSR now, no MRG Lungs: Diminished bases, less WOB than yesterday, expiratory wheeze Abdomen: soft, nontender, BS+ Musculoskeletal:  No deformity or inflamation noted Skin:  Grossly intact   Recent Labs Lab 08/03/15 0600 08/04/15 0957 08/04/15 2338 08/05/15 1758  NA 140  --  135 136  K 3.3* 3.1* 3.5 3.2*  CL 98*  --  93* 93*  CO2 26  --  28 28  BUN 7  --  15 14  CREATININE 0.82  --  1.19 1.22  GLUCOSE 113*  --  136* 148*    Recent Labs Lab 07/31/15 0242 07/31/15 2355 08/03/15 0600  HGB 9.8* 9.4* 10.2*  HCT 30.6* 28.2* 31.5*  WBC 14.4* 13.3* 10.7*  PLT 154 155 206   No results found.  ASSESSMENT / PLAN:  65 year old male with tobacco abuse history presenting with hemoptysis and overnight developed a-fib with RVR that is paroxysmal.  Hemoptysis persists so no anticoagulation.  On exam, hemoptysis persists with coarse wheeze diffusely. AF chemically converted to NSR now with amiodarone.  Several long discussions about bronchoscopy. He is adamant that he would not want chemotherapy, and therefore, is reluctant to undergo procedure. It has been explained that even if this is determined to be cancer, chemotherapy may not be necessary, which remains unknown until some tissue diagnosis is obtained. He does not want to have bronch at this time.   Acute hypoxemic respiratory failure - now requiring 4L  O2, 3 weeks ago needed none Hemoptysis - improving, but not resolved - Holding anticoagulation - Recommend continuing diuresis as renal function will tolerate   A-fib, paroxysmal: - No anticoagulation in setting hemoptysis  - Amiodarone drip to prevent paroxysm since we can not anti-coag and high risk of CVA. - Cardiology following  Lungs  mass: - Does not wish to undergo bronchoscopy at this time.  - Oncology to see and discuss potential options - Oncology recommends Rad Onc consultation as well to possibly help with hemoptysis. Unable to get in touch with them this AM, will defer consultation to primary team.   Tobacco abuse: - Smoking cessation  Georgann Housekeeper, AGACNP-BC [facility] Pulmonology/Critical Care Pager [phone] or [phone]  08/06/2015 11:50 AM

## 2015-08-06 NOTE — Progress Notes (Signed)
Occupational Therapy Treatment Patient Details Name: [name] MRN: [id number] DOB: 1950/11/17 Today's Date: 08/06/2015    History of present illness 65 y.o. male admitted to [facility] on 07/28/15 with chest pain and hemoptysis.  Dx with acute kidney injury, hemoptysis (will undergo bronchoscopy with possible biopsy 07/31/15), HCAP, and pleuritis chest pain.  Pt with significant PMhx of collar bone fx ~6 weeks prior to admission, essential HTN, DM2, Stevens-Johnson syndrome, PAD, neuropathy, neck surgery, back surgery, and basal cell carcinoma (2010).     OT comments  Pt demonstrates ability to complete chair transfer with oxygen levels dropping into the 80s with incr time to rebound to 90s. Pt motivated by home made muffin provided by wife this morning to sit up in chair.     Follow Up Recommendations  Home health OT;Supervision/Assistance - 24 hour    Equipment Recommendations  Tub/shower seat    Recommendations for Other Services      Precautions / Restrictions Precautions Precautions: Other (comment);Fall Precaution Comments: monitor O2 sats       Mobility Bed Mobility               General bed mobility comments: eob sitting on arrival without alarm  Transfers Overall transfer level: Needs assistance   Transfers: Sit to/from Stand Sit to Stand: Min guard         General transfer comment: reaching for chair arm rest to squat pivot to chair. pt reports incr comfort in chair at this time. alarm placed to decr fall risk    Balance Overall balance assessment: Needs assistance Sitting-balance support: No upper extremity supported;Feet supported Sitting balance-Leahy Scale: Fair                             ADL Overall ADL's : Needs assistance/impaired Eating/Feeding: Modified independent;Sitting Eating/Feeding Details (indicate cue type and reason): opening muffin from home that wife delivered Grooming: Wash/dry hands;Set up;Sitting                   Toilet Transfer: Min guard;Stand-pivot             General ADL Comments: Pt on eob on arrival. pt able to sit EOB for 10 minutes to complete grooming and take medication. pt noted to be more ashen in skin tone compared to previous session. Pt needed cues to don glasses and in previous session wearing glasses on arrival throghout session. pt needed cues to attention to task at times during session. Wife present and pt asking questions that pt had discussed with wife previously. wife states "remember yesterday "        Vision                     Perception     Praxis      Cognition   Behavior During Therapy: WFL for tasks assessed/performed Overall Cognitive Status: Impaired/Different from baseline Area of Impairment: Attention;Safety/judgement;Awareness;Problem solving   Current Attention Level: Sustained      Safety/Judgement: Decreased awareness of safety;Decreased awareness of deficits Awareness: Intellectual Problem Solving: Slow processing;Difficulty sequencing;Requires verbal cues;Requires tactile cues General Comments: Pt closing eyes several times during session and required name call to return to task . pt very fixated on wife arrival with banana nut muffin. Pt unaware recliner is present in room and demonstrates this after OT asking if pt like to transfer for breakfast.     Extremity/Trunk Assessment  Exercises     Shoulder Instructions       General Comments      Pertinent Vitals/ Pain       Pain Assessment: No/denies pain  Home Living                                          Prior Functioning/Environment              Frequency Min 2X/week     Progress Toward Goals  OT Goals(current goals can now be found in the care plan section)  Progress towards OT goals: Progressing toward goals  Acute Rehab OT Goals Patient Stated Goal: to feel better OT Goal Formulation: With patient/family Time For  Goal Achievement: 09-09-15 Potential to Achieve Goals: Good ADL Goals Pt Will Perform Grooming: with modified independence;sitting Pt Will Perform Upper Body Bathing: with modified independence;sitting Pt Will Perform Lower Body Bathing: with modified independence;sit to/from stand Pt Will Transfer to Toilet: with modified independence;ambulating;regular height toilet  Plan Discharge plan remains appropriate    Co-evaluation                 End of Session Equipment Utilized During Treatment: Oxygen   Activity Tolerance Patient tolerated treatment well   Patient Left in chair;with call bell/phone within reach;with chair alarm set;with family/visitor present   Nurse Communication Mobility status;Precautions        Time: 0811-0828 OT Time Calculation (min): 17 min  Charges: OT General Charges $OT Visit: 1 Procedure OT Treatments $Self Care/Home Management : 8-22 mins  Parke Poisson B 08/06/2015, 12:07 PM  Jeri Modena   OTR/L Pager: [phone] Office: [phone] .

## 2015-08-06 NOTE — Progress Notes (Signed)
OT NOTE  Recommend Palliative consult to help with long term course of treatment for patient. Pt demonstrates generalized weakness and cognitive deficits.    Jeri Modena   OTR/L Pager: [phone] Office: [phone] .

## 2015-08-06 NOTE — Progress Notes (Signed)
       Patient Name: [name] Date of Encounter: 08/06/2015    SUBJECTIVE:Chest tightness improved.  TELEMETRY:  NSR: Filed Vitals:   08/06/15 0500 08/06/15 0600 08/06/15 0700 08/06/15 0810  BP:    144/77  Pulse: 92 89 88   Temp:    98 F (36.7 C)  TempSrc:    Oral  Resp: '17 13 16   '$ Height:      Weight: 200 lb 8 oz (90.946 kg)     SpO2: 90% 95% 93%     Intake/Output Summary (Last 24 hours) at 08/06/15 0956 Last data filed at 08/05/15 2300  Gross per 24 hour  Intake    840 ml  Output   1500 ml  Net   -660 ml   LABS: Basic Metabolic Panel:  Recent Labs  08/04/15 2338 08/05/15 1758  NA 135 136  K 3.5 3.2*  CL 93* 93*  CO2 28 28  GLUCOSE 136* 148*  BUN 15 14  CREATININE 1.19 1.22  CALCIUM 8.0* 8.0*   CBC: No results for input(s): WBC, NEUTROABS, HGB, HCT, MCV, PLT in the last 72 hours. Cardiac Enzymes:  Recent Labs  08/05/15 1040 08/05/15 1758 08/05/15 2159  TROPONINI <0.03 <0.03 <0.03     Radiology/Studies:  No new data  Physical Exam: Blood pressure 144/77, pulse 88, temperature 98 F (36.7 C), temperature source Oral, resp. rate 16, height '5\' 11"'$  (1.803 m), weight 200 lb 8 oz (90.946 kg), SpO2 93 %. Weight change: -2 lb 9.6 oz (-1.179 kg)  Wt Readings from Last 3 Encounters:  08/06/15 200 lb 8 oz (90.946 kg)  07/24/15 210 lb 12.8 oz (95.618 kg)  07/18/15 205 lb 6.4 oz (93.169 kg)   Chest with rhonchi Cardiac with regular rhythm  ASSESSMENT:  1. Atrial fib being suppressed with amiodarone 2. Acute diastolic HF improved with IV lasix yesterday.  Plan:  1. Check BNP 2. Diuretic therapy as needed.  Demetrios Isaacs 08/06/2015, 9:56 AM

## 2015-08-06 NOTE — Consult Note (Signed)
.    HEMATOLOGY/ONCOLOGY CONSULTATION NOTE  Date of Service: 08/06/2015  Patient Care Team: Joshua A Dettinger, MD as PCP - General (Family Medicine)  CHIEF COMPLAINTS/PURPOSE OF CONSULTATION:  Likely lung cancer  HISTORY OF PRESENTING ILLNESS:  [name] is a  64 y.o. male who has been referred to us by Dr .Nishant Dhungel, MD for evaluation and management of likely primary lung cancer.  Patient has a history of hypertension, diabetes, dyslipidemia, COPD, obstructive sleep apnea on CPAP, nonobstructive coronary artery disease, peripheral arterial disease, esophageal strictures with dilatation, chronic pain related to his back issues (being managed by [facility]) and neuropathy who presented to the hospital initially on 07/24/2015 with chest pain shortness of breath and hemoptysis. He had a chest x-ray that showed a left sided lung mass. He was seen by pulmonology and set up for outpatient follow-up pursue a bronchoscopy evaluation and biopsy. He was treated with Levaquin and Flagyl for possible postobstructive pneumonia and then with Augmentin. Noted to have dark stools with a history of NSAID use. GI was consulted and recommended outpatient follow-up. Patient was discharged but unfortunately got readmitted on 07/29/2015 with continued chest pain and increasing hemoptysis. In the emergency room his blood pressure was noted to be low at 80/50 and he was saturating in the low 80% range. He is not baseline oxygen prior to this as per his report. He was also noted to have acute kidney injury with an elevation in his creatinine level. Pulmonology was reconsulted. He was placed on IV Zosyn for healthcare acquired pneumonia. He has also had issues with fluid overload in the setting of possible diastolic CHF exacerbated by paroxysmal atrial fibrillation.  CT scan of the chest on 4/5 showed Extensive masslike nodular opacities in the left lower lobe, with a confluent infrahilar mass measuring 3.2 x 3.2 cm,  many of which are contiguous with left hilar soft tissue density. There is extensive multifocal mediastinal adenopathy. 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 These masslike opacities encase the left lower lobe pulmonary artery leads luminal narrowing but no discrete invasion or frank pulmonary embolus. Multiple ground-glass opacities in the left lower lobe, may be pneumonitis, postobstructive atelectasis or spread of malignancy. Additional lingular nodule measures 1.5 cm.  Patient has not had a biopsy of his lung lesion yet.  He notes that his fiance's sister was treated   for lung cancer and had a very rapid course. As a result he is uncertain how much of her workup he would like be open to any particular treatment options. Palliative care to see the patient to define goals of care. Radiation oncology has been consulted to consider RT for hemoptysis. Patient during my discussion does not completely rule out the idea of getting staging workup to completely characterize the disease but wants to think about it and discuss this with his fiance before deciding on biopsy.     MEDICAL HISTORY:  Past Medical History  Diagnosis Date  . Essential hypertension, benign   . Type 2 diabetes mellitus (HCC)   . Mixed hyperlipidemia   . OSA (obstructive sleep apnea)   . Partial small bowel obstruction (HCC)   . 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 Coronary atherosclerosis of native coronary artery     Nonobstructive  . Erythrocytosis   . Peripheral arterial disease (HCC)     70% right iliac  . History of MRSA infection   . Neuropathy (HCC) 2013  .   Cancer (HCC) 2015    skin  . Cancer of lung (HCC) 07-24-15    SURGICAL HISTORY: Past Surgical History  Procedure Laterality Date  . Appendectomy    . Neck surgery    . Back surgery    . Hemorrhoid surgery    . Vasectomy      SOCIAL HISTORY: Social History   Social History  .  Marital Status: Divorced    Spouse Name: N/A  . Number of Children: 7  . Years of Education: N/A   Occupational History  . UNEMPLOYED    Social History Main Topics  . Smoking status: Current Every Day Smoker -- 1.00 packs/day for 40 years    Types: Cigarettes  . Smokeless tobacco: Never Used  . Alcohol Use: No  . Drug Use: No  . Sexual Activity: Not on file   Other Topics Concern  . Not on file   Social History Narrative    FAMILY HISTORY: Family History  Problem Relation Age of Onset  . Coronary artery disease    . Hypertension    . Cancer Mother   . Depression Mother   . Diabetes Mother   . Heart disease Maternal Grandfather   . Hyperlipidemia Maternal Grandfather   . Hypertension Maternal Grandfather     ALLERGIES:  is allergic to nsaids; prednisone; and vancomycin.  MEDICATIONS:  Current Facility-Administered Medications  Medication Dose Route Frequency Provider Last Rate Last Dose  . amiodarone (PACERONE) tablet 400 mg  400 mg Oral BID James Hochrein, MD   400 mg at 08/06/15 0900  . antiseptic oral rinse (CPC / CETYLPYRIDINIUM CHLORIDE 0.05%) solution 7 mL  7 mL Mouth Rinse BID Orlando Vega, MD   7 mL at 08/06/15 1000  . atorvastatin (LIPITOR) tablet 20 mg  20 mg Oral Daily James Sullivan, MD   20 mg at 08/05/15 1711  . buPROPion (WELLBUTRIN XL) 24 hr tablet 150 mg  150 mg Oral Daily James Sullivan, MD   150 mg at 08/06/15 0900  . fentaNYL (SUBLIMAZE) injection 50 mcg  50 mcg Intravenous Q3H PRN Orlando Vega, MD   50 mcg at 08/04/15 0805  . fluticasone (FLONASE) 50 MCG/ACT nasal spray 1 spray  1 spray Each Nare BID PRN James Sullivan, MD      . fluticasone furoate-vilanterol (BREO ELLIPTA) 100-25 MCG/INH 1 puff  1 puff Inhalation Daily James Sullivan, MD   1 puff at 08/05/15 0740  . gabapentin (NEURONTIN) tablet 600 mg  600 mg Oral BID James Sullivan, MD   600 mg at 08/06/15 0900  . guaiFENesin-dextromethorphan (ROBITUSSIN DM) 100-10 MG/5ML syrup 5 mL  5 mL Oral  Q4H PRN Bincy S Varughese, NP   5 mL at 07/31/15 1717  . hydrOXYzine (ATARAX/VISTARIL) tablet 25 mg  25 mg Oral TID PRN Orlando Vega, MD   25 mg at 08/02/15 1848  . insulin aspart (novoLOG) injection 0-20 Units  0-20 Units Subcutaneous TID WC James Sullivan, MD   7 Units at 08/06/15 1143  . insulin aspart (novoLOG) injection 0-5 Units  0-5 Units Subcutaneous QHS James Sullivan, MD   0 Units at 07/29/15 0115  . ipratropium (ATROVENT) nebulizer solution 0.5 mg  0.5 mg Nebulization TID Orlando Vega, MD   0.5 mg at 08/05/15 1912  . isosorbide mononitrate (IMDUR) 24 hr tablet 60 mg  60 mg Oral Daily Henry W Smith, MD   60 mg at 08/06/15 0900  . levalbuterol (XOPENEX) nebulizer solution 0.63 mg  0.63 mg Nebulization TID Orlando   Vega, MD   0.63 mg at 08/05/15 1912  . levalbuterol (XOPENEX) nebulizer solution 1.25 mg  1.25 mg Nebulization Q6H PRN James Sullivan, MD   1.25 mg at 08/05/15 2247  . levofloxacin (LEVAQUIN) tablet 750 mg  750 mg Oral Q24H Crystal S Robertson, RPH   750 mg at 08/05/15 1711  . LORazepam (ATIVAN) tablet 1 mg  1 mg Oral Q6H PRN Orlando Vega, MD   1 mg at 08/06/15 1240  . metoprolol (LOPRESSOR) injection 2.5 mg  2.5 mg Intravenous Q6H PRN Orlando Vega, MD   2.5 mg at 08/02/15 0406  . morphine 2 MG/ML injection 2 mg  2 mg Intravenous Q3H PRN Douglas B McQuaid, MD   2 mg at 08/06/15 1240  . ondansetron (ZOFRAN) tablet 4 mg  4 mg Oral Q6H PRN James Sullivan, MD       Or  . ondansetron (ZOFRAN) injection 4 mg  4 mg Intravenous Q6H PRN James Sullivan, MD   4 mg at 08/03/15 0540  . oxyCODONE (OXYCONTIN) 12 hr tablet 30 mg  30 mg Oral TID Belkys A Regalado, MD   30 mg at 08/06/15 1357  . oxyCODONE-acetaminophen (PERCOCET) 7.5-325 MG per tablet 1-2 tablet  1-2 tablet Oral Q6H PRN Orlando Vega, MD   2 tablet at 08/06/15 1143  . pantoprazole (PROTONIX) EC tablet 40 mg  40 mg Oral Daily James Sullivan, MD   40 mg at 08/06/15 0900  . technetium TC 99M diethylenetriame-pentaacetic acid (DTPA)  injection 31.1 milli Curie  31.1 milli Curie Intravenous Once PRN Belkys A Regalado, MD        REVIEW OF SYSTEMS:    10 Point review of Systems was done is negative except as noted above.  PHYSICAL EXAMINATION: ECOG PERFORMANCE STATUS: 2-3  . Filed Vitals:   08/06/15 1400 08/06/15 1500  BP:    Pulse: 98 92  Temp:    Resp: 15 13   Filed Weights   08/04/15 0318 08/05/15 0300 08/06/15 0500  Weight: 199 lb 6.4 oz (90.447 kg) 203 lb 1.6 oz (92.126 kg) 200 lb 8 oz (90.946 kg)   .Body mass index is 27.98 kg/(m^2).  GENERAL:alert, Pale-appearing , mild respiratory distress anxious.  SKIN: skin color, texture, turgor are normal, no rashes or significant lesions EYES: normal, conjunctiva are pink and non-injected, sclera clear OROPHARYNX:no exudate, no erythema and lips, buccal mucosa, and tongue normal  NECK: supple, no JVD, thyroid normal size, non-tender, without nodularity LYMPH:  no palpable lymphadenopathy in the cervical, axillary or inguinal LUNGS: Bilateral decreased breath sounds . HEART Irregular ABDOMEN: Obese, soft, no tenderness to palpation. Normoactive bowel sounds  PSYCH: alert & oriented x 3 with fluent speech NEURO: no focal motor/sensory deficits  LABORATORY DATA:  I have reviewed the data as listed  . CBC Latest Ref Rng 08/07/2015 08/03/2015 07/31/2015  WBC 4.0 - 10.5 K/uL 13.4(H) 10.7(H) 13.3(H)  Hemoglobin 13.0 - 17.0 g/dL 9.4(L) 10.2(L) 9.4(L)  Hematocrit 39.0 - 52.0 % 28.9(L) 31.5(L) 28.2(L)  Platelets 150 - 400 K/uL 171 206 155    CMP Latest Ref Rng 08/07/2015 08/05/2015 08/04/2015  Glucose 65 - 99 mg/dL 143(H) 148(H) 136(H)  BUN 6 - 20 mg/dL 13 14 15  Creatinine 0.61 - 1.24 mg/dL 1.26(H) 1.22 1.19  Sodium 135 - 145 mmol/L 135 136 135  Potassium 3.5 - 5.1 mmol/L 4.4 3.2(L) 3.5  Chloride 101 - 111 mmol/L 96(L) 93(L) 93(L)  CO2 22 - 32 mmol/L 27 28 28  Calcium 8.9 - 10.3   mg/dL 8.4(L) 8.0(L) 8.0(L)     RADIOGRAPHIC STUDIES: I have personally reviewed  the radiological images as listed and agreed with the findings in the report. Dg Chest 2 View  08/06/2015  CLINICAL DATA:  Shortness of breath today. Cough and hemoptysis. Lung mass. Initial encounter. EXAM: CHEST  2 VIEW COMPARISON:  CT chest 07/24/2015. Single view of the chest 08/03/2015 and 07/28/2015. FINDINGS: The lungs are emphysematous. Interstitial and airspace opacities which are worse on the left persists but aeration is improved since the most recent examination. Mediastinal and hilar lymphadenopathy is unchanged in appearance. There are small bilateral pleural effusions. The patient's lung mass is not well scratch the patient's lung mass is better seen on the prior CT. IMPRESSION: Improved aeration bilaterally.  No new abnormality. Mediastinal and hilar lymphadenopathy in patient with known left lower lobe pulmonary nodules. Small bilateral pleural effusions. Electronically Signed   By: Inge Rise M.D.   On: 08/06/2015 13:19   Dg Chest 2 View  07/23/2015  CLINICAL DATA:  Cough and dyspnea for several weeks. Hemoptysis tonight. EXAM: CHEST  2 VIEW COMPARISON:  06/16/2015 FINDINGS: There is mild hyperinflation. There is moderate vascular and interstitial prominence which has worsened. There is increased interstitial fluid or thickening in the basilar periphery. There is worsened alveolar opacity in the lingula. These findings may represent congestive heart failure with asymmetric alveolar edema. There is no pleural effusion. IMPRESSION: Worsened vascular and interstitial changes. Worsened lingular alveolar opacity. This may represent congestive failure but an infectious infiltrate is not entirely excluded. Electronically Signed   By: Andreas Newport M.D.   On: 07/23/2015 21:21   Dg Clavicle Right  07/09/2015  Clinical:  History of clavicle fracture several weeks old. X-rays were taken of the right clavicle, two views: There is a comminuted midshaft clavicle fracture on the right with two  small butterfly fragments present.  There is bayonet positioning with the proximal portion more superior.  Little call us is seen.   07/09/2015   Comminuted midshaft fracture of the right clavicle.  Ct Angio Chest Pe W/cm &/or Wo Cm  07/24/2015  CLINICAL DATA:  Hemoptysis. Sudden onset of chest pain yesterday afternoon. Pain radiates to the back. EXAM: CT ANGIOGRAPHY CHEST WITH CONTRAST TECHNIQUE: Multidetector CT imaging of the chest was performed using the standard protocol during bolus administration of intravenous contrast. Multiplanar CT image reconstructions and MIPs were obtained to evaluate the vascular anatomy. CONTRAST:  100 mL Isovue 370 IV COMPARISON:  Chest radiographs 5 hours prior.  Chest CT 12/20/2010 FINDINGS: Mediastinum/Lymph Nodes: Extensive multifocal mediastinal adenopathy. Enlarged lymph nodes in the right upper paratracheal, highest mediastinum, right lower paratracheal, AP window, and subcarinal stations. Subcarinal soft tissue density is unable to be separated from the esophagus, however measures at least 5.6 cm transverse dimension. Extensive left hilar soft tissue density, contiguous with multi focal nodular soft tissue density in the left lower lobe. This soft tissue density leads to narrowing of the left lower lobe pulmonary arteries without definite invasion. No discrete filling defects within the pulmonary arteries. Some degree of occlusion of the left lower lobe pulmonary vein. Thoracic aorta is normal in caliber. No pericardial effusion. There is a prominent right hilar lymph node. Heart is upper limits of normal in size, there are coronary artery calcifications. Lungs/Pleura: Multifocal innumerable left lower lobe nodules, confluent in the infrahilar region, with a masslike confluence measuring 3.2 x 3.2 cm. More superiorly this confluence measures 4.1 cm 4.1 x 3.5 cm and includes a  segmental branch of the left lower lobe pulmonary artery. Associated diffuse ground-glass and  ill-defined opacity in the left lower lobe. Paramediastinal nodule in the lingula measures 1.5 cm. There is fissural thickening of the interlobar fissure. Diffuse septal thickening in the left lower lobe. Bubbly dependent debris in the left mainstem bronchus extending into the lower lobe with bronchial thickening and luminal narrowing. No definite nodule in the right lung. Moderate emphysema. Trace left pleural effusion. No right pleural effusion. Upper abdomen: Portions of both adrenal glands are normal, however not entirely excluded. No evidence of focal lesion in the liver allowing for phase of contrast. No acute abnormality in the included upper abdomen. Musculoskeletal: Right mid clavicle fracture with minimal surrounding callus formation. No blastic or destructive lytic lesions. Review of the MIP images confirms the above findings. IMPRESSION: 1. Extensive masslike nodular opacities in the left lower lobe, with a confluent infrahilar mass measuring 3.2 x 3.2 cm, many of which are contiguous with left hilar soft tissue density. There is extensive multifocal mediastinal adenopathy. 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 These masslike opacities encase the left lower lobe pulmonary artery leads luminal narrowing but no discrete invasion or frank pulmonary embolus. Multiple ground-glass opacities in the left lower lobe, may be pneumonitis, postobstructive atelectasis or spread of malignancy. 2. Additional lingular nodule measures 1.5 cm. 3. Bubbly density in the left mainstem bronchus and left lower lobe, with left lung bronchial thickening. This is likely secretions or aspiration. 4. Moderate emphysema. Electronically Signed   By: Melanie  Ehinger M.D.   On: 07/24/2015 02:17   Nm Pulmonary Perf And Vent  07/29/2015  CLINICAL DATA:  Patient with postsurgical chest pain and hypoxia. Patient could not elevate her arms. EXAM: NUCLEAR MEDICINE VENTILATION -  PERFUSION LUNG SCAN TECHNIQUE: Ventilation images were obtained in multiple projections using inhaled aerosol Tc-99m DTPA. Perfusion images were obtained in multiple projections after intravenous injection of Tc-99m MAA. RADIOPHARMACEUTICALS:  31.1 mCi Technetium-99m DTPA aerosol inhalation and 4.2 mCi Technetium-99m MAA IV COMPARISON:  Chest radiograph, 07/28/2015 FINDINGS: Ventilation: Decreased ventilation to left lung base corresponding to the area of airspace consolidation as well as overall decreased activity in the left lung when compared to the right. Perfusion: Normal profusion to the right lung. Decreased profusion seen throughout the left lung with no discrete segmental defect. IMPRESSION: 1. Decreased profusion to the entire left lung. There is also decreased ventilation to the left lung and more discrete ventilatory defect the left lung base where there is consolidation on the current chest radiograph. This is an intermediate probability study for pulmonary thromboembolism based on modified prior PET criteria. Consider followup CTA of the chest if this patient can tolerate that procedure. Electronically Signed   By: David  Ormond M.D.   On: 07/29/2015 12:08   Dg Chest Port 1 View  08/03/2015  CLINICAL DATA:  64-year-old male with shortness of breath and wheezing EXAM: PORTABLE CHEST 1 VIEW COMPARISON:  Prior chest x-ray 07/28/2015; prior chest CT 07/24/2015 FINDINGS: Interval development of diffuse bilateral predominantly interstitial opacities. There is more dense opacifications in the left base obscuring the heart margin and diaphragm. Mediastinal fullness with bulky nodular opacities in the right paratracheal stripe are similar compared to prior. Left lower lobe pulmonary nodules or now obscured. No acute osseous abnormality. IMPRESSION: 1. Interval development of mild pulmonary edema concerning for volume overload versus CHF. 2. Possible left pleural effusion. 3. Similar appearance of mediastinal  and left hilar adenopathy.   The known left lower lobe pulmonary nodules are now obscured from view by the edema and possible left-sided pleural effusion. Electronically Signed   By: Heath  McCullough M.D.   On: 08/03/2015 11:35   Dg Chest Port 1 View  07/28/2015  CLINICAL DATA:  Chest pain and dyspnea, onset today.  Hypotension. EXAM: PORTABLE CHEST 1 VIEW COMPARISON:  07/23/2015 FINDINGS: There is worsened left lower lobe consolidation in this may represent postobstructive pneumonia. Hemorrhage, aspiration, neoplasm could also produce this appearance. There is hilar and mediastinal adenopathy. Right lung is clear. No large effusions. Normal pulmonary vasculature. Chronic right clavicle midshaft fracture deformity. IMPRESSION: Worsened left lower lobe consolidation. Electronically Signed   By: Daniel R Mitchell M.D.   On: 07/28/2015 22:51    ASSESSMENT & PLAN:   64-year-old gentleman with multiple medical comorbidities, history of smoking now with  #1 left lower lobe multiple lung nodules with infrahilar mass and mediastinal adenopathy. Additional nodule in the left lingula. He appears at least M1a. Complete staging with imaging has not been done at this time. Presentation is consistent with likely non-small cell lung cancer. This is being complicated by a postobstructive pneumonia and significant hemoptysis. Currently the patient has a poor performance status ECOG PS of 3. #2 postobstructive pneumonia versus healthcare quite pneumonia versus lymphangitic spread of carcinoma. #3 diastolic CHF with paroxysmal atrial fibrillation. #4 hemoptysis due to lung mass and pneumonia. Plan -If agreeable he would need  Staging with CT abdomen/pelvis imaging as well as an MRI of the brain w/wo contrast. -We'll need issue diagnosis with the biopsy to make histologic diagnosis and adequate tissue for additional molecular studies. -We would be able to weigh in on additional treatment options with this data. -Would  recommend radiation oncology consultation to consider palliative RT to the lung/mediastinal mass from the perspective of controlling hemoptysis and bronchial obstruction. Would likely not be a good candidate for concurrent chemoradiation given likely limited lung reserve and his performance status. -Would probably offer him chemotherapy if this were small cell lung cancer since symptoms might improve with chemotherapy despite poor functional status currently. -If this is non-small cell lung cancer and the patient had one of the molecular mutations such as EGFR mutation, ALK gene rearrangement, Ros 1 gene rearrangement -would consider targeted therapies despite his performance status given lower treatment burden and high likelihood of response. -If his tumor is significantly PDL 1 positive might be able to get away with immunotherapies for systemic treatment. -The MRI shows brain lesions - that would need a specific treatment strategy.  We'll continue to follow as needed. I discussed with the patient that he will need to continue to crystallize his goals of care since that would determine further management. Patient is likely to have a significant burden of symptoms which will benefit from ongoing palliative care consultation as an inpatient and home palliative or hospice on discharge depending on further treatment plans.  All of the patients questions were answered with apparent satisfaction. The patient knows to call the clinic with any problems, questions or concerns.  I spent 80 minutes counseling the patient face to face. The total time spent in the appointment was 80 minutes and more than 50% was on counseling and direct patient cares.    Gautam Kale MD MS AAHIVMS [facility] Hematology/Oncology Physician [facility]  (Office):       [phone] (Work cell):  [phone] (Fax):           [phone]  08/06/2015 3:38 PM      

## 2015-08-06 NOTE — Progress Notes (Signed)
[facility] PROGRESS NOTE  [name] JYN:[id number] DOB: Feb 14, 1951 DOA: 07/28/2015 PCP: Fransisca Kaufmann Dettinger, MD  Brief Narrative: Pt is a 65 y/o who presented with Acute respiratory failure. Was found to have a large subcarinal mass as well as a left lung mass with left lower lobe infiltrate. Patient presented with hemoptysis and pulmonology consulted. Pt placed on antibiotics and hospital stay complicated by new onset afib of which cardiology was consulted. afib better controlled over the weekend but patient developed some fluid overload which improved with lasix. On reevaluation by pulmonologist patient indicates that he does not want to have chemotherapy as such no plans for biopsy at this juncture. Will consult palliative for GOC  Assessment/Plan:  Atrial fibrillation - Cardiology consulted and currently managing. Pt on amiodarone and atenolol  Acute kidney injury.  - resolved with IVF rehydration. Most likely prerenal cause  Acute respiratory failure - Multifactorial. Suspect patient is fluid overloaded. Agree with continuing lasix. Chest x ray reviewed. - will reassess BMP and monitor K levels.   Hemoptysis. Chest pain  Resolving per my discussion with patient. No plans for bronchoscopy currently. Pulmonology on board. Last cbc stable. Will reassess next am.  Hypokalemia - replace orally and reassess   HCAP  - currently on oral antibiotic.   Pleuritic pain. Suspect secondary to the left upper lobe lung mass and left lower lobe pneumonia. Continue supportive therapy.   Iron deficiency anemia. Hemoglobin appears to be relatively stable.    Recent cervical collar fracture occurred about 6 weeks ago. Continues on oxycodone 30 mg twice a day and will changed to Percocet to 7-325 every 6 hours when necessary as patient complaining of discomfort   Obstructive sleep apnea. CPAP daily at bedtime   Hyperlipidemia.  Continue atorvastatin 20 mg daily   Depression/anxiety.  continue Wellbutrin 150 mg daily, Will add Ativan  Tobacco abuse. Topical NicoDerm 21 mg, smoking cessation advised  11. GERD.  Protonix 40 mg daily  12. DVT prophylaxis. SCDs given the hemoptysis  Code Status: DNR Family Communication: Wife at bedside.  Disposition Plan: Remain inpatient.    Consultants:  Pulmonary   Palliative  Procedures:  none  Antibiotics:  Levaquin  HPI/Subjective: Having pain at chest on and off, Pt does not want chemotherapy. Interested in discussion of goals of care with palliative per our discussion.  Objective: Filed Vitals:   08/06/15 0700 08/06/15 0810  BP:  144/77  Pulse: 88   Temp:  98 F (36.7 C)  Resp: 16     Intake/Output Summary (Last 24 hours) at 08/06/15 0949 Last data filed at 08/05/15 2300  Gross per 24 hour  Intake    840 ml  Output   1500 ml  Net   -660 ml   Filed Weights   08/04/15 0318 08/05/15 0300 08/06/15 0500  Weight: 90.447 kg (199 lb 6.4 oz) 92.126 kg (203 lb 1.6 oz) 90.946 kg (200 lb 8 oz)    Exam:   General:  NAD, alert and awake  Cardiovascular: S1, S 2 RRR  Respiratory: rhales at left lung field, no wheezes, equal chest rise.  Abdomen: BS present, soft, nt  Musculoskeletal: no edema.   Data Reviewed: Basic Metabolic Panel:  Recent Labs Lab 07/31/15 0242 08/03/15 0600 08/04/15 0957 08/04/15 2338 08/05/15 1758  NA 134* 140  --  135 136  K 3.7 3.3* 3.1* 3.5 3.2*  CL 99* 98*  --  93* 93*  CO2 24 26  --  28 28  GLUCOSE 158* 113*  --  136* 148*  BUN 10 7  --  15 14  CREATININE 0.90 0.82  --  1.19 1.22  CALCIUM 9.0 8.7*  --  8.0* 8.0*   Liver Function Tests: No results for input(s): AST, ALT, ALKPHOS, BILITOT, PROT, ALBUMIN in the last 168 hours. No results for input(s): LIPASE, AMYLASE in the last 168 hours. No results for input(s): AMMONIA in the last 168 hours. CBC:  Recent Labs Lab 07/31/15 0242 07/31/15 2355 08/03/15 0600  WBC 14.4* 13.3* 10.7*  NEUTROABS 12.4*  --   --    HGB 9.8* 9.4* 10.2*  HCT 30.6* 28.2* 31.5*  MCV 84.5 85.5 84.7  PLT 154 155 206   Cardiac Enzymes:  Recent Labs Lab 07/31/15 0242 07/31/15 0935 08/05/15 1040 08/05/15 1758 08/05/15 2159  TROPONINI 0.03 0.04* <0.03 <0.03 <0.03   BNP (last 3 results)  Recent Labs  07/23/15 2241  BNP 12.0    ProBNP (last 3 results) No results for input(s): PROBNP in the last 8760 hours.  CBG:  Recent Labs Lab 08/04/15 2123 08/05/15 0833 08/05/15 1214 08/05/15 1634 08/06/15 0809  GLUCAP 178* 189* 239* 158* 142*    Recent Results (from the past 240 hour(s))  Culture, blood (routine x 2) Call MD if unable to obtain prior to antibiotics being given     Status: None   Collection Time: 07/29/15  1:37 AM  Result Value Ref Range Status   Specimen Description BLOOD RIGHT ARM  Final   Special Requests BOTTLES DRAWN AEROBIC ONLY 5CC  Final   Culture NO GROWTH 5 DAYS  Final   Report Status 08/03/2015 FINAL  Final  Culture, blood (routine x 2) Call MD if unable to obtain prior to antibiotics being given     Status: None   Collection Time: 07/29/15  1:42 AM  Result Value Ref Range Status   Specimen Description BLOOD RIGHT ARM  Final   Special Requests BOTTLES DRAWN AEROBIC ONLY 5CC  Final   Culture NO GROWTH 5 DAYS  Final   Report Status 08/03/2015 FINAL  Final  Culture, sputum-assessment     Status: None   Collection Time: 07/29/15  7:34 AM  Result Value Ref Range Status   Specimen Description SPUTUM  Final   Special Requests NONE  Final   Sputum evaluation   Final    THIS SPECIMEN IS ACCEPTABLE. RESPIRATORY CULTURE REPORT TO FOLLOW.   Report Status 07/29/2015 FINAL  Final  Culture, respiratory (NON-Expectorated)     Status: None   Collection Time: 07/29/15  8:20 AM  Result Value Ref Range Status   Specimen Description SPUTUM  Final   Special Requests NONE  Final   Gram Stain   Final    ABUNDANT WBC PRESENT,BOTH PMN AND MONONUCLEAR FEW SQUAMOUS EPITHELIAL CELLS PRESENT NO  ORGANISMS SEEN Performed at [facility]    Culture   Final    FEW CANDIDA ALBICANS Performed at [facility]    Report Status 07/31/2015 FINAL  Final     Studies: No results found.  Scheduled Meds: . amiodarone  400 mg Oral BID  . antiseptic oral rinse  7 mL Mouth Rinse BID  . atorvastatin  20 mg Oral Daily  . buPROPion  150 mg Oral Daily  . fluticasone furoate-vilanterol  1 puff Inhalation Daily  . gabapentin  600 mg Oral BID  . insulin aspart  0-20 Units Subcutaneous TID WC  . insulin aspart  0-5 Units Subcutaneous QHS  .  ipratropium  0.5 mg Nebulization TID  . isosorbide mononitrate  60 mg Oral Daily  . levalbuterol  0.63 mg Nebulization TID  . levofloxacin  750 mg Oral Q24H  . morphine      . oxyCODONE  30 mg Oral TID  . pantoprazole  40 mg Oral Daily   Continuous Infusions:    Active Problems:   Hemoptysis   AKI (acute kidney injury) ([place])   Atrial fibrillation with RVR ([facility])   Tobacco use disorder   Shortness of breath   Dyspnea  Time spent: 25 minutes.    Velvet Bathe  [facility] Pager [phone]. If 7PM-7AM, please contact night-coverage at [email or web address], password [facility] 08/06/2015, 9:49 AM  LOS: 8 days

## 2015-08-06 NOTE — Progress Notes (Signed)
  Echocardiogram 2D Echocardiogram has been performed.  Johny Chess 08/06/2015, 4:44 PM

## 2015-08-07 ENCOUNTER — Encounter (HOSPITAL_COMMUNITY): Payer: Medicare Other

## 2015-08-07 ENCOUNTER — Inpatient Hospital Stay (HOSPITAL_COMMUNITY): Payer: Medicare Other

## 2015-08-07 DIAGNOSIS — J9601 Acute respiratory failure with hypoxia: Secondary | ICD-10-CM | POA: Diagnosis present

## 2015-08-07 DIAGNOSIS — R29898 Other symptoms and signs involving the musculoskeletal system: Secondary | ICD-10-CM

## 2015-08-07 DIAGNOSIS — R2990 Unspecified symptoms and signs involving the nervous system: Secondary | ICD-10-CM | POA: Diagnosis not present

## 2015-08-07 DIAGNOSIS — I639 Cerebral infarction, unspecified: Secondary | ICD-10-CM

## 2015-08-07 LAB — GLUCOSE, CAPILLARY
GLUCOSE-CAPILLARY: 183 mg/dL — AB (ref 65–99)
GLUCOSE-CAPILLARY: 195 mg/dL — AB (ref 65–99)
Glucose-Capillary: 158 mg/dL — ABNORMAL HIGH (ref 65–99)
Glucose-Capillary: 133 mg/dL — ABNORMAL HIGH (ref 65–99)

## 2015-08-07 LAB — BASIC METABOLIC PANEL
ANION GAP: 12 (ref 5–15)
BUN: 13 mg/dL (ref 6–20)
CALCIUM: 8.4 mg/dL — AB (ref 8.9–10.3)
CO2: 27 mmol/L (ref 22–32)
Chloride: 96 mmol/L — ABNORMAL LOW (ref 101–111)
Creatinine, Ser: 1.26 mg/dL — ABNORMAL HIGH (ref 0.61–1.24)
GFR calc Af Amer: >60 mL/min (ref >60)
GFR, EST NON AFRICAN AMERICAN: 59 mL/min — AB (ref >60)
GLUCOSE: 143 mg/dL — AB (ref 65–99)
Potassium: 4.4 mmol/L (ref 3.5–5.1)
Sodium: 135 mmol/L (ref 135–145)

## 2015-08-07 LAB — CBC
HCT: 28.9 % — ABNORMAL LOW (ref 39.0–52.0)
Hemoglobin: 9.4 g/dL — ABNORMAL LOW (ref 13.0–17.0)
MCH: 27.9 pg (ref 26.0–34.0)
MCHC: 32.5 g/dL (ref 30.0–36.0)
MCV: 85.8 fL (ref 78.0–100.0)
Platelets: 171 K/uL (ref 150–400)
RBC: 3.37 MIL/uL — ABNORMAL LOW (ref 4.22–5.81)
RDW: 14.5 % (ref 11.5–15.5)
WBC: 13.4 K/uL — ABNORMAL HIGH (ref 4.0–10.5)

## 2015-08-07 MED ORDER — FUROSEMIDE 10 MG/ML IJ SOLN
40.0000 mg | Freq: Every day | INTRAMUSCULAR | Status: DC
  Administered 2015-08-07: 40 mg via INTRAVENOUS
  Filled 2015-08-07: qty 4

## 2015-08-07 MED ORDER — FUROSEMIDE 40 MG PO TABS: 40.0000 mg | ORAL_TABLET | Freq: Once | ORAL | Status: DC

## 2015-08-07 NOTE — Progress Notes (Signed)
Medical Oncology Short Note  Chart reviewed. Patient had strokelike symptoms with left-sided weakness overnight. CT head was negative for overt stroke. TPA was not given due to his hemoptysis. As per patient's request I called his fiance Despina Hick and [phone] to reiterate the discussion I had with the patient yesterday evening. We discussed that it would be important for the patient to define his goals of care.  Given his overall performance status he would be a poor candidate for chemotherapy currently. He might be a candidate for radiation therapy to his lung mass to control his hemoptysis if no other issues become more concerning including his neurological deficits. We discussed that treatment plan going ahead depend on the patient's goals of care. - One option would be to avoid burden of further diagnostic workup/biopsies and pursue a purely comfort care approach. - Another approach would be to try to determine the reason for his neurological deficits with an MRI of the brain to determine if this was a CVA related to his atrial fibrillation or a brain lesion related to his lung cancer and consider imaging of his abdomen/pelvis and brain to determine if there is other easily biopsy able lesions and to complete his staging. -Biopsy results including histology and molecular studies would determine other possible treatment options including targeted therapies or immunotherapies.  I did reiterated that a comfort care option would be quite reasonable but that it is a very personal decision and he has to decide between these 2 approaches. We shall be available to answer any additional questions as they might arise.  Sullivan Lone MD [place] AAHIVMS [facility] Hematology/Oncology Physician [place]  (Office):       [phone] (Work cell):  [phone] (Fax):           [phone]

## 2015-08-07 NOTE — Progress Notes (Signed)
Nutrition Follow-up  DOCUMENTATION CODES:   Not applicable  INTERVENTION:   -Continue with Carb Modified diet -Encourage PO intake  NUTRITION DIAGNOSIS:   Inadequate oral intake related to altered GI function as evidenced by per patient/family report.  Progressing  GOAL:   Patient will meet greater than or equal to 90% of their needs  Progressing  MONITOR:   PO intake, Labs, Weight trends, Skin, I & O's  REASON FOR ASSESSMENT:   Consult Assessment of nutrition requirement/status  ASSESSMENT:   [name] is a 65 y.o. male past medical history significant for chronic tobacco abuse by 1-2 cigarettes daily, prior basal cell carcinoma diagnosed in 2010, obstructive sleep apnea on CPAP daily at bedtime, esophageal strictures status post dilation, hypertension and hyperlipidemia was recently admitted to our hospital [facility] April 5 through the 8th because patient was thought to have a community-acquired pneumonia as well as a left upper lobe lung mass.  Pt receiving nursing care at time of visit.   Rapid response called on 08/06/17; neurology evaluation suggested acute lacunar stroke affecting rt BG. Pt refusing further work-up at this time.   Per MD notes, CT on 08/05/15 revealed large subcarinal mass and left lung mass with left lobe infiltrate. Pt refusing bronchoscopy and potential chemotherapy. Pt and family have been having ongoing discussions with palliative care regarding goals of care.   Pt remains on carb modified diet. Per meal completion records, PO: 90-100%. Pt refusing nutritional supplements.   Labs reviewed: CBGS: 150-183.  Diet Order:  Diet Carb Modified Fluid consistency:: Thin; Room service appropriate?: Yes  Skin:  Reviewed, no issues  Last BM:  08/05/15  Height:   Ht Readings from Last 1 Encounters:  07/29/15 '5\' 11"'$  (1.803 m)    Weight:   Wt Readings from Last 1 Encounters:  08/07/15 202 lb 9.6 oz (91.899 kg)    Ideal Body Weight:   78.2 kg  BMI:  Body mass index is 28.27 kg/(m^2).  Estimated Nutritional Needs:   Kcal:  1950-2150  Protein:  90-100 grams  Fluid:  1.9-2.1 L  EDUCATION NEEDS:   No education needs identified at this time  Monika Chestang A. Jimmye Norman, RD, LDN, CDE Pager: [phone] After hours Pager: [phone]

## 2015-08-07 NOTE — Progress Notes (Addendum)
PROGRESS NOTE                                                                                                                                                                                                             Patient Demographics:    [name], is a 65 y.o. male, DOB - 09-29-1950, VFI:[id number]  Admit date - 07/28/2015   Admitting Physician Annita Brod, MD  Outpatient Primary MD for the patient is Worthy Rancher, MD  LOS - 9  Outpatient Specialists: none  Chief Complaint  Patient presents with  . Shortness of Breath  . Chest Pain       Brief Narrative   65 year old male with history of anxiety, depression, tobacco abuse, OSA, hyperlipidemia, history of basal cell carcinoma, esophageal stricture status post dilatation, recent hospitalization for community-acquired pneumonia and found to have left upper lobe lung mass who was supposed to get phenobarbital bronchoscopy as outpatient presented with hemoptysis and chest pain. In the ED he was afebrile and hypotensive with systolic blood pressure of 80, hypoxic on room air in the 80s. He had leukocytosis of 18 K (was on steroid recently) with acute kidney injury (BUN of 40 and creatinine 2.48). Chest x-ray showed worsening left lower lobe consolidation and admitted to stepdown unit for HCAP and acute kidney injury. Hospital course complicated due to the element of rapid A. fib, persistent respiratory failure, ongoing hemoptysis and possible stroke.    Subjective:   Overnight patient had acute onset of left-sided weakness. Code stroke was called. Neurology was consulted. Patient had desired no further workup so we'll stroke was canceled, no TPA given since patient complaining of hemoptysis. Head CT done was unremarkable. Patient complains of having ongoing left-sided chest pain and being congested, shortness of breath.   Assessment  & Plan :    Active  Problems: Acute hypoxic respiratory failure Likely due to pneumonia and acute diastolic CHF. Continue O2 via nasal cannula. Had received when necessary Lasix earlier. We'll add scheduled IV Lasix 40 mg daily. Monitor I/O. Continue nebs. Continue antibiotics (has completed course)    Hemoptysis with left-sided chest pain Improving but not resolved. Left-sided pleurisy persist. Continue physical OxyContin (4 chronic back pain), when necessary Percocet, morphine and fentanyl. Continue O2 via nasal cannula.  Large subcarinal lung mass Patient still not interested in bronchoscopy  and does not want chemotherapy at all. Was seen by oncology. Recommendations pending.? May benefit from radiation for his symptoms. (Chest pain and hemoptysis) Palliative care discussing goals of care.  Stroke-like symptoms Acute symptoms with left-sided weakness on 4/18. Has residual weakness. Cannot rule out stroke versus brain metastases. Patient agrees to have CVA workup. Ordered MRI brain/MRA head and carotid Doppler. Consult neurology following results. Cannot use aspirin or anticoagulation due to hemoptysis.Marland Kitchen Physical therapy.  A. fib rate control with amiodarone. Echo shows diastolic dysfunction. Cardiology following.  Healthcare associated pneumonia Completed antibiotics.  Recent cervical fracture On scheduled OxyContin.  Iron deficiency anemia Stable.  Hyperlipidemia Continue Lipitor  Anxiety and depression Continue Wellbutrin and when necessary Ativan.  Tobacco abuse Continue nicotine patch.    Diet: Heart healthy      Code Status : DO NOT RESUSCITATE  Family Communication  : Fianc at bedside  Disposition Plan  : Home with outpatient palliative care versus home hospice  Barriers For Discharge :  Getting CVA workup, needs clinical improvement of his dyspnea and final decision on goals of care with palliative care  Consults  :   Cardiology Urology Oncology [facility] CM Palliative  care  Procedures  :  CT head 2-D echo MRI brain/MRA head Carotid Doppler  DVT Prophylaxis  :  SCDs  Lab Results  Component Value Date   PLT 171 08/07/2015    Antibiotics  :   Zosyn 4/10-4/14 Levaquin 4/14-4/90  Anti-infectives    Start     Dose/Rate Route Frequency Ordered Stop   08/02/15 1800  levofloxacin (LEVAQUIN) tablet 750 mg     750 mg Oral Every 24 hours 08/02/15 1702     07/29/15 2200  linezolid (ZYVOX) IVPB 600 mg  Status:  Discontinued     600 mg 300 mL/hr over 60 Minutes Intravenous Every 12 hours 07/29/15 2123 08/01/15 1037   07/29/15 0115  piperacillin-tazobactam (ZOSYN) IVPB 3.375 g  Status:  Discontinued     3.375 g 12.5 mL/hr over 240 Minutes Intravenous 3 times per day 07/29/15 0109 08/02/15 1700        Objective:   Filed Vitals:   08/07/15 0857 08/07/15 0900 08/07/15 0919 08/07/15 1100  BP: 155/91 155/91  152/79  Pulse:  99  100  Temp: 98.9 F (37.2 C)   97.4 F (36.3 C)  TempSrc: Axillary   Oral  Resp:  17  13  Height:      Weight:      SpO2:  90% 92% 95%    Wt Readings from Last 3 Encounters:  08/07/15 91.899 kg (202 lb 9.6 oz)  07/24/15 95.618 kg (210 lb 12.8 oz)  07/18/15 93.169 kg (205 lb 6.4 oz)     Intake/Output Summary (Last 24 hours) at 08/07/15 1250 Last data filed at 08/07/15 0929  Gross per 24 hour  Intake    840 ml  Output    650 ml  Net    190 ml     Physical Exam  Gen: Appears fatigued HEENT: no pallor, moist mucosa, supple neck Chest: Fine bibasilar crackles, no rhonchi or wheeze CVS:  S1&S2 irregular, no murmurs, rubs or gallop GI: soft, NT, ND, BS+ Musculoskeletal: warm, no edema CNS: Alert and oriented, bilateral left extremity weakness 3+/5 power    Data Review:    CBC  Recent Labs Lab 07/31/15 2355 08/03/15 0600 08/07/15 0025  WBC 13.3* 10.7* 13.4*  HGB 9.4* 10.2* 9.4*  HCT 28.2* 31.5* 28.9*  PLT 155 206  171  MCV 85.5 84.7 85.8  MCH 28.5 27.4 27.9  MCHC 33.3 32.4 32.5  RDW 14.4 14.4  14.5    Chemistries   Recent Labs Lab 08/03/15 0600 08/04/15 0957 08/04/15 2338 08/05/15 1758 08/07/15 0025  NA 140  --  135 136 135  K 3.3* 3.1* 3.5 3.2* 4.4  CL 98*  --  93* 93* 96*  CO2 26  --  '28 28 27  '$ GLUCOSE 113*  --  136* 148* 143*  BUN 7  --  '15 14 13  '$ CREATININE 0.82  --  1.19 1.22 1.26*  CALCIUM 8.7*  --  8.0* 8.0* 8.4*   ------------------------------------------------------------------------------------------------------------------ No results for input(s): CHOL, HDL, LDLCALC, TRIG, CHOLHDL, LDLDIRECT in the last 72 hours.  Lab Results  Component Value Date   HGBA1C 7.4 02/27/2015   ------------------------------------------------------------------------------------------------------------------ No results for input(s): TSH, T4TOTAL, T3FREE, THYROIDAB in the last 72 hours.  Invalid input(s): FREET3 ------------------------------------------------------------------------------------------------------------------ No results for input(s): VITAMINB12, FOLATE, FERRITIN, TIBC, IRON, RETICCTPCT in the last 72 hours.  Coagulation profile No results for input(s): INR, PROTIME in the last 168 hours.  No results for input(s): DDIMER in the last 72 hours.  Cardiac Enzymes  Recent Labs Lab 08/05/15 1040 08/05/15 1758 08/05/15 2159  TROPONINI <0.03 <0.03 <0.03   ------------------------------------------------------------------------------------------------------------------    Component Value Date/Time   BNP 11.6 08/06/2015 1158    Inpatient Medications  Scheduled Meds: . amiodarone  400 mg Oral BID  . antiseptic oral rinse  7 mL Mouth Rinse BID  . atorvastatin  20 mg Oral Daily  . buPROPion  150 mg Oral Daily  . fluticasone furoate-vilanterol  1 puff Inhalation Daily  . furosemide  40 mg Intravenous Daily  . gabapentin  600 mg Oral BID  . insulin aspart  0-20 Units Subcutaneous TID WC  . insulin aspart  0-5 Units Subcutaneous QHS  . ipratropium  0.5  mg Nebulization TID  . isosorbide mononitrate  60 mg Oral Daily  . levalbuterol  0.63 mg Nebulization TID  . levofloxacin  750 mg Oral Q24H  . oxyCODONE  30 mg Oral TID  . pantoprazole  40 mg Oral Daily   Continuous Infusions:  PRN Meds:.fentaNYL (SUBLIMAZE) injection, fluticasone, guaiFENesin-dextromethorphan, hydrOXYzine, levalbuterol, LORazepam, metoprolol, morphine injection, ondansetron **OR** ondansetron (ZOFRAN) IV, oxyCODONE-acetaminophen, technetium TC 65M diethylenetriame-pentaacetic acid  Micro Results Recent Results (from the past 240 hour(s))  Culture, blood (routine x 2) Call MD if unable to obtain prior to antibiotics being given     Status: None   Collection Time: 07/29/15  1:37 AM  Result Value Ref Range Status   Specimen Description BLOOD RIGHT ARM  Final   Special Requests BOTTLES DRAWN AEROBIC ONLY 5CC  Final   Culture NO GROWTH 5 DAYS  Final   Report Status 08/03/2015 FINAL  Final  Culture, blood (routine x 2) Call MD if unable to obtain prior to antibiotics being given     Status: None   Collection Time: 07/29/15  1:42 AM  Result Value Ref Range Status   Specimen Description BLOOD RIGHT ARM  Final   Special Requests BOTTLES DRAWN AEROBIC ONLY 5CC  Final   Culture NO GROWTH 5 DAYS  Final   Report Status 08/03/2015 FINAL  Final  Culture, sputum-assessment     Status: None   Collection Time: 07/29/15  7:34 AM  Result Value Ref Range Status   Specimen Description SPUTUM  Final   Special Requests NONE  Final   Sputum evaluation  Final    THIS SPECIMEN IS ACCEPTABLE. RESPIRATORY CULTURE REPORT TO FOLLOW.   Report Status 07/29/2015 FINAL  Final  Culture, respiratory (NON-Expectorated)     Status: None   Collection Time: 07/29/15  8:20 AM  Result Value Ref Range Status   Specimen Description SPUTUM  Final   Special Requests NONE  Final   Gram Stain   Final    ABUNDANT WBC PRESENT,BOTH PMN AND MONONUCLEAR FEW SQUAMOUS EPITHELIAL CELLS PRESENT NO ORGANISMS  SEEN Performed at [facility]    Culture   Final    FEW CANDIDA ALBICANS Performed at [facility]    Report Status 07/31/2015 FINAL  Final    Radiology Reports Dg Chest 2 View  08/06/2015  CLINICAL DATA:  Shortness of breath today. Cough and hemoptysis. Lung mass. Initial encounter. EXAM: CHEST  2 VIEW COMPARISON:  CT chest 07/24/2015. Single view of the chest 08/03/2015 and 07/28/2015. FINDINGS: The lungs are emphysematous. Interstitial and airspace opacities which are worse on the left persists but aeration is improved since the most recent examination. Mediastinal and hilar lymphadenopathy is unchanged in appearance. There are small bilateral pleural effusions. The patient's lung mass is not well scratch the patient's lung mass is better seen on the prior CT. IMPRESSION: Improved aeration bilaterally.  No new abnormality. Mediastinal and hilar lymphadenopathy in patient with known left lower lobe pulmonary nodules. Small bilateral pleural effusions. Electronically Signed   By: Inge Rise M.D.   On: 08/06/2015 13:19   Dg Chest 2 View  07/23/2015  CLINICAL DATA:  Cough and dyspnea for several weeks. Hemoptysis tonight. EXAM: CHEST  2 VIEW COMPARISON:  06/16/2015 FINDINGS: There is mild hyperinflation. There is moderate vascular and interstitial prominence which has worsened. There is increased interstitial fluid or thickening in the basilar periphery. There is worsened alveolar opacity in the lingula. These findings may represent congestive heart failure with asymmetric alveolar edema. There is no pleural effusion. IMPRESSION: Worsened vascular and interstitial changes. Worsened lingular alveolar opacity. This may represent congestive failure but an infectious infiltrate is not entirely excluded. Electronically Signed   By: Andreas Newport M.D.   On: 07/23/2015 21:21   Dg Clavicle Right  07/09/2015  Clinical:  History of clavicle fracture several weeks old. X-rays were  taken of the right clavicle, two views: There is a comminuted midshaft clavicle fracture on the right with two small butterfly fragments present.  There is bayonet positioning with the proximal portion more superior.  Little call us is seen.   07/09/2015   Comminuted midshaft fracture of the right clavicle.  Ct Head Wo Contrast  08/07/2015  CLINICAL DATA:  Code stroke.  Left-sided weakness. EXAM: CT HEAD WITHOUT CONTRAST TECHNIQUE: Contiguous axial images were obtained from the base of the skull through the vertex without intravenous contrast. COMPARISON:  06/16/2015 FINDINGS: Mild cerebral atrophy. No ventricular dilatation. No mass effect or midline shift. No abnormal extra-axial fluid collections. Gray-white matter junctions are distinct. Basal cisterns are not effaced. No evidence of acute intracranial hemorrhage. No depressed skull fractures. Visualized paranasal sinuses and mastoid air cells are not opacified. IMPRESSION: No acute intracranial abnormalities. These results were called by telephone at the time of interpretation on 08/07/2015 at 1:20 am to Dr. Sallyanne Havers , who verbally acknowledged these results. Electronically Signed   By: Lucienne Capers M.D.   On: 08/07/2015 01:21   Ct Angio Chest Pe W/cm &/or Wo Cm  07/24/2015  CLINICAL DATA:  Hemoptysis. Sudden onset of chest  pain yesterday afternoon. Pain radiates to the back. EXAM: CT ANGIOGRAPHY CHEST WITH CONTRAST TECHNIQUE: Multidetector CT imaging of the chest was performed using the standard protocol during bolus administration of intravenous contrast. Multiplanar CT image reconstructions and MIPs were obtained to evaluate the vascular anatomy. CONTRAST:  100 mL Isovue 370 IV COMPARISON:  Chest radiographs 5 hours prior.  Chest CT 12/20/2010 FINDINGS: Mediastinum/Lymph Nodes: Extensive multifocal mediastinal adenopathy. Enlarged lymph nodes in the right upper paratracheal, highest mediastinum, right lower paratracheal, AP window, and subcarinal  stations. Subcarinal soft tissue density is unable to be separated from the esophagus, however measures at least 5.6 cm transverse dimension. Extensive left hilar soft tissue density, contiguous with multi focal nodular soft tissue density in the left lower lobe. This soft tissue density leads to narrowing of the left lower lobe pulmonary arteries without definite invasion. No discrete filling defects within the pulmonary arteries. Some degree of occlusion of the left lower lobe pulmonary vein. Thoracic aorta is normal in caliber. No pericardial effusion. There is a prominent right hilar lymph node. Heart is upper limits of normal in size, there are coronary artery calcifications. Lungs/Pleura: Multifocal innumerable left lower lobe nodules, confluent in the infrahilar region, with a masslike confluence measuring 3.2 x 3.2 cm. More superiorly this confluence measures 4.1 cm 4.1 x 3.5 cm and includes a segmental branch of the left lower lobe pulmonary artery. Associated diffuse ground-glass and ill-defined opacity in the left lower lobe. Paramediastinal nodule in the lingula measures 1.5 cm. There is fissural thickening of the interlobar fissure. Diffuse septal thickening in the left lower lobe. Bubbly dependent debris in the left mainstem bronchus extending into the lower lobe with bronchial thickening and luminal narrowing. No definite nodule in the right lung. Moderate emphysema. Trace left pleural effusion. No right pleural effusion. Upper abdomen: Portions of both adrenal glands are normal, however not entirely excluded. No evidence of focal lesion in the liver allowing for phase of contrast. No acute abnormality in the included upper abdomen. Musculoskeletal: Right mid clavicle fracture with minimal surrounding callus formation. No blastic or destructive lytic lesions. Review of the MIP images confirms the above findings. IMPRESSION: 1. Extensive masslike nodular opacities in the left lower lobe, with a  confluent infrahilar mass measuring 3.2 x 3.2 cm, many of which are contiguous with left hilar soft tissue density. There is extensive multifocal mediastinal adenopathy. 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 These masslike opacities encase the left lower lobe pulmonary artery leads luminal narrowing but no discrete invasion or frank pulmonary embolus. Multiple ground-glass opacities in the left lower lobe, may be pneumonitis, postobstructive atelectasis or spread of malignancy. 2. Additional lingular nodule measures 1.5 cm. 3. Bubbly density in the left mainstem bronchus and left lower lobe, with left lung bronchial thickening. This is likely secretions or aspiration. 4. Moderate emphysema. Electronically Signed   By: Jeb Levering M.D.   On: 07/24/2015 02:17   Nm Pulmonary Perf And Vent  07/29/2015  CLINICAL DATA:  Patient with postsurgical chest pain and hypoxia. Patient could not elevate her arms. EXAM: NUCLEAR MEDICINE VENTILATION - PERFUSION LUNG SCAN TECHNIQUE: Ventilation images were obtained in multiple projections using inhaled aerosol Tc-75mDTPA. Perfusion images were obtained in multiple projections after intravenous injection of Tc-947mAA. RADIOPHARMACEUTICALS:  31.1 mCi Technetium-9973mPA aerosol inhalation and 4.2 mCi Technetium-4m24m IV COMPARISON:  Chest radiograph, 07/28/2015 FINDINGS: Ventilation: Decreased ventilation to left lung base corresponding to the area of airspace consolidation  as well as overall decreased activity in the left lung when compared to the right. Perfusion: Normal profusion to the right lung. Decreased profusion seen throughout the left lung with no discrete segmental defect. IMPRESSION: 1. Decreased profusion to the entire left lung. There is also decreased ventilation to the left lung and more discrete ventilatory defect the left lung base where there is consolidation on the current chest radiograph. This is  an intermediate probability study for pulmonary thromboembolism based on modified prior PET criteria. Consider followup CTA of the chest if this patient can tolerate that procedure. Electronically Signed   By: Lajean Manes M.D.   On: 07/29/2015 12:08   Dg Chest Port 1 View  08/03/2015  CLINICAL DATA:  65 year old male with shortness of breath and wheezing EXAM: PORTABLE CHEST 1 VIEW COMPARISON:  Prior chest x-ray 07/28/2015; prior chest CT 07/24/2015 FINDINGS: Interval development of diffuse bilateral predominantly interstitial opacities. There is more dense opacifications in the left base obscuring the heart margin and diaphragm. Mediastinal fullness with bulky nodular opacities in the right paratracheal stripe are similar compared to prior. Left lower lobe pulmonary nodules or now obscured. No acute osseous abnormality. IMPRESSION: 1. Interval development of mild pulmonary edema concerning for volume overload versus CHF. 2. Possible left pleural effusion. 3. Similar appearance of mediastinal and left hilar adenopathy. The known left lower lobe pulmonary nodules are now obscured from view by the edema and possible left-sided pleural effusion. Electronically Signed   By: Jacqulynn Cadet M.D.   On: 08/03/2015 11:35   Dg Chest Port 1 View  07/28/2015  CLINICAL DATA:  Chest pain and dyspnea, onset today.  Hypotension. EXAM: PORTABLE CHEST 1 VIEW COMPARISON:  07/23/2015 FINDINGS: There is worsened left lower lobe consolidation in this may represent postobstructive pneumonia. Hemorrhage, aspiration, neoplasm could also produce this appearance. There is hilar and mediastinal adenopathy. Right lung is clear. No large effusions. Normal pulmonary vasculature. Chronic right clavicle midshaft fracture deformity. IMPRESSION: Worsened left lower lobe consolidation. Electronically Signed   By: Andreas Newport M.D.   On: 07/28/2015 22:51    Time Spent in minutes  35   Louellen Molder M.D on 08/07/2015 at 12:50  PM  Between 7am to 7pm - Pager - [phone]  After 7pm go to [email or web address] - password [email or web address]  [facility] -  Office  [phone]

## 2015-08-07 NOTE — Plan of Care (Signed)
Problem: Acute Rehab PT Goals(only PT should resolve) Goal: Pt Will Go Up/Down Stairs Outcome: Not Progressing Will continue to assess appropriateness of stair goal. Currently would need ambulance transport to achieve goal of discharge home.

## 2015-08-07 NOTE — Progress Notes (Signed)
Patient placed himself on home CPAP unit set at 14cm with oxygen set at 6lpm

## 2015-08-07 NOTE — Progress Notes (Signed)
   Decrease amiodarone to 200 mg twice a day.After 2 weeks, can decrease to 200 mg daily.May be a good long-term therapy to prevent recurrent AF given disease process and overall poor prognosis.  Not a candidate for anticoagulation therapy given hemoptysis.  Please call if we may be of further assistance.

## 2015-08-07 NOTE — Evaluation (Signed)
Physical Therapy Re-evaluation (due to new neuro changes) Patient Details Name: [name] MRN: [id number] DOB: Nov 16, 1950 Today's Date: 08/07/2015   History of Present Illness  65 y.o. male admitted to [facility] on 07/28/15 with chest pain and hemoptysis.  Dx with acute kidney injury, hemoptysis, multiple lung nodules/mass (refusing biopsy), HCAP, and pleuritis chest pain. 4/19 pt developed numbness and weak left arm and leg and code stroke called. Unfortunately he has current hemoptisis and could not offer tPA.Per neurology likely acute lacunar infarct Rt basal ganglia. Pt with significant PMhx of collar bone fx ~6 weeks prior to admission, essential HTN, DM2, Stevens-Johnson syndrome, PAD, neuropathy, neck surgery, back surgery, and basal cell carcinoma (2010).      Clinical Impression  Patient now with significant Lt sided weakness. Medically he continues to decline. Noted palliative care involved and to continue to discuss his goals. Will continue to work with patient to incr his function for his goal thus far is to return home. See updated goals.    Follow Up Recommendations Supervision/Assistance - 24 hour (TBA--recent decline in function)    Equipment Recommendations   (TBA)    Recommendations for Other Services OT consult     Precautions / Restrictions Precautions Precautions: Fall Precaution Comments: monitor O2 sats      Mobility  Bed Mobility               General bed mobility comments: pt up in chair  Transfers                 General transfer comment: pt refused  Ambulation/Gait                Stairs            Wheelchair Mobility    Modified Rankin (Stroke Patients Only) Modified Rankin (Stroke Patients Only) Pre-Morbid Rankin Score: Moderately severe disability Modified Rankin: Severe disability     Balance   Sitting-balance support: Single extremity supported;Feet supported Sitting balance-Leahy Scale: Poor Sitting balance  - Comments: sitting edge of chair   Standing balance support:  (pt refused to attempt)                                 Pertinent Vitals/Pain Pain Assessment: Faces Faces Pain Scale: Hurts even more Pain Location: "tailbone" Pain Descriptors / Indicators: Discomfort;Grimacing;Pressure Pain Intervention(s): Limited activity within patient's tolerance;Monitored during session;Repositioned (encouraged lateral weight shifts; pt refused to stand)    Home Living Family/patient expects to be discharged to:: Private residence Living Arrangements: Spouse/significant other Available Help at Discharge: Family;Available 24 hours/day Type of Home: House Home Access: Stairs to enter Entrance Stairs-Rails: None Entrance Stairs-Number of Steps: 2 Home Layout: One level Home Equipment: None      Prior Function Level of Independence: Independent         Comments: Has never used home O2     Hand Dominance   Dominant Hand: Right    Extremity/Trunk Assessment   Upper Extremity Assessment: LUE deficits/detail       LUE Deficits / Details: grip 3+, wrist extension 2+, biceps 3+, shoulder flexion 3-   Lower Extremity Assessment: LLE deficits/detail   LLE Deficits / Details: knee extension 3-, knee flexion ~3, ankle DF 3      Communication   Communication: No difficulties  Cognition Arousal/Alertness: Awake/alert Behavior During Therapy: WFL for tasks assessed/performed Overall Cognitive Status: Impaired/Different from baseline Area of Impairment: Attention;Safety/judgement;Awareness  Current Attention Level: Sustained     Safety/Judgement: Decreased awareness of safety;Decreased awareness of deficits Awareness: Intellectual Problem Solving: Slow processing;Requires verbal cues;Requires tactile cues General Comments: Patient initially denying LLE weakness (it's just my arm), however once tested/demonstrated he recognized LE was weaker    General Comments  General comments (skin integrity, edema, etc.): Wife present and encouraging patient to participate. Patient appears very fatigued with incr O2 requirements and RN reports to receive incr lasix     Exercises General Exercises - Upper Extremity Shoulder Flexion: AROM;Right;Seated;Other reps (comment) Shoulder Extension: AROM;Strengthening;Both;Other reps (comment);Seated General Exercises - Lower Extremity Ankle Circles/Pumps: AROM;Both;5 reps Short Arc Quad: AROM;Right;AAROM;Left;5 reps      Assessment/Plan    PT Assessment Patient needs continued PT services  PT Diagnosis Hemiplegia non-dominant side;Generalized weakness   PT Problem List Decreased strength;Decreased activity tolerance;Decreased balance;Decreased mobility;Decreased knowledge of use of DME;Pain;Cardiopulmonary status limiting activity;Decreased safety awareness;Impaired sensation  PT Treatment Interventions DME instruction;Gait training;Stair training;Functional mobility training;Therapeutic activities;Therapeutic exercise;Balance training;Patient/family education;Neuromuscular re-education;Cognitive remediation   PT Goals (Current goals can be found in the Care Plan section) Acute Rehab PT Goals Patient Stated Goal: to feel better    Frequency Min 3X/week   Barriers to discharge        Co-evaluation               End of Session Equipment Utilized During Treatment: Oxygen Activity Tolerance: Patient limited by fatigue;Patient limited by lethargy Patient left: in chair;with call bell/phone within reach;with family/visitor present Nurse Communication: Mobility status (educated RN on anticipated safest transfer with LLE weakness)         Time: 0347-4259 PT Time Calculation (min) (ACUTE ONLY): 13 min   Charges:   PT Evaluation $PT Re-evaluation: 1 Procedure     PT G Codes:        Jezebelle Ledwell Sep 05, 2015, 11:07 AM Pager [phone]

## 2015-08-07 NOTE — Code Documentation (Signed)
Called to assess [name] for acute onset Lt side numbness and weakness.  Per primary RN LKW [id number].  Pt reports he noticed his Lt arm felt "different" when the lab tech started to draw labs.  The primary RN was contacted and discovered the pt to have LUE & LLE weakness in addition to his reported numbness. On exam he drifts LUE & LLE, Lt side sensory deficit & LUE ataxia, NIH 4.  Code stroke was called and the pt was transported to CT scan.  It wasn't until we were transporting to CT scan that the primary RN reported the pt had been coughing up blood regularly r/t his lung ca.  By the completion of the CT scan the pt's symptoms had begun to resolve with only sensory deficit as residual.  [name], neurology, updated and present during CT scan.  The pt is not a tPA candidate.  Updated the pt's wife upon return to the room.

## 2015-08-07 NOTE — Progress Notes (Signed)
Patient had episode of left arm numbness and new symptoms per patient. Alert and oriented x 4 move all extremeties but noticed some weakness on the left arm and drifting. Rapid response was called and assessed patient, CT scan of the head was done and Dr. Wendee Beavers on call neurologist saw patient and assessed. Patient symptoms slowly resolved and neuro checked every 2 hours done. Will continue to monitor patient.Marland KitchenMarland Kitchen

## 2015-08-07 NOTE — Progress Notes (Signed)
Name: [name] MRN: [id number] DOB: 1950/10/12    ADMISSION DATE:  07/28/2015 CONSULTATION DATE: 07/29/15  REFERRING MD :  Burnett Corrente  CHIEF COMPLAINT:  hemoptysis  BRIEF PATIENT DESCRIPTION: 65 yo male with Hx of HTN, Type 2 DM, OSA,tobacco abuse, prior basal cell carcinoma(2010) was admitted to [facility] from April 5 -8 for CAP and left upper lung mass. Was treated with Levaquin and flagyl and was transitioned to oral Augmentin. Patient now presents with increased cough production and  Worsening Hemoptysis.  He was supposed to undergo fiberoptic bronchoscopy with Dr. Lamonte Sakai  But not yet had a biopsy of the lung mass.   SIGNIFICANT EVENTS  4/10> Readmitted  With HCAP and Hemoptysis   4/11 Developed Afib with RVR  STUDIES:   4/5 >>CT chest was concerning for Extensive masslike nodular opacities in the left lower lobe, with a confluent infrahilar mass measuring 3.2 x 3.2 cm, many of whichare contiguous with left hilar soft tissue density. There is extensive multifocal mediastinal adenopathy. Findings consistentwith malignancy, with primary likely bronchogenic in the left lowerlobe. These masslike opacities encase the left lower lobe pulmonary artery leads luminal narrowing but no discrete invasion or frankpulmonary embolus. Multiple ground-glass opacities in the left lowerlobe, may be pneumonitis, postobstructive atelectasis or spread of malignancy. Additional lingular nodule measures 1.5 cm.3. Bubbly density in the left mainstem bronchus and left lower lobe,with left lung bronchial thickening. This is likely secretions or aspiration.4. Moderate emphysema.  SUBJECTIVE:  Significant other at bedside. Pt had weakness of left extremity, Code stroke called and cancelled. Neurology consulted. Ct Head negative for Acute abnormality. Pt upset that I mentioned going through more workup- with MRI, Carotid dopplers, considering possible etiologies- lung mets Vs embolic dx, if he does not want to do a  bronchoscopy to find out the type of cancer or does not want to do chemotherapy either, also we cannot give antiplatelet agents or anticoagulants or treat if emboli with new atria fib, with presence of significant hemoptysis. But patient says he would just like to know why he has the weakness. Apologized to patient, i did not mean to upset him.  Pt declined rest of exam. Pt is still having significant hemoptysis. He was also seen by palliative and oncology also.  Pt seen and was comfortable > did not have questions for me (Dr. Corrie Dandy). Wife was at bedside.   VITAL SIGNS: Temp:  [97.6 F (36.4 C)-98.9 F (37.2 C)] 98.9 F (37.2 C) (04/19 0857) Pulse Rate:  [92-104] 99 (04/19 0900) Resp:  [13-20] 17 (04/19 0900) BP: (123-155)/(74-91) 155/91 mmHg (04/19 0900) SpO2:  [84 %-99 %] 92 % (04/19 0919) Weight:  [202 lb 9.6 oz (91.899 kg)] 202 lb 9.6 oz (91.899 kg) (04/19 0501)  PHYSICAL EXAMINATION: General:  Acutely ill appearing overweight male, appears pale, not in any acute distress, O2 sats- 95%. Neuro:  Awake, alert, oriented,  HEENT:  atraumatic, normocephalic Cardiovascular: good s1/s2. (-) s3/m/r/g Lungs: fair ae. Bibasilar crackles Abdomen: (+) BS, soft, NT. (-) masses/tenderness.  Musculoskeletal:  Pt declined Skin:  Appears pale.  Recent Labs Lab 08/04/15 2338 08/05/15 1758 08/07/15 0025  NA 135 136 135  K 3.5 3.2* 4.4  CL 93* 93* 96*  CO2 '28 28 27  '$ BUN '15 14 13  '$ CREATININE 1.19 1.22 1.26*  GLUCOSE 136* 148* 143*    Recent Labs Lab 07/31/15 2355 08/03/15 0600 08/07/15 0025  HGB 9.4* 10.2* 9.4*  HCT 28.2* 31.5* 28.9*  WBC 13.3* 10.7* 13.4*  PLT 155 206 171   Dg Chest 2 View  08/06/2015  CLINICAL DATA:  Shortness of breath today. Cough and hemoptysis. Lung mass. Initial encounter. EXAM: CHEST  2 VIEW COMPARISON:  CT chest 07/24/2015. Single view of the chest 08/03/2015 and 07/28/2015. FINDINGS: The lungs are emphysematous. Interstitial and airspace opacities which  are worse on the left persists but aeration is improved since the most recent examination. Mediastinal and hilar lymphadenopathy is unchanged in appearance. There are small bilateral pleural effusions. The patient's lung mass is not well scratch the patient's lung mass is better seen on the prior CT. IMPRESSION: Improved aeration bilaterally.  No new abnormality. Mediastinal and hilar lymphadenopathy in patient with known left lower lobe pulmonary nodules. Small bilateral pleural effusions. Electronically Signed   By: Inge Rise M.D.   On: 08/06/2015 13:19   Ct Head Wo Contrast  08/07/2015  CLINICAL DATA:  Code stroke.  Left-sided weakness. EXAM: CT HEAD WITHOUT CONTRAST TECHNIQUE: Contiguous axial images were obtained from the base of the skull through the vertex without intravenous contrast. COMPARISON:  06/16/2015 FINDINGS: Mild cerebral atrophy. No ventricular dilatation. No mass effect or midline shift. No abnormal extra-axial fluid collections. Gray-white matter junctions are distinct. Basal cisterns are not effaced. No evidence of acute intracranial hemorrhage. No depressed skull fractures. Visualized paranasal sinuses and mastoid air cells are not opacified. IMPRESSION: No acute intracranial abnormalities. These results were called by telephone at the time of interpretation on 08/07/2015 at 1:20 am to Dr. Sallyanne Havers , who verbally acknowledged these results. Electronically Signed   By: Lucienne Capers M.D.   On: 08/07/2015 01:21    ASSESSMENT / PLAN:  65 year old male with tobacco abuse history presenting with hemoptysis and developed a-fib with RVR that is paroxysmal.  Hemoptysis persists so no anticoagulation.  AF chemically converted to NSR now with amiodarone. Less hemoptysis this am.   Several long discussions about bronchoscopy. He is adamant that he would not want chemotherapy, and therefore, is reluctant to undergo procedure. It has been explained that even if this is determined to be  cancer, chemotherapy may not be necessary, which remains unknown until some tissue diagnosis is obtained. He does not want to have bronch at this time. Pt does NOT want Rx if ever it is cancer.   Acute hypoxemic respiratory failure - now requiring 4L O2, 3 weeks ago needed none.Appears pale, today, on nasal canula, sats- 95%. - Still declining bronchoscopy.  Hemoptysis - Persistent, but not resolved - Holding anticoagulation  New CVA- ?lung mets, VS cardio-embolic with new Atria fib. Not a candidate for anticoag or even antiplatelets with significant ongoing hemoptysis. - Planned CVA workup.   A-fib, paroxysmal: - No anticoagulation in setting hemoptysis  - Amiodarone drip to prevent paroxysm since we can not anti-coag and high risk of CVA. - Cardiology following.  Lungs mass: - Does not wish to undergo bronchoscopy at this time.  - Oncology to see and discuss potential options - Oncology recommends Rad- Onc consultation as well to possibly help with hemoptysis.  - Palliative following.  Tobacco abuse: - Smoking cessation.  PCCM Will sign off for now. Call us back if with questions or concerns. Pt does not want to have bronchoscopy or treatment if ever it is cancer.   I discussed the plan with Dr. Denton Brick.  Agree with history, PE, assessment and plan.  Note above has been edited by me.   Bing Neighbors, MD. Graciella Freer. 08/07/2015 10:20 AM  J. Gerrit Friends  Radford Pax, MD 08/07/2015, 11:42 AM [facility] Pager [phone] After 3 pm or if no answer, call [phone]

## 2015-08-07 NOTE — Progress Notes (Signed)
Patient requesting to have MRI tomorrow (4/20) due to increased pain today and increased need for IV pain medication.  MRI notified.  [place], [name]

## 2015-08-07 NOTE — Consult Note (Signed)
Neurology Consultation Reason for Consult: Code stroke Referring Physician: Code stroke  CC: left sided numbness and weakness  History is obtained from patient and patient's nurse.  HPI: [name] is a 65 y.o. male with hx of lung CA.  He presented with acute respiratory failure. Was found to have a large subcarinal mass as well as a left lung mass with left lower lobe infiltrate. Unfortunately he presented with hemoptysis and pulmonology consulted adn patient has been consulted with pall care. Tonight he was normal neurologically at 11:15pm but before the code was called the phlebotomy punture did not seen as painful as normal to pt and then it became eveident that he was also weak on left arm and leg and code stroke called.  Unfortunately he has current hemoptisis and thus I could not offer tPA.  Code stroke cancelled.     ROS: A 14 point ROS was performed and is negative except as noted in the HPI.   Past Medical History  Diagnosis Date  . Essential hypertension, benign   . Type 2 diabetes mellitus ([place])   . Mixed hyperlipidemia   . OSA (obstructive sleep apnea)   . Partial small bowel obstruction ([place])   . 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 Coronary atherosclerosis of native coronary artery     Nonobstructive  . Erythrocytosis   . Peripheral arterial disease ([facility])     70% right iliac  . History of MRSA infection   . Neuropathy ([place]) 2013  . Cancer ([place]) 2015    skin  . Cancer of lung ([place]) 07-24-15    Family History  Problem Relation Age of Onset  . Coronary artery disease    . Hypertension    . Cancer Mother   . Depression Mother   . Diabetes Mother   . Heart disease Maternal Grandfather   . Hyperlipidemia Maternal Grandfather   . Hypertension Maternal Grandfather    Social History:  reports that he has been smoking Cigarettes.  He has a 40 pack-year smoking history. He has never used smokeless tobacco. He reports that he does not drink alcohol  or use illicit drugs.  Exam: Current vital signs: BP 143/77 mmHg  Pulse 102  Temp(Src) 97.6 F (36.4 C) (Oral)  Resp 14  Ht '5\' 11"'$  (1.803 m)  Wt 90.946 kg (200 lb 8 oz)  BMI 27.98 kg/m2  SpO2 93% Vital signs in last 24 hours: Temp:  [97.6 F (36.4 C)-98 F (36.7 C)] 97.6 F (36.4 C) (04/18 2000) Pulse Rate:  [88-104] 102 (04/18 2314) Resp:  [11-20] 14 (04/18 2314) BP: (122-155)/(69-81) 143/77 mmHg (04/18 2000) SpO2:  [84 %-99 %] 93 % (04/18 2314) Weight:  [90.946 kg (200 lb 8 oz)] 90.946 kg (200 lb 8 oz) (04/18 0500)   Physical Exam  Constitutional: coughing blood Psych: Affect appropriate to situation Eyes: No scleral injection HENT: No OP obstrucion Head: Normocephalic.  Cardiovascular: Normal rate and regular rhythm.  Respiratory: Effort normal and breath sounds normal to anterior ascultation GI: Soft.  No distension. There is no tenderness.  Skin: WDI  Neuro: Mental Status: Patient is awake, alert, oriented to person, place, month, year, and situation Patient is able to give a clear and coherent history. No signs of aphasia or neglect Cranial Nerves: II: Visual Fields are full. Pupils are equal, round, and reactive to light. III,IV, VI: EOMI without ptosis or diploplia.  V: Facial sensation is symmetric to temperature VII: Facial movement is symmetric.  VIII: hearing is intact to voice X: Uvula elevates symmetrically XI: Shoulder shrug is symmetric. XII: tongue is midline without atrophy or fasciculations.  Motor: Generalized weakness but no obvious drift on either side.  Left grip is a tiny bid weaker than right Sensory: decerased in left arm and leg Deep Tendon Reflexes: areflexic Plantars: Toes are downgoing bilaterally Cerebellar: No ataxia   Impression: likely acute lacunar stroke affecting the right BG.  However pt has expressed wish to not undergo further testing and has been in consultations with pal care.  Please call neurology if it is  determined he wants full care.  Would at this time abstain from antiplatelets given the hemoptisis.  Will sign off.

## 2015-08-07 NOTE — Progress Notes (Signed)
Inpatient Diabetes Program Recommendations  AACE/ADA: New Consensus Statement on Inpatient Glycemic Control (2015)  Target Ranges:  Prepandial:   less than 140 mg/dL      Peak postprandial:   less than 180 mg/dL (1-2 hours)      Critically ill patients:  140 - 180 mg/dL   Review of Glycemic Control Results for [name], [name] (MRN [id number]) as of 08/07/2015 11:30  Ref. Range 08/06/2015 08:09 08/06/2015 11:37 08/06/2015 16:25 08/06/2015 21:46 08/07/2015 09:00  Glucose-Capillary Latest Ref Range: 65-99 mg/dL 142 (H) 226 (H) 211 (H) 150 (H) 183 (H)   Diabetes history: DM Type 2 Outpatient Diabetes medications: Novolog insulin 40 units tid with meals + Metformin 1 gm bid + Januvia 100 mg q d Current orders for Inpatient glycemic control: Novolog correction scale resistant 0-20  Inpatient Diabetes Program Recommendations:  Noted patient not eating well. Please consider small dose of basal of Lantus 10 units daily and decrease of Novolog correction to moderate scale. If patient starts eating better, may need meal coverage.  Thank you, Nani Gasser. Caylee Vlachos, RN, MSN, CDE Inpatient Glycemic Control Team Team Pager [phone] (8am-5pm) 08/07/2015 11:32 AM

## 2015-08-08 ENCOUNTER — Inpatient Hospital Stay (HOSPITAL_COMMUNITY): Payer: Medicare Other

## 2015-08-08 DIAGNOSIS — J984 Other disorders of lung: Secondary | ICD-10-CM

## 2015-08-08 DIAGNOSIS — I639 Cerebral infarction, unspecified: Secondary | ICD-10-CM

## 2015-08-08 LAB — BASIC METABOLIC PANEL
Anion gap: 15 (ref 5–15)
BUN: 15 mg/dL (ref 6–20)
CO2: 29 mmol/L (ref 22–32)
Calcium: 8.5 mg/dL — ABNORMAL LOW (ref 8.9–10.3)
Chloride: 91 mmol/L — ABNORMAL LOW (ref 101–111)
Creatinine, Ser: 1.46 mg/dL — ABNORMAL HIGH (ref 0.61–1.24)
GFR calc Af Amer: 58 mL/min — ABNORMAL LOW (ref >60)
GFR calc non Af Amer: 50 mL/min — ABNORMAL LOW (ref >60)
Glucose, Bld: 169 mg/dL — ABNORMAL HIGH (ref 65–99)
Potassium: 4.5 mmol/L (ref 3.5–5.1)
Sodium: 135 mmol/L (ref 135–145)

## 2015-08-08 LAB — GLUCOSE, CAPILLARY
GLUCOSE-CAPILLARY: 155 mg/dL — AB (ref 65–99)
GLUCOSE-CAPILLARY: 215 mg/dL — AB (ref 65–99)
Glucose-Capillary: 143 mg/dL — ABNORMAL HIGH (ref 65–99)
Glucose-Capillary: 189 mg/dL — ABNORMAL HIGH (ref 65–99)
Glucose-Capillary: 165 mg/dL — ABNORMAL HIGH (ref 65–99)

## 2015-08-08 LAB — CBC
HCT: 29.8 % — ABNORMAL LOW (ref 39.0–52.0)
Hemoglobin: 9.4 g/dL — ABNORMAL LOW (ref 13.0–17.0)
MCH: 27.1 pg (ref 26.0–34.0)
MCHC: 31.5 g/dL (ref 30.0–36.0)
MCV: 85.9 fL (ref 78.0–100.0)
Platelets: 202 10*3/uL (ref 150–400)
RBC: 3.47 MIL/uL — ABNORMAL LOW (ref 4.22–5.81)
RDW: 14.7 % (ref 11.5–15.5)
WBC: 14 10*3/uL — ABNORMAL HIGH (ref 4.0–10.5)

## 2015-08-08 MED ORDER — FUROSEMIDE 10 MG/ML IJ SOLN
40.0000 mg | Freq: Two times a day (BID) | INTRAMUSCULAR | Status: DC
  Administered 2015-08-08 – 2015-08-10 (×5): 40 mg via INTRAVENOUS
  Filled 2015-08-08 (×5): qty 4

## 2015-08-08 NOTE — Progress Notes (Signed)
Occupational Therapy Treatment Patient Details Name: [name] MRN: [id number] DOB: 1951/01/08 Today's Date: 08/08/2015    History of present illness 65 y.o. male admitted to [facility] on 07/28/15 with chest pain and hemoptysis.  Dx with acute kidney injury, hemoptysis, multiple lung nodules/mass (refusing biopsy), HCAP, and pleuritis chest pain. 4/19 pt developed numbness and weak left arm and leg and code stroke called. Unfortunately he has current hemoptisis and could not offer tPA.Per neurology likely acute lacunar infarct Rt basal ganglia. Pt with significant PMhx of collar bone fx ~6 weeks prior to admission, essential HTN, DM2, Stevens-Johnson syndrome, PAD, neuropathy, neck surgery, back surgery, and basal cell carcinoma (2010).     OT comments  Attempted ambulation but pt stated he was too tired, but would try tomorrow. Pt desats to 84 with recliner - bed transfer on 5L. Focus of session on participating in simple ADL and LUE rehab. Pt stated he is very frustrated because he can not "remember anything". Pt assisted to bed in order to complete ultrasound and pt appeared agitated and stated "you are tricking me and locking me up". "I'm scared I won't be able to breath". Pt reoriented and reassured. Will continue to follow. Will need to work with family regarding LUE HEP,ADL/functional mobility and home safety prior to D/C.   Follow Up Recommendations  Home health OT;Supervision/Assistance - 24 hour    Equipment Recommendations  Tub/shower seat    Recommendations for Other Services      Precautions / Restrictions Precautions Precautions: Fall Precaution Comments: monitor O2 sats       Mobility Bed Mobility Overal bed mobility: Needs Assistance Bed Mobility: Sit to Supine Rolling: Supervision            Transfers Overall transfer level: Needs assistance   Transfers: Sit to/from Stand;Stand Pivot Transfers Sit to Stand: Supervision Stand pivot transfers: Supervision             Balance     Sitting balance-Leahy Scale: Good       Standing balance-Leahy Scale: Fair                     ADL       Grooming: Set up;Standing Grooming Details (indicate cue type and reason): Attempting to use L hand. gross motor function. frequently dropping items                             Functional mobility during ADLs: Supervision/safety;Cueing for safety        Vision                     Perception     Praxis      Cognition   Behavior During Therapy: Restless Overall Cognitive Status: Impaired/Different from baseline Area of Impairment: Attention;Memory;Safety/judgement;Awareness;Problem solving   Current Attention Level: Sustained Memory: Decreased short-term memory    Safety/Judgement: Decreased awareness of deficits;Decreased awareness of safety Awareness: Emergent Problem Solving: Slow processing General Comments: "i can't remember anything, it really embarasses me"    Extremity/Trunk Assessment   LUE generalized weakness. Shoulder and elbow AROM WFL. Greater distal weakness and loss of sensation. Pt has gross grasp/releasse, but is unable to demonstrate active wrist extension during activities.unable to oppose thumb to digits.  Poor in hand manipulation skills. Spontaneous use of LUE. At risk to injure L hand due to sensory impairment.  Exercises Other Exercises Other Exercises: weight bearing exercises through LUE in standing. Pt reports increased proprioception.  Other Exercises: functional grasp/release tasks Other Exercises: working on eye hand coordination to compensate for sensory loss L UE   Shoulder Instructions       General Comments      Pertinent Vitals/ Pain       Pain Assessment: Faces Faces Pain Scale: Hurts little more Pain Location: back Pain Descriptors / Indicators: Grimacing Pain Intervention(s): Limited activity within patient's tolerance;Repositioned  Home Living                                           Prior Functioning/Environment              Frequency Min 2X/week     Progress Toward Goals  OT Goals(current goals can now be found in the care plan section)  Progress towards OT goals: Progressing toward goals (goal added)  Acute Rehab OT Goals Patient Stated Goal: to feel better OT Goal Formulation: With patient Time For Goal Achievement: Aug 16, 2015 Potential to Achieve Goals: Good ADL Goals Pt Will Perform Grooming: with modified independence;sitting Pt Will Perform Upper Body Bathing: with modified independence;sitting Pt Will Perform Lower Body Bathing: with modified independence;sit to/from stand Pt Will Transfer to Toilet: with modified independence;ambulating;regular height toilet Pt/caregiver will Perform Home Exercise Program: With theraputty;Left upper extremity;With written HEP provided;With Supervision (weight bearing/activities to increase sensory awarenss)  Plan Discharge plan remains appropriate    Co-evaluation                 End of Session Equipment Utilized During Treatment: Oxygen (5L)   Activity Tolerance Patient tolerated treatment well   Patient Left in bed;with call bell/phone within reach;with bed alarm set   Nurse Communication Mobility status        Time: 2751-7001 OT Time Calculation (min): 23 min  Charges: OT General Charges $OT Visit: 1 Procedure OT Treatments $Therapeutic Activity: 23-37 mins  Ailani Governale,HILLARY 08/08/2015, 4:03 PM   [facility], OTR/L  [phone] 08/08/2015

## 2015-08-08 NOTE — Progress Notes (Addendum)
*  PRELIMINARY RESULTS* Vascular Ultrasound Carotid Duplex (Doppler) has been completed.    The right internal carotid artery exhibits 1-39% stenosis.   The left internal carotid artery exhibits elevated peak systolic velocities suggestive of 80-99% stenosis, elevated end diastolic velocities suggestive of low range 60-79% stenosis, and ICA/CCA ratio suggestive of 60-79% stenosis, however there is no evidence of significant plaque morphology to support high grade stenosis.  The left external carotid artery exhibits elevated waveforms suggestive of >50% stenosis.   Incidental finding: there are multiple heterogenous areas surrounding the neck bilaterally; etiology unknown.  Preliminary results discussed with Festus Holts, RN.  08/08/2015 3:34 PM Maudry Mayhew, RVT, RDCS, RDMS

## 2015-08-08 NOTE — Care Management Important Message (Signed)
Important Message  Patient Details  Name: [name] MRN: [id number] Date of Birth: 1950-05-26   Medicare Important Message Given:  Yes    Barb Merino Argyle Gustafson 08/08/2015, 4:06 PM

## 2015-08-08 NOTE — Progress Notes (Signed)
PROGRESS NOTE                                                                                                                                                                                                             Patient Demographics:    [name], is a 65 y.o. male, DOB - 07-Jan-1951, IRS:[id number]  Admit date - 07/28/2015     Outpatient Primary MD for the patient is Worthy Rancher, MD  LOS - 10  Outpatient Specialists: none  Chief Complaint  Patient presents with  . Shortness of Breath  . Chest Pain       Brief Narrative   65 year old male with history of anxiety, depression, tobacco abuse, OSA, hyperlipidemia, history of basal cell carcinoma, esophageal stricture status post dilatation, recent hospitalization for community-acquired pneumonia and found to have left upper lobe lung mass who was supposed to get phenobarbital bronchoscopy as outpatient presented with hemoptysis and chest pain. In the ED he was afebrile and hypotensive with systolic blood pressure of 80, hypoxic on room air in the 80s. He had leukocytosis of 18 K (was on steroid recently) with acute kidney injury (BUN of 40 and creatinine 2.48). Chest x-ray showed worsening left lower lobe consolidation and admitted to stepdown unit for HCAP and acute kidney injury. Hospital course complicated due to the element of rapid A. fib, persistent respiratory failure, ongoing hemoptysis and possible stroke.    Subjective:   Patient still having hemoptysis and left sided chest pain.Also has increased shortness of breath. Still has left-sided weakness. He had a long discussion with the oncologist yesterday and is open to radiation therapy.   Assessment  & Plan :    Active Problems: Acute hypoxic respiratory failure Likely due to pneumonia and acute diastolic CHF. Requiring 4 L O2 via nasal cannula. I have increased his Lasix dose to 40 mg twice  daily. Monitor I/O. Continue nebs. Completed antibiotic course.     Hemoptysis with left-sided chest pain Still persistent.. Left-sided pleurisy persist. Continue scheduled OxyContin (for chronic back pain), when necessary Percocet, morphine and fentanyl. Will ask palliative care team to assist with pain management. I will consult radiation oncology to evaluate for radiation therapy. Continue O2 via nasal cannula.  Large subcarinal lung mass Patient refuses diagnostic w/up including bronchoscopy or biopsy. Refuses chemotherapy stating he doesn't want to  suffer... Appreciate oncology consult. Patient's functional status is poor and is unlikely to tolerate chemotherapy. He did discuss options about getting CT of the abdomen and pelvis and to see for any abdominal lesion that could be easily biopsied for staging. Patient is not interested in that at this time. Palliative care discussing goals of care. Radiation oncology consulted for palliation.  Stroke-like symptoms Acute symptoms with left-sided weakness on 4/18. Has residual weakness. Cannot rule out stroke versus brain metastases. Patient agrees to have CVA workup. Ordered MRI brain/MRA head and carotid Doppler. Consult neurology following results. Cannot use aspirin or anticoagulation due to hemoptysis.Marland Kitchen Physical therapy.  A. fib rate control with amiodarone. Echo shows diastolic dysfunction. Cardiology following.  Healthcare associated pneumonia Completed antibiotics.  Recent cervical fracture On scheduled OxyContin.  Iron deficiency anemia Stable.  Hyperlipidemia Continue Lipitor  Anxiety and depression Continue Wellbutrin and when necessary Ativan.  Tobacco abuse Continue nicotine patch.    Diet: Heart healthy      Code Status : DO NOT RESUSCITATE  Family Communication  : Fianc at bedside  Disposition Plan  : Home with outpatient palliative care versus home hospice  Barriers For Discharge :  CVA workup pending.  Goals of care discussion with palliative care   Consults  :   Cardiology Urology Oncology [facility] CM Palliative care  Procedures  :  CT head 2-D echo MRI brain/MRA head Carotid Doppler  DVT Prophylaxis  :  SCDs  Lab Results  Component Value Date   PLT 202 08/08/2015    Antibiotics  :   Zosyn 4/10-4/14 Levaquin 4/14-4/19  Anti-infectives    Start     Dose/Rate Route Frequency Ordered Stop   08/02/15 1800  levofloxacin (LEVAQUIN) tablet 750 mg  Status:  Discontinued     750 mg Oral Every 24 hours 08/02/15 1702 08/08/15 0946   07/29/15 2200  linezolid (ZYVOX) IVPB 600 mg  Status:  Discontinued     600 mg 300 mL/hr over 60 Minutes Intravenous Every 12 hours 07/29/15 2123 08/01/15 1037   07/29/15 0115  piperacillin-tazobactam (ZOSYN) IVPB 3.375 g  Status:  Discontinued     3.375 g 12.5 mL/hr over 240 Minutes Intravenous 3 times per day 07/29/15 0109 08/02/15 1700        Objective:   Filed Vitals:   08/07/15 2103 08/08/15 0056 08/08/15 0428 08/08/15 0800  BP: 151/77 143/77 108/65   Pulse: 98 95 91   Temp: 98.9 F (37.2 C) 98.6 F (37 C) 99 F (37.2 C) 98.5 F (36.9 C)  TempSrc: Oral Oral Oral Oral  Resp: '15 19 14   '$ Height:      Weight:   90.901 kg (200 lb 6.4 oz)   SpO2: 94% 92% 90%     Wt Readings from Last 3 Encounters:  08/08/15 90.901 kg (200 lb 6.4 oz)  07/24/15 95.618 kg (210 lb 12.8 oz)  07/18/15 93.169 kg (205 lb 6.4 oz)     Intake/Output Summary (Last 24 hours) at 08/08/15 1125 Last data filed at 08/08/15 0000  Gross per 24 hour  Intake    720 ml  Output   1950 ml  Net  -1230 ml     Physical Exam  Gen: Appears fatigued HEENT: no pallor, moist mucosa, supple neck Chest: Improved breath sounds bilaterally, no rhonchi or wheeze CVS:  S1&S2 irregular, no murmurs, rubs or gallop GI: soft, NT, ND, BS+ Musculoskeletal: warm, no edema CNS: Alert and oriented, bilateral left extremity weakness 3+/5 power  Data Review:    CBC  Recent  Labs Lab 08/03/15 0600 08/07/15 0025 08/08/15 0236  WBC 10.7* 13.4* 14.0*  HGB 10.2* 9.4* 9.4*  HCT 31.5* 28.9* 29.8*  PLT 206 171 202  MCV 84.7 85.8 85.9  MCH 27.4 27.9 27.1  MCHC 32.4 32.5 31.5  RDW 14.4 14.5 14.7    Chemistries   Recent Labs Lab 08/03/15 0600 08/04/15 0957 08/04/15 2338 08/05/15 1758 08/07/15 0025 08/08/15 0236  NA 140  --  135 136 135 135  K 3.3* 3.1* 3.5 3.2* 4.4 4.5  CL 98*  --  93* 93* 96* 91*  CO2 26  --  '28 28 27 29  '$ GLUCOSE 113*  --  136* 148* 143* 169*  BUN 7  --  '15 14 13 15  '$ CREATININE 0.82  --  1.19 1.22 1.26* 1.46*  CALCIUM 8.7*  --  8.0* 8.0* 8.4* 8.5*   ------------------------------------------------------------------------------------------------------------------ No results for input(s): CHOL, HDL, LDLCALC, TRIG, CHOLHDL, LDLDIRECT in the last 72 hours.  Lab Results  Component Value Date   HGBA1C 7.4 02/27/2015   ------------------------------------------------------------------------------------------------------------------ No results for input(s): TSH, T4TOTAL, T3FREE, THYROIDAB in the last 72 hours.  Invalid input(s): FREET3 ------------------------------------------------------------------------------------------------------------------ No results for input(s): VITAMINB12, FOLATE, FERRITIN, TIBC, IRON, RETICCTPCT in the last 72 hours.  Coagulation profile No results for input(s): INR, PROTIME in the last 168 hours.  No results for input(s): DDIMER in the last 72 hours.  Cardiac Enzymes  Recent Labs Lab 08/05/15 1040 08/05/15 1758 08/05/15 2159  TROPONINI <0.03 <0.03 <0.03   ------------------------------------------------------------------------------------------------------------------    Component Value Date/Time   BNP 11.6 08/06/2015 1158    Inpatient Medications  Scheduled Meds: . amiodarone  400 mg Oral BID  . antiseptic oral rinse  7 mL Mouth Rinse BID  . atorvastatin  20 mg Oral Daily  .  buPROPion  150 mg Oral Daily  . fluticasone furoate-vilanterol  1 puff Inhalation Daily  . furosemide  40 mg Intravenous Q12H  . gabapentin  600 mg Oral BID  . insulin aspart  0-20 Units Subcutaneous TID WC  . insulin aspart  0-5 Units Subcutaneous QHS  . ipratropium  0.5 mg Nebulization TID  . isosorbide mononitrate  60 mg Oral Daily  . levalbuterol  0.63 mg Nebulization TID  . oxyCODONE  30 mg Oral TID  . pantoprazole  40 mg Oral Daily   Continuous Infusions:  PRN Meds:.fentaNYL (SUBLIMAZE) injection, fluticasone, guaiFENesin-dextromethorphan, hydrOXYzine, levalbuterol, LORazepam, metoprolol, morphine injection, ondansetron **OR** ondansetron (ZOFRAN) IV, oxyCODONE-acetaminophen, technetium TC 12M diethylenetriame-pentaacetic acid  Micro Results No results found for this or any previous visit (from the past 240 hour(s)).  Radiology Reports Dg Chest 2 View  08/06/2015  CLINICAL DATA:  Shortness of breath today. Cough and hemoptysis. Lung mass. Initial encounter. EXAM: CHEST  2 VIEW COMPARISON:  CT chest 07/24/2015. Single view of the chest 08/03/2015 and 07/28/2015. FINDINGS: The lungs are emphysematous. Interstitial and airspace opacities which are worse on the left persists but aeration is improved since the most recent examination. Mediastinal and hilar lymphadenopathy is unchanged in appearance. There are small bilateral pleural effusions. The patient's lung mass is not well scratch the patient's lung mass is better seen on the prior CT. IMPRESSION: Improved aeration bilaterally.  No new abnormality. Mediastinal and hilar lymphadenopathy in patient with known left lower lobe pulmonary nodules. Small bilateral pleural effusions. Electronically Signed   By: Inge Rise M.D.   On: 08/06/2015 13:19   Dg Chest 2 View  07/23/2015  CLINICAL DATA:  Cough and dyspnea for several weeks. Hemoptysis tonight. EXAM: CHEST  2 VIEW COMPARISON:  06/16/2015 FINDINGS: There is mild hyperinflation. There  is moderate vascular and interstitial prominence which has worsened. There is increased interstitial fluid or thickening in the basilar periphery. There is worsened alveolar opacity in the lingula. These findings may represent congestive heart failure with asymmetric alveolar edema. There is no pleural effusion. IMPRESSION: Worsened vascular and interstitial changes. Worsened lingular alveolar opacity. This may represent congestive failure but an infectious infiltrate is not entirely excluded. Electronically Signed   By: Andreas Newport M.D.   On: 07/23/2015 21:21   Ct Head Wo Contrast  08/07/2015  CLINICAL DATA:  Code stroke.  Left-sided weakness. EXAM: CT HEAD WITHOUT CONTRAST TECHNIQUE: Contiguous axial images were obtained from the base of the skull through the vertex without intravenous contrast. COMPARISON:  06/16/2015 FINDINGS: Mild cerebral atrophy. No ventricular dilatation. No mass effect or midline shift. No abnormal extra-axial fluid collections. Gray-white matter junctions are distinct. Basal cisterns are not effaced. No evidence of acute intracranial hemorrhage. No depressed skull fractures. Visualized paranasal sinuses and mastoid air cells are not opacified. IMPRESSION: No acute intracranial abnormalities. These results were called by telephone at the time of interpretation on 08/07/2015 at 1:20 am to Dr. Sallyanne Havers , who verbally acknowledged these results. Electronically Signed   By: Lucienne Capers M.D.   On: 08/07/2015 01:21   Ct Angio Chest Pe W/cm &/or Wo Cm  07/24/2015  CLINICAL DATA:  Hemoptysis. Sudden onset of chest pain yesterday afternoon. Pain radiates to the back. EXAM: CT ANGIOGRAPHY CHEST WITH CONTRAST TECHNIQUE: Multidetector CT imaging of the chest was performed using the standard protocol during bolus administration of intravenous contrast. Multiplanar CT image reconstructions and MIPs were obtained to evaluate the vascular anatomy. CONTRAST:  100 mL Isovue 370 IV COMPARISON:   Chest radiographs 5 hours prior.  Chest CT 12/20/2010 FINDINGS: Mediastinum/Lymph Nodes: Extensive multifocal mediastinal adenopathy. Enlarged lymph nodes in the right upper paratracheal, highest mediastinum, right lower paratracheal, AP window, and subcarinal stations. Subcarinal soft tissue density is unable to be separated from the esophagus, however measures at least 5.6 cm transverse dimension. Extensive left hilar soft tissue density, contiguous with multi focal nodular soft tissue density in the left lower lobe. This soft tissue density leads to narrowing of the left lower lobe pulmonary arteries without definite invasion. No discrete filling defects within the pulmonary arteries. Some degree of occlusion of the left lower lobe pulmonary vein. Thoracic aorta is normal in caliber. No pericardial effusion. There is a prominent right hilar lymph node. Heart is upper limits of normal in size, there are coronary artery calcifications. Lungs/Pleura: Multifocal innumerable left lower lobe nodules, confluent in the infrahilar region, with a masslike confluence measuring 3.2 x 3.2 cm. More superiorly this confluence measures 4.1 cm 4.1 x 3.5 cm and includes a segmental branch of the left lower lobe pulmonary artery. Associated diffuse ground-glass and ill-defined opacity in the left lower lobe. Paramediastinal nodule in the lingula measures 1.5 cm. There is fissural thickening of the interlobar fissure. Diffuse septal thickening in the left lower lobe. Bubbly dependent debris in the left mainstem bronchus extending into the lower lobe with bronchial thickening and luminal narrowing. No definite nodule in the right lung. Moderate emphysema. Trace left pleural effusion. No right pleural effusion. Upper abdomen: Portions of both adrenal glands are normal, however not entirely excluded. No evidence of focal lesion in the liver allowing for phase of contrast.  No acute abnormality in the included upper abdomen.  Musculoskeletal: Right mid clavicle fracture with minimal surrounding callus formation. No blastic or destructive lytic lesions. Review of the MIP images confirms the above findings. IMPRESSION: 1. Extensive masslike nodular opacities in the left lower lobe, with a confluent infrahilar mass measuring 3.2 x 3.2 cm, many of which are contiguous with left hilar soft tissue density. There is extensive multifocal mediastinal adenopathy. 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 These masslike opacities encase the left lower lobe pulmonary artery leads luminal narrowing but no discrete invasion or frank pulmonary embolus. Multiple ground-glass opacities in the left lower lobe, may be pneumonitis, postobstructive atelectasis or spread of malignancy. 2. Additional lingular nodule measures 1.5 cm. 3. Bubbly density in the left mainstem bronchus and left lower lobe, with left lung bronchial thickening. This is likely secretions or aspiration. 4. Moderate emphysema. Electronically Signed   By: Jeb Levering M.D.   On: 07/24/2015 02:17   Nm Pulmonary Perf And Vent  07/29/2015  CLINICAL DATA:  Patient with postsurgical chest pain and hypoxia. Patient could not elevate her arms. EXAM: NUCLEAR MEDICINE VENTILATION - PERFUSION LUNG SCAN TECHNIQUE: Ventilation images were obtained in multiple projections using inhaled aerosol Tc-41mDTPA. Perfusion images were obtained in multiple projections after intravenous injection of Tc-927mAA. RADIOPHARMACEUTICALS:  31.1 mCi Technetium-9922mPA aerosol inhalation and 4.2 mCi Technetium-9m75m IV COMPARISON:  Chest radiograph, 07/28/2015 FINDINGS: Ventilation: Decreased ventilation to left lung base corresponding to the area of airspace consolidation as well as overall decreased activity in the left lung when compared to the right. Perfusion: Normal profusion to the right lung. Decreased profusion seen throughout the left lung with no  discrete segmental defect. IMPRESSION: 1. Decreased profusion to the entire left lung. There is also decreased ventilation to the left lung and more discrete ventilatory defect the left lung base where there is consolidation on the current chest radiograph. This is an intermediate probability study for pulmonary thromboembolism based on modified prior PET criteria. Consider followup CTA of the chest if this patient can tolerate that procedure. Electronically Signed   By: DaviLajean Manes.   On: 07/29/2015 12:08   Dg Chest Port 1 View  08/03/2015  CLINICAL DATA:  64 y55r old male with shortness of breath and wheezing EXAM: PORTABLE CHEST 1 VIEW COMPARISON:  Prior chest x-ray 07/28/2015; prior chest CT 07/24/2015 FINDINGS: Interval development of diffuse bilateral predominantly interstitial opacities. There is more dense opacifications in the left base obscuring the heart margin and diaphragm. Mediastinal fullness with bulky nodular opacities in the right paratracheal stripe are similar compared to prior. Left lower lobe pulmonary nodules or now obscured. No acute osseous abnormality. IMPRESSION: 1. Interval development of mild pulmonary edema concerning for volume overload versus CHF. 2. Possible left pleural effusion. 3. Similar appearance of mediastinal and left hilar adenopathy. The known left lower lobe pulmonary nodules are now obscured from view by the edema and possible left-sided pleural effusion. Electronically Signed   By: HeatJacqulynn Cadet.   On: 08/03/2015 11:35   Dg Chest Port 1 View  07/28/2015  CLINICAL DATA:  Chest pain and dyspnea, onset today.  Hypotension. EXAM: PORTABLE CHEST 1 VIEW COMPARISON:  07/23/2015 FINDINGS: There is worsened left lower lobe consolidation in this may represent postobstructive pneumonia. Hemorrhage, aspiration, neoplasm could also produce this appearance. There is hilar and mediastinal adenopathy. Right lung is clear. No large effusions. Normal pulmonary  vasculature. Chronic right clavicle midshaft fracture  deformity. IMPRESSION: Worsened left lower lobe consolidation. Electronically Signed   By: Andreas Newport M.D.   On: 07/28/2015 22:51    Time Spent in minutes  35   Louellen Molder M.D on 08/08/2015 at 11:25 AM  Between 7am to 7pm - Pager - [phone]  After 7pm go to [email or web address] - password [facility]  [facility] -  Office  [phone]

## 2015-08-08 NOTE — Progress Notes (Signed)
Placed patient on home CPAP set at 14cm with oxygen set at 6lpm

## 2015-08-08 NOTE — Progress Notes (Signed)
Message sent to Dr. Clementeen Graham for preliminary carotid doppler results.

## 2015-08-09 ENCOUNTER — Encounter: Payer: Self-pay | Admitting: *Deleted

## 2015-08-09 ENCOUNTER — Ambulatory Visit
Admit: 2015-08-09 | Discharge: 2015-08-09 | Disposition: A | Discharge status: Home / Self Care | Payer: Medicare Other | Attending: Radiation Oncology | Admitting: Radiation Oncology

## 2015-08-09 DIAGNOSIS — C3432 Malignant neoplasm of lower lobe, left bronchus or lung: Secondary | ICD-10-CM

## 2015-08-09 LAB — GLUCOSE, CAPILLARY
GLUCOSE-CAPILLARY: 156 mg/dL — AB (ref 65–99)
GLUCOSE-CAPILLARY: 222 mg/dL — AB (ref 65–99)
GLUCOSE-CAPILLARY: 209 mg/dL — AB (ref 65–99)
Glucose-Capillary: 161 mg/dL — ABNORMAL HIGH (ref 65–99)

## 2015-08-09 LAB — MRSA PCR SCREENING: MRSA by PCR: NEGATIVE

## 2015-08-09 MED ORDER — GABAPENTIN 300 MG PO CAPS
600.0000 mg | ORAL_CAPSULE | Freq: Two times a day (BID) | ORAL | Status: DC
  Administered 2015-08-09 – 2015-08-11 (×4): 600 mg via ORAL
  Filled 2015-08-09 (×4): qty 2

## 2015-08-09 MED ORDER — HYDROMORPHONE HCL 1 MG/ML IJ SOLN
1.0000 mg | INTRAMUSCULAR | Status: DC | PRN
  Administered 2015-08-09 – 2015-08-10 (×10): 1 mg via INTRAVENOUS
  Filled 2015-08-09 (×11): qty 1

## 2015-08-09 MED ORDER — MORPHINE SULFATE (PF) 2 MG/ML IV SOLN: 2.0000 mg | INTRAVENOUS | Status: DC | PRN

## 2015-08-09 NOTE — Progress Notes (Signed)
Report given to [facility] and [facility] RN Langley Gauss. [facility] en route - Berdene Askari,RN

## 2015-08-09 NOTE — Progress Notes (Signed)
Physical Therapy Treatment Patient Details Name: [name] MRN: [id number] DOB: Sep 02, 1950 Today's Date: 08/09/2015    History of Present Illness 65 y.o. male admitted to [facility] on 07/28/15 with chest pain and hemoptysis.  Dx with acute kidney injury, hemoptysis, multiple lung nodules/mass (refusing biopsy), HCAP, and pleuritis chest pain. 4/19 pt developed numbness and weak left arm and leg and code stroke called. 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  Pt with significant PMhx of collar bone fx ~6 weeks prior to admission, essential HTN, DM2, Stevens-Johnson syndrome, PAD, neuropathy, neck surgery, back surgery, and basal cell carcinoma (2010).      PT Comments    Patient anxious re: move to [facility]. LLE strength improved and able to ambulate with RW. Continues to move too quickly and required education re: reasons for slowing down. When static or moving slower, SaO2 90-95% on 6L. Drops to 83% on 6L when moving quickly. Recovered to 89% within 2 minutes with pursed lip breathing.    Follow Up Recommendations  Supervision/Assistance - 24 hour     Equipment Recommendations  Other (comment) (TBA)    Recommendations for Other Services       Precautions / Restrictions Precautions Precautions: Fall Precaution Comments: monitor O2 sats    Mobility  Bed Mobility Overal bed mobility: Needs Assistance Bed Mobility: Sit to Supine       Sit to supine: Min guard   General bed mobility comments: nearly needed assist to get legs onto bed  Transfers Overall transfer level: Needs assistance Equipment used: Rolling walker (2 wheeled) Transfers: Sit to/from Stand Sit to Stand: Min assist         General transfer comment: vc for safe/proper use of RW; moves quickly and unsteady  Ambulation/Gait Ambulation/Gait assistance: Min assist Ambulation Distance (Feet): 12  Feet Assistive device: Rolling walker (2 wheeled) Gait Pattern/deviations: Step-through pattern;Decreased stride length;Trunk flexed Gait velocity: too fast for his pulmonary status Gait velocity interpretation: at or above normal speed for age/gender General Gait Details: limited by lines and SaO2   Stairs            Wheelchair Mobility    Modified Rankin (Stroke Patients Only)       Balance     Sitting balance-Leahy Scale: Good     Standing balance support: Bilateral upper extremity supported Standing balance-Leahy Scale: Poor                      Cognition Arousal/Alertness: Awake/alert Behavior During Therapy: Restless;Impulsive Overall Cognitive Status: Impaired/Different from baseline Area of Impairment: Attention;Memory;Safety/judgement;Awareness;Problem solving   Current Attention Level: Sustained Memory: Decreased short-term memory   Safety/Judgement: Decreased awareness of deficits;Decreased awareness of safety Awareness: Emergent Problem Solving: Slow processing      Exercises      General Comments General comments (skin integrity, edema, etc.): wife present      Pertinent Vitals/Pain Pain Assessment: Faces Faces Pain Scale: Hurts even more Pain Location: back Pain Descriptors / Indicators: Grimacing;Aching Pain Intervention(s): Limited activity within patient's tolerance;Monitored during session;Premedicated before session;Repositioned    Home Living                      Prior Function            PT Goals (current goals can now be found in the care plan section) Acute Rehab PT  Goals Patient Stated Goal: to feel better Time For Goal Achievement: 08/13/15 Progress towards PT goals: Progressing toward goals    Frequency  Min 3X/week    PT Plan Current plan remains appropriate    Co-evaluation             End of Session Equipment Utilized During Treatment: Gait belt;Oxygen Activity Tolerance: Treatment  limited secondary to medical complications (Comment) (decr SaO2) Patient left: in bed;with call bell/phone within reach;with bed alarm set;with family/visitor present     Time: 7672-0947 PT Time Calculation (min) (ACUTE ONLY): 17 min  Charges:  $Gait Training: 8-22 mins                    G Codes:      Reida Hem 2015-08-23, 4:19 PM Pager [phone]

## 2015-08-09 NOTE — Progress Notes (Addendum)
PROGRESS NOTE                                                                                                                                                                                                             Patient Demographics:    [name], is a 65 y.o. male, DOB - [birth year], RSW:[id number]  Admit date - 07/28/2015     Outpatient Primary MD for the patient is Worthy Rancher, MD  LOS - 11  Outpatient Specialists: none  Chief Complaint  Patient presents with  . Shortness of Breath  . Chest Pain       Brief Narrative   65 year old male with history of anxiety, depression, tobacco abuse, OSA, hyperlipidemia, history of basal cell carcinoma, esophageal stricture status post dilatation, recent hospitalization for community-acquired pneumonia and found to have left upper lobe lung mass who was supposed to get  bronchoscopy as outpatient presented with hemoptysis and chest pain. In the ED he was afebrile and hypotensive with systolic blood pressure of 80, hypoxic on room air in the 80s. He had leukocytosis of 18 K (was on steroid recently) with acute kidney injury (BUN of 40 and creatinine 2.48). Chest x-ray showed worsening left lower lobe consolidation and admitted to stepdown unit for HCAP and acute kidney injury. Hospital course complicated due to the element of rapid A. fib, persistent respiratory failure, ongoing hemoptysis and Strokelike symptoms    Subjective:   Patient still feels short of breath and has left-sided chest pain (controlled on current pain regimen). Hemoptysis is better.   Assessment  & Plan :    Active Problems: Acute hypoxic respiratory failure Likely due to pneumonia and acute diastolic CHF. Still requiring 4 L O2 via nasal cannula. Continue IV Lasix  40 mg twice daily. Monitor I/O. Continue nebs. Completed antibiotic course.     Hemoptysis with left-sided chest  pain Secondary to lung mass. Continue scheduled OxyContin (for chronic back pain), when necessary Percocet, and fentanyl. Switched morphine 2 low-dose Dilaudid given headaches. Palliative care team to assist with pain management and regimen upon discharge. Radiation oncology consulted to evaluate for radiation to alleviate pain and hemoptysis.   Large subcarinal lung mass Refused diagnostic w/up including bronchoscopy or biopsy. Refuses chemotherapy stating he doesn't want to suffer... Appreciate oncology consult. Patient's functional status is poor and is unlikely  to tolerate chemotherapy.  Patient thinking about CT abdomen and pelvis to see for any metastases and any lesion that is convenient for biopsy. Radiation oncology to follow.  Stroke-like symptoms Acute symptoms with left-sided weakness on 4/18. Has residual weakness which is improving. MRI brain/MRA head without any infarct. They show a 1 cm bone lesion in the left clivus suggestive of bony metastases. Since the study was done without contrast could not take any metastatic disease.  Given technical presentations and absence of ischemia or hemorrhage to support stroke brain metastases is highly likely.  A. fib rate control with amiodarone. Echo shows diastolic dysfunction. Appreciate cardiology input.  Healthcare associated pneumonia Completed antibiotics.  Recent cervical fracture On scheduled OxyContin.  Iron deficiency anemia Stable.  Hyperlipidemia Continue Lipitor  Anxiety and depression Continue Wellbutrin and when necessary Ativan.  Tobacco abuse Continue nicotine patch.    Diet: Heart healthy      Code Status : DO NOT RESUSCITATE  Family Communication  : Fianc at bedside  Disposition Plan  : Home with outpatient palliative care versus home hospice  Barriers For Discharge :  Pending final goals of care discussion with palliative care and radiation oncology evaluation. Still requiring 4 L O2 via nasal  cannula, needs IV Lasix for diuresis. Should be ready for discharge by early next week.  Consults  :   Cardiology Urology Oncology [facility] CM Palliative care  Procedures  :  CT head 2-D echo MRI brain/MRA head Carotid Doppler  DVT Prophylaxis  :  SCDs  Lab Results  Component Value Date   PLT 202 08/08/2015    Antibiotics  :   Zosyn 4/10-4/14 Levaquin 4/14-4/19  Anti-infectives    Start     Dose/Rate Route Frequency Ordered Stop   08/02/15 1800  levofloxacin (LEVAQUIN) tablet 750 mg  Status:  Discontinued     750 mg Oral Every 24 hours 08/02/15 1702 08/08/15 0946   07/29/15 2200  linezolid (ZYVOX) IVPB 600 mg  Status:  Discontinued     600 mg 300 mL/hr over 60 Minutes Intravenous Every 12 hours 07/29/15 2123 08/01/15 1037   07/29/15 0115  piperacillin-tazobactam (ZOSYN) IVPB 3.375 g  Status:  Discontinued     3.375 g 12.5 mL/hr over 240 Minutes Intravenous 3 times per day 07/29/15 0109 08/02/15 1700        Objective:   Filed Vitals:   08/09/15 0900 08/09/15 0918 08/09/15 1000 08/09/15 1100  BP:      Pulse: 98  99 107  Temp:      TempSrc:      Resp: '24  14 17  '$ Height:      Weight:      SpO2: 92% 88% 97% 86%    Wt Readings from Last 3 Encounters:  08/09/15 91.6 kg (201 lb 15.1 oz)  07/24/15 95.618 kg (210 lb 12.8 oz)  07/18/15 93.169 kg (205 lb 6.4 oz)     Intake/Output Summary (Last 24 hours) at 08/09/15 1127 Last data filed at 08/09/15 1000  Gross per 24 hour  Intake    960 ml  Output   1900 ml  Net   -940 ml     Physical Exam  Gen: Appears fatigued HEENT: no pallor, moist mucosa, supple neck Chest: Improved breath sounds bilaterally, no rhonchi or wheeze CVS:  S1&S2 Regular no murmurs, rubs or gallop GI: soft, NT, ND, BS+ Musculoskeletal: warm, no edema CNS: Alert and oriented, bilateral left extremity weakness 3+/5 power (improved)    Data  Review:    CBC  Recent Labs Lab 08/03/15 0600 08/07/15 0025 08/08/15 0236  WBC 10.7* 13.4*  14.0*  HGB 10.2* 9.4* 9.4*  HCT 31.5* 28.9* 29.8*  PLT 206 171 202  MCV 84.7 85.8 85.9  MCH 27.4 27.9 27.1  MCHC 32.4 32.5 31.5  RDW 14.4 14.5 14.7    Chemistries   Recent Labs Lab 08/03/15 0600 08/04/15 0957 08/04/15 2338 08/05/15 1758 08/07/15 0025 08/08/15 0236  NA 140  --  135 136 135 135  K 3.3* 3.1* 3.5 3.2* 4.4 4.5  CL 98*  --  93* 93* 96* 91*  CO2 26  --  '28 28 27 29  '$ GLUCOSE 113*  --  136* 148* 143* 169*  BUN 7  --  '15 14 13 15  '$ CREATININE 0.82  --  1.19 1.22 1.26* 1.46*  CALCIUM 8.7*  --  8.0* 8.0* 8.4* 8.5*   ------------------------------------------------------------------------------------------------------------------ No results for input(s): CHOL, HDL, LDLCALC, TRIG, CHOLHDL, LDLDIRECT in the last 72 hours.  Lab Results  Component Value Date   HGBA1C 7.4 02/27/2015   ------------------------------------------------------------------------------------------------------------------ No results for input(s): TSH, T4TOTAL, T3FREE, THYROIDAB in the last 72 hours.  Invalid input(s): FREET3 ------------------------------------------------------------------------------------------------------------------ No results for input(s): VITAMINB12, FOLATE, FERRITIN, TIBC, IRON, RETICCTPCT in the last 72 hours.  Coagulation profile No results for input(s): INR, PROTIME in the last 168 hours.  No results for input(s): DDIMER in the last 72 hours.  Cardiac Enzymes  Recent Labs Lab 08/05/15 1040 08/05/15 1758 08/05/15 2159  TROPONINI <0.03 <0.03 <0.03   ------------------------------------------------------------------------------------------------------------------    Component Value Date/Time   BNP 11.6 08/06/2015 1158    Inpatient Medications  Scheduled Meds: . amiodarone  400 mg Oral BID  . antiseptic oral rinse  7 mL Mouth Rinse BID  . atorvastatin  20 mg Oral Daily  . buPROPion  150 mg Oral Daily  . fluticasone furoate-vilanterol  1 puff  Inhalation Daily  . furosemide  40 mg Intravenous Q12H  . gabapentin  600 mg Oral BID  . insulin aspart  0-20 Units Subcutaneous TID WC  . insulin aspart  0-5 Units Subcutaneous QHS  . ipratropium  0.5 mg Nebulization TID  . isosorbide mononitrate  60 mg Oral Daily  . levalbuterol  0.63 mg Nebulization TID  . oxyCODONE  30 mg Oral TID  . pantoprazole  40 mg Oral Daily   Continuous Infusions:  PRN Meds:.fentaNYL (SUBLIMAZE) injection, fluticasone, guaiFENesin-dextromethorphan, HYDROmorphone (DILAUDID) injection, hydrOXYzine, levalbuterol, LORazepam, metoprolol, ondansetron **OR** ondansetron (ZOFRAN) IV, oxyCODONE-acetaminophen, technetium TC 39M diethylenetriame-pentaacetic acid  Micro Results No results found for this or any previous visit (from the past 240 hour(s)).  Radiology Reports Dg Chest 2 View  08/06/2015  CLINICAL DATA:  Shortness of breath today. Cough and hemoptysis. Lung mass. Initial encounter. EXAM: CHEST  2 VIEW COMPARISON:  CT chest 07/24/2015. Single view of the chest 08/03/2015 and 07/28/2015. FINDINGS: The lungs are emphysematous. Interstitial and airspace opacities which are worse on the left persists but aeration is improved since the most recent examination. Mediastinal and hilar lymphadenopathy is unchanged in appearance. There are small bilateral pleural effusions. The patient's lung mass is not well scratch the patient's lung mass is better seen on the prior CT. IMPRESSION: Improved aeration bilaterally.  No new abnormality. Mediastinal and hilar lymphadenopathy in patient with known left lower lobe pulmonary nodules. Small bilateral pleural effusions. Electronically Signed   By: Inge Rise M.D.   On: 08/06/2015 13:19   Dg Chest 2 View  07/23/2015  CLINICAL DATA:  Cough and dyspnea for several weeks. Hemoptysis tonight. EXAM: CHEST  2 VIEW COMPARISON:  06/16/2015 FINDINGS: There is mild hyperinflation. There is moderate vascular and interstitial prominence which  has worsened. There is increased interstitial fluid or thickening in the basilar periphery. There is worsened alveolar opacity in the lingula. These findings may represent congestive heart failure with asymmetric alveolar edema. There is no pleural effusion. IMPRESSION: Worsened vascular and interstitial changes. Worsened lingular alveolar opacity. This may represent congestive failure but an infectious infiltrate is not entirely excluded. Electronically Signed   By: Andreas Newport M.D.   On: 07/23/2015 21:21   Ct Head Wo Contrast  08/07/2015  CLINICAL DATA:  Code stroke.  Left-sided weakness. EXAM: CT HEAD WITHOUT CONTRAST TECHNIQUE: Contiguous axial images were obtained from the base of the skull through the vertex without intravenous contrast. COMPARISON:  06/16/2015 FINDINGS: Mild cerebral atrophy. No ventricular dilatation. No mass effect or midline shift. No abnormal extra-axial fluid collections. Gray-white matter junctions are distinct. Basal cisterns are not effaced. No evidence of acute intracranial hemorrhage. No depressed skull fractures. Visualized paranasal sinuses and mastoid air cells are not opacified. IMPRESSION: No acute intracranial abnormalities. These results were called by telephone at the time of interpretation on 08/07/2015 at 1:20 am to Dr. Sallyanne Havers , who verbally acknowledged these results. Electronically Signed   By: Lucienne Capers M.D.   On: 08/07/2015 01:21   Ct Angio Chest Pe W/cm &/or Wo Cm  07/24/2015  CLINICAL DATA:  Hemoptysis. Sudden onset of chest pain yesterday afternoon. Pain radiates to the back. EXAM: CT ANGIOGRAPHY CHEST WITH CONTRAST TECHNIQUE: Multidetector CT imaging of the chest was performed using the standard protocol during bolus administration of intravenous contrast. Multiplanar CT image reconstructions and MIPs were obtained to evaluate the vascular anatomy. CONTRAST:  100 mL Isovue 370 IV COMPARISON:  Chest radiographs 5 hours prior.  Chest CT 12/20/2010  FINDINGS: Mediastinum/Lymph Nodes: Extensive multifocal mediastinal adenopathy. Enlarged lymph nodes in the right upper paratracheal, highest mediastinum, right lower paratracheal, AP window, and subcarinal stations. Subcarinal soft tissue density is unable to be separated from the esophagus, however measures at least 5.6 cm transverse dimension. Extensive left hilar soft tissue density, contiguous with multi focal nodular soft tissue density in the left lower lobe. This soft tissue density leads to narrowing of the left lower lobe pulmonary arteries without definite invasion. No discrete filling defects within the pulmonary arteries. Some degree of occlusion of the left lower lobe pulmonary vein. Thoracic aorta is normal in caliber. No pericardial effusion. There is a prominent right hilar lymph node. Heart is upper limits of normal in size, there are coronary artery calcifications. Lungs/Pleura: Multifocal innumerable left lower lobe nodules, confluent in the infrahilar region, with a masslike confluence measuring 3.2 x 3.2 cm. More superiorly this confluence measures 4.1 cm 4.1 x 3.5 cm and includes a segmental branch of the left lower lobe pulmonary artery. Associated diffuse ground-glass and ill-defined opacity in the left lower lobe. Paramediastinal nodule in the lingula measures 1.5 cm. There is fissural thickening of the interlobar fissure. Diffuse septal thickening in the left lower lobe. Bubbly dependent debris in the left mainstem bronchus extending into the lower lobe with bronchial thickening and luminal narrowing. No definite nodule in the right lung. Moderate emphysema. Trace left pleural effusion. No right pleural effusion. Upper abdomen: Portions of both adrenal glands are normal, however not entirely excluded. No evidence of focal lesion in the liver allowing for phase of contrast.  No acute abnormality in the included upper abdomen. Musculoskeletal: Right mid clavicle fracture with minimal  surrounding callus formation. No blastic or destructive lytic lesions. Review of the MIP images confirms the above findings. IMPRESSION: 1. Extensive masslike nodular opacities in the left lower lobe, with a confluent infrahilar mass measuring 3.2 x 3.2 cm, many of which are contiguous with left hilar soft tissue density. There is extensive multifocal mediastinal adenopathy. 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 These masslike opacities encase the left lower lobe pulmonary artery leads luminal narrowing but no discrete invasion or frank pulmonary embolus. Multiple ground-glass opacities in the left lower lobe, may be pneumonitis, postobstructive atelectasis or spread of malignancy. 2. Additional lingular nodule measures 1.5 cm. 3. Bubbly density in the left mainstem bronchus and left lower lobe, with left lung bronchial thickening. This is likely secretions or aspiration. 4. Moderate emphysema. Electronically Signed   By: Jeb Levering M.D.   On: 07/24/2015 02:17   Mr Virgel Paling Wo Contrast  08/08/2015  CLINICAL DATA:  65 year old male with left side weakness. Recently discovered left lung mass. Initial encounter. EXAM: MRI HEAD WITHOUT CONTRAST MRA HEAD WITHOUT CONTRAST TECHNIQUE: Multiplanar, multiecho pulse sequences of the brain and surrounding structures were obtained without intravenous contrast. Angiographic images of the head were obtained using MRA technique without contrast. COMPARISON:  Noncontrast head CT 08/07/2015. Cervical spine MRI 09/13/2007. FINDINGS: MRI HEAD FINDINGS Heterogeneous diffusion weighted imaging with no convincing diffusion restriction. Major intracranial vascular flow voids are preserved. Distal left vertebral artery appears dominant. No midline shift, mass effect, evidence of mass lesion, ventriculomegaly, extra-axial collection or acute intracranial hemorrhage. Cervicomedullary junction and pituitary are within normal  limits. Pear[place] Cables and white matter signal is within normal limits for age throughout the brain. No contrast administered Visible internal auditory structures appear normal. Mastoids are clear. Small volume retained secretions in the nasopharynx. Trace paranasal sinus mucosal thickening. Negative orbit and scalp soft tissues. Stable visualized cervical spine. There is a 10 mm bone lesion in the left clivus which appears to be new since 2009 and demonstrates restricted diffusion (series 4, image 9 and series 3, image 14). No other suspicious osseous lesion identified. MRA HEAD FINDINGS Study is intermittently degraded by motion artifact despite repeated imaging attempts. Antegrade flow in the posterior circulation with dominant distal left vertebral artery, nondominant diminutive right vertebral artery. Normal left PICA origin. Normal vertebrobasilar junction. Normal dominant appearing right AICA origin. No basilar stenosis. Normal SCA and PCA origins. Normal PCA branches. Antegrade flow in both ICA siphons. No siphon stenosis. Ophthalmic artery origins are normal. Normal carotid termini, MCA and ACA origins. Anterior communicating artery with median artery of the corpus callosum is noted. Visualized ACA branches are within normal limits. Bilateral MCA M1 segments, MCA bifurcations, and visualized bilateral MCA branches are within normal limits. IMPRESSION: 1. 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 2. No evidence of metastatic disease to the brain on this noncontrast exam, but early metastatic disease to the brain cannot be excluded in the absence of intravenous contrast. 3.  No acute abnormality of the brain is identified. 4.  Negative intracranial MRA. Electronically Signed   By: Genevie Ann M.D.   On: 08/08/2015 11:54   Mr Brain Wo Contrast  08/08/2015  CLINICAL DATA:  65 year old male with left side weakness. Recently discovered left lung mass. Initial encounter. EXAM: MRI HEAD WITHOUT CONTRAST MRA  HEAD WITHOUT CONTRAST TECHNIQUE: Multiplanar, multiecho pulse sequences  of the brain and surrounding structures were obtained without intravenous contrast. Angiographic images of the head were obtained using MRA technique without contrast. COMPARISON:  Noncontrast head CT 08/07/2015. Cervical spine MRI 09/13/2007. FINDINGS: MRI HEAD FINDINGS Heterogeneous diffusion weighted imaging with no convincing diffusion restriction. Major intracranial vascular flow voids are preserved. Distal left vertebral artery appears dominant. No midline shift, mass effect, evidence of mass lesion, ventriculomegaly, extra-axial collection or acute intracranial hemorrhage. Cervicomedullary junction and pituitary are within normal limits. Pear[place] Cables and white matter signal is within normal limits for age throughout the brain. No contrast administered Visible internal auditory structures appear normal. Mastoids are clear. Small volume retained secretions in the nasopharynx. Trace paranasal sinus mucosal thickening. Negative orbit and scalp soft tissues. Stable visualized cervical spine. There is a 10 mm bone lesion in the left clivus which appears to be new since 2009 and demonstrates restricted diffusion (series 4, image 9 and series 3, image 14). No other suspicious osseous lesion identified. MRA HEAD FINDINGS Study is intermittently degraded by motion artifact despite repeated imaging attempts. Antegrade flow in the posterior circulation with dominant distal left vertebral artery, nondominant diminutive right vertebral artery. Normal left PICA origin. Normal vertebrobasilar junction. Normal dominant appearing right AICA origin. No basilar stenosis. Normal SCA and PCA origins. Normal PCA branches. Antegrade flow in both ICA siphons. No siphon stenosis. Ophthalmic artery origins are normal. Normal carotid termini, MCA and ACA origins. Anterior communicating artery with median artery of the corpus callosum is noted. Visualized ACA branches are  within normal limits. Bilateral MCA M1 segments, MCA bifurcations, and visualized bilateral MCA branches are within normal limits. IMPRESSION: 1. 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 2. No evidence of metastatic disease to the brain on this noncontrast exam, but early metastatic disease to the brain cannot be excluded in the absence of intravenous contrast. 3.  No acute abnormality of the brain is identified. 4.  Negative intracranial MRA. Electronically Signed   By: Genevie Ann M.D.   On: 08/08/2015 11:54   Nm Pulmonary Perf And Vent  07/29/2015  CLINICAL DATA:  Patient with postsurgical chest pain and hypoxia. Patient could not elevate her arms. EXAM: NUCLEAR MEDICINE VENTILATION - PERFUSION LUNG SCAN TECHNIQUE: Ventilation images were obtained in multiple projections using inhaled aerosol Tc-107mDTPA. Perfusion images were obtained in multiple projections after intravenous injection of Tc-949mAA. RADIOPHARMACEUTICALS:  31.1 mCi Technetium-9946mPA aerosol inhalation and 4.2 mCi Technetium-79m50m IV COMPARISON:  Chest radiograph, 07/28/2015 FINDINGS: Ventilation: Decreased ventilation to left lung base corresponding to the area of airspace consolidation as well as overall decreased activity in the left lung when compared to the right. Perfusion: Normal profusion to the right lung. Decreased profusion seen throughout the left lung with no discrete segmental defect. IMPRESSION: 1. Decreased profusion to the entire left lung. There is also decreased ventilation to the left lung and more discrete ventilatory defect the left lung base where there is consolidation on the current chest radiograph. This is an intermediate probability study for pulmonary thromboembolism based on modified prior PET criteria. Consider followup CTA of the chest if this patient can tolerate that procedure. Electronically Signed   By: DaviLajean Manes.   On: 07/29/2015 12:08   Dg Chest Port 1 View  08/03/2015   CLINICAL DATA:  64 y29r old male with shortness of breath and wheezing EXAM: PORTABLE CHEST 1 VIEW COMPARISON:  Prior chest x-ray 07/28/2015; prior chest CT 07/24/2015 FINDINGS: Interval development of diffuse bilateral predominantly  interstitial opacities. There is more dense opacifications in the left base obscuring the heart margin and diaphragm. Mediastinal fullness with bulky nodular opacities in the right paratracheal stripe are similar compared to prior. Left lower lobe pulmonary nodules or now obscured. No acute osseous abnormality. IMPRESSION: 1. Interval development of mild pulmonary edema concerning for volume overload versus CHF. 2. Possible left pleural effusion. 3. Similar appearance of mediastinal and left hilar adenopathy. The known left lower lobe pulmonary nodules are now obscured from view by the edema and possible left-sided pleural effusion. Electronically Signed   By: Jacqulynn Cadet M.D.   On: 08/03/2015 11:35   Dg Chest Port 1 View  07/28/2015  CLINICAL DATA:  Chest pain and dyspnea, onset today.  Hypotension. EXAM: PORTABLE CHEST 1 VIEW COMPARISON:  07/23/2015 FINDINGS: There is worsened left lower lobe consolidation in this may represent postobstructive pneumonia. Hemorrhage, aspiration, neoplasm could also produce this appearance. There is hilar and mediastinal adenopathy. Right lung is clear. No large effusions. Normal pulmonary vasculature. Chronic right clavicle midshaft fracture deformity. IMPRESSION: Worsened left lower lobe consolidation. Electronically Signed   By: Andreas Newport M.D.   On: 07/28/2015 22:51    Time Spent in minutes  35   Louellen Molder M.D on 08/09/2015 at 11:27 AM  Between 7am to 7pm - Pager - [phone]  After 7pm go to [email or web address] - password [facility]  [facility] -  Office  [phone]

## 2015-08-09 NOTE — Progress Notes (Signed)
Pt may transfer to [facility] for radiation oncology. Staff awaits bed.

## 2015-08-09 NOTE — Progress Notes (Signed)
Patient seen by radiation oncologist and recommended transfer to [facility] for radiation therapy. Patient agrees. Will need stepdown bed at [facility]. Signed out to my partner Dr. Broadus John at [facility].

## 2015-08-09 NOTE — Progress Notes (Signed)
Called [facility] and spoke with nurse Roselyn Reef.  Informed her that this patient has a scheduled CT simulation at 2 pm and would need to be here by 1:30 pm.  Nurse reports they are planning on transferring him to [facility].  Notified [facility] of appointment and possible change of facility. Simulation notified as well.

## 2015-08-09 NOTE — Consult Note (Signed)
[place]         [phone] ________________________________  Initial inpatient Consultation  Name: [name] MRN: [id number]  Date: 07/28/2015  DOB: July 04, 1950  REFERRING PHYSICIAN: No ref. provider found  DIAGNOSIS: 65 yo man with a Left Lower Lung Mass and Massive Mediastinal Adenopathy pending Biopsy at risk for SVC syndrome and airway issues.    ICD-9-CM ICD-10-CM   1. AKI (acute kidney injury) ([place]) 584.9 N17.9   2. Hemoptysis 786.30 R04.2 DG Chest [facility] 1 View     DG Chest Port 1 View  3. Chest pain 786.50 R07.9 NM Pulmonary Perf and Vent     NM Pulmonary Perf and Vent  4. Chest pain on breathing 786.52 R07.1 NM Pulmonary Perf and Vent  5. Shortness of breath 786.05 R06.02 VAS [place] LOWER EXTREMITY VENOUS (DVT)     VAS [place] LOWER EXTREMITY VENOUS (DVT)  6. Dyspnea 786.09 R06.00 DG CHEST PORT 1 VIEW     DG CHEST PORT 1 VIEW  7. Lung mass 786.6 R91.8 DG Chest 2 View     DG Chest 2 View  8. Weakness of left arm 729.89 R29.898 CT Head Wo Contrast     CT Head Wo Contrast  9. Stroke [facility]) 434.91 I63.9     HISTORY OF PRESENT ILLNESS::[name] is a 65 y.o. male who presented to the hospital initially on 07/23/2015 with chest pain shortness of breath and hemoptysis. He had a chest x-ray that showed a left sided lung mass. 07/24/15 Chest CT showed extensive masslike nodular opacities in the left lower lobe, with a confluent infrahilar mass measuring 3.2 x 3.2 cm. There is extensive multifocal mediastinal adenopathy. 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 These masslike opacities encase the left lower lobe pulmonary artery leads luminal narrowing but no discrete invasion or frank pulmonary embolus. Multiple ground-glass opacities in the left lower lobe, may be pneumonitis, postobstructive atelectasis or spread of Malignancy. Additional lingular nodule measures 1.5 cm.    He was seen by pulmonology and  set up for outpatient follow-up pursue a bronchoscopy evaluation and biopsy. He was treated with Levaquin and Flagyl for possible postobstructive pneumonia and then with Augmentin. Patient was discharged to home on 07/27/15, but unfortunately got readmitted on 07/28/2015 with continued chest pain and increasing hemoptysis.  He has had sputum samples showing no malignancy.  He had CVA symptomatology in the form of left-sided weakness, and head CT in addition to brain MRI show no mets, no CVA. 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  PREVIOUS RADIATION THERAPY: No  Past Medical History  Diagnosis Date  . Essential hypertension, benign   . Type 2 diabetes mellitus ([place])   . Mixed hyperlipidemia   . OSA (obstructive sleep apnea)   . Partial small bowel obstruction ([place])   . 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 Coronary atherosclerosis of native coronary artery     Nonobstructive  . Erythrocytosis   . Peripheral arterial disease (HCC)     70% right iliac  . History of MRSA infection   . Neuropathy ([employer]) 2013  . Cancer ([place]) 2015    skin  . Cancer of lung ([place]) 07-24-15  :   Past Surgical History  Procedure Laterality Date  . Appendectomy    . Neck surgery    . Back surgery    . Hemorrhoid surgery    . Vasectomy    :  Current facility-administered medications:  .  amiodarone (PACERONE) tablet 400 mg, 400 mg, Oral, BID, Minus Breeding, MD, 400 mg at 08/09/15 0858 .  antiseptic oral rinse (CPC / CETYLPYRIDINIUM CHLORIDE 0.05%) solution 7 mL, 7 mL, Mouth Rinse, BID, Velvet Bathe, MD, 7 mL at 08/09/15 1117 .  atorvastatin (LIPITOR) tablet 20 mg, 20 mg, Oral, Daily, Alesia Richards, MD, 20 mg at 08/08/15 1715 .  buPROPion (WELLBUTRIN XL) 24 hr tablet 150 mg, 150 mg, Oral, Daily, Alesia Richards, MD, 150 mg at 08/09/15 0859 .  fentaNYL (SUBLIMAZE) injection 50 mcg, 50 mcg, Intravenous, Q3H PRN, Velvet Bathe, MD, 50 mcg at 08/09/15 1117 .  fluticasone (FLONASE) 50 MCG/ACT nasal  spray 1 spray, 1 spray, Each Nare, BID PRN, Alesia Richards, MD .  fluticasone furoate-vilanterol (BREO ELLIPTA) 100-25 MCG/INH 1 puff, 1 puff, Inhalation, Daily, Alesia Richards, MD, 1 puff at 08/09/15 0918 .  furosemide (LASIX) injection 40 mg, 40 mg, Intravenous, Q12H, Nishant Dhungel, MD, 40 mg at 08/09/15 0858 .  gabapentin (NEURONTIN) tablet 600 mg, 600 mg, Oral, BID, Alesia Richards, MD, 600 mg at 08/09/15 0859 .  guaiFENesin-dextromethorphan (ROBITUSSIN DM) 100-10 MG/5ML syrup 5 mL, 5 mL, Oral, Q4H PRN, Bincy S Varughese, NP, 5 mL at 07/31/15 1717 .  HYDROmorphone (DILAUDID) injection 1 mg, 1 mg, Intravenous, Q3H PRN, Nishant Dhungel, MD, 1 mg at 08/09/15 1211 .  hydrOXYzine (ATARAX/VISTARIL) tablet 25 mg, 25 mg, Oral, TID PRN, Velvet Bathe, MD, 25 mg at 08/07/15 2146 .  insulin aspart (novoLOG) injection 0-20 Units, 0-20 Units, Subcutaneous, TID WC, Alesia Richards, MD, 7 Units at 08/09/15 1211 .  insulin aspart (novoLOG) injection 0-5 Units, 0-5 Units, Subcutaneous, QHS, Alesia Richards, MD, 2 Units at 08/08/15 2218 .  ipratropium (ATROVENT) nebulizer solution 0.5 mg, 0.5 mg, Nebulization, TID, Velvet Bathe, MD, 0.5 mg at 08/09/15 1341 .  isosorbide mononitrate (IMDUR) 24 hr tablet 60 mg, 60 mg, Oral, Daily, Belva Crome, MD, 60 mg at 08/09/15 0858 .  levalbuterol (XOPENEX) nebulizer solution 0.63 mg, 0.63 mg, Nebulization, TID, Velvet Bathe, MD, 0.63 mg at 08/09/15 1341 .  levalbuterol (XOPENEX) nebulizer solution 1.25 mg, 1.25 mg, Nebulization, Q6H PRN, Alesia Richards, MD, 1.25 mg at 08/05/15 2247 .  LORazepam (ATIVAN) tablet 1 mg, 1 mg, Oral, Q6H PRN, Velvet Bathe, MD, 1 mg at 08/09/15 1117 .  metoprolol (LOPRESSOR) injection 2.5 mg, 2.5 mg, Intravenous, Q6H PRN, Velvet Bathe, MD, 2.5 mg at 08/02/15 0406 .  ondansetron (ZOFRAN) tablet 4 mg, 4 mg, Oral, Q6H PRN **OR** ondansetron (ZOFRAN) injection 4 mg, 4 mg, Intravenous, Q6H PRN, Alesia Richards, MD, 4 mg at 08/06/15 1800 .  oxyCODONE  (OXYCONTIN) 12 hr tablet 30 mg, 30 mg, Oral, TID, Belkys A Regalado, MD, 30 mg at 08/09/15 7829 .  oxyCODONE-acetaminophen (PERCOCET) 7.5-325 MG per tablet 1-2 tablet, 1-2 tablet, Oral, Q6H PRN, Velvet Bathe, MD, 2 tablet at 08/09/15 1019 .  pantoprazole (PROTONIX) EC tablet 40 mg, 40 mg, Oral, Daily, Alesia Richards, MD, 40 mg at 08/09/15 0858 .  technetium TC 61M diethylenetriame-pentaacetic acid (DTPA) injection 31.1 milli Curie, 31.1 milli Curie, Intravenous, Once PRN, Elmarie Shiley, MD:   Allergies  Allergen Reactions  . Nsaids Other (See Comments)    ulcers  . Prednisone Other (See Comments)    High sugar levels  . Vancomycin Other (See Comments)    Caused Stevens-Johnsons syndrome  :   Family History  Problem Relation Age of Onset  . Coronary artery disease    . Hypertension    .  Cancer Mother   . Depression Mother   . Diabetes Mother   . Heart disease Maternal Grandfather   . Hyperlipidemia Maternal Grandfather   . Hypertension Maternal Grandfather   :   Social History   Social History  . Marital Status: Divorced    Spouse Name: N/A  . Number of Children: 7  . Years of Education: N/A   Occupational History  . UNEMPLOYED    Social History Main Topics  . Smoking status: Current Every Day Smoker -- 1.00 packs/day for 40 years    Types: Cigarettes  . Smokeless tobacco: Never Used  . Alcohol Use: No  . Drug Use: No  . Sexual Activity: Not on file   Other Topics Concern  . Not on file   Social History Narrative  :  REVIEW OF SYSTEMS:  A 15 point review of systems is documented in the electronic medical record. This was obtained by the nursing staff. However, I reviewed this with the patient to discuss relevant findings and make appropriate changes.  Pertinent items are noted in HPI.   PHYSICAL EXAM:  Blood pressure 118/72, pulse 99, temperature 98 F (36.7 C), temperature source Oral, resp. rate 14, height '5\' 11"'$  (1.803 m), weight 201 lb 15.1 oz (91.6  kg), SpO2 90 %. GENERAL:alert, Pale-appearing , mild respiratory distress anxious.  SKIN: skin color, texture, turgor are normal, no rashes or significant lesions EYES: normal, conjunctiva are pink and non-injected, sclera clear OROPHARYNX:no exudate, no erythema and lips, buccal mucosa, and tongue normal  NECK: supple, no JVD, thyroid normal size, non-tender, without nodularity LYMPH: no palpable lymphadenopathy in the cervical, axillary or inguinal LUNGS: Bilateral decreased breath sounds . HEART Irregular ABDOMEN: Obese, soft, no tenderness to palpation. Normoactive bowel sounds  PSYCH: alert & oriented x 3 with fluent speech NEURO: no focal motor/sensory deficits   KPS = 30  100 - Normal; no complaints; no evidence of disease. 90   - Able to carry on normal activity; minor signs or symptoms of disease. 80   - Normal activity with effort; some signs or symptoms of disease. 35   - Cares for self; unable to carry on normal activity or to do active work. 60   - Requires occasional assistance, but is able to care for most of his personal needs. 50   - Requires considerable assistance and frequent medical care. 92   - Disabled; requires special care and assistance. 17   - Severely disabled; hospital admission is indicated although death not imminent. 78   - Very sick; hospital admission necessary; active supportive treatment necessary. 10   - Moribund; fatal processes progressing rapidly. 0     - Dead  Karnofsky DA, Abelmann [place], Craver LS and Burchenal [facility] [phone]) The use of the nitrogen mustards in the palliative treatment of carcinoma: with particular reference to bronchogenic carcinoma Cancer 1 634-56  LABORATORY DATA:  Lab Results  Component Value Date   WBC 14.0* 08/08/2015   HGB 9.4* 08/08/2015   HCT 29.8* 08/08/2015   MCV 85.9 08/08/2015   PLT 202 08/08/2015   Lab Results  Component Value Date   NA 135 08/08/2015   K 4.5 08/08/2015   CL 91* 08/08/2015   CO2 29  08/08/2015   Lab Results  Component Value Date   ALT 21 07/28/2015   AST 37 07/28/2015   ALKPHOS 68 07/28/2015   BILITOT 0.7 07/28/2015     RADIOGRAPHY: Dg Chest 2 View  08/06/2015  CLINICAL DATA:  Shortness of breath today. Cough and hemoptysis. Lung mass. Initial encounter. EXAM: CHEST  2 VIEW COMPARISON:  CT chest 07/24/2015. Single view of the chest 08/03/2015 and 07/28/2015. FINDINGS: The lungs are emphysematous. Interstitial and airspace opacities which are worse on the left persists but aeration is improved since the most recent examination. Mediastinal and hilar lymphadenopathy is unchanged in appearance. There are small bilateral pleural effusions. The patient's lung mass is not well scratch the patient's lung mass is better seen on the prior CT. IMPRESSION: Improved aeration bilaterally.  No new abnormality. Mediastinal and hilar lymphadenopathy in patient with known left lower lobe pulmonary nodules. Small bilateral pleural effusions. Electronically Signed   By: Inge Rise M.D.   On: 08/06/2015 13:19   Dg Chest 2 View  07/23/2015  CLINICAL DATA:  Cough and dyspnea for several weeks. Hemoptysis tonight. EXAM: CHEST  2 VIEW COMPARISON:  06/16/2015 FINDINGS: There is mild hyperinflation. There is moderate vascular and interstitial prominence which has worsened. There is increased interstitial fluid or thickening in the basilar periphery. There is worsened alveolar opacity in the lingula. These findings may represent congestive heart failure with asymmetric alveolar edema. There is no pleural effusion. IMPRESSION: Worsened vascular and interstitial changes. Worsened lingular alveolar opacity. This may represent congestive failure but an infectious infiltrate is not entirely excluded. Electronically Signed   By: Andreas Newport M.D.   On: 07/23/2015 21:21   Ct Head Wo Contrast  08/07/2015  CLINICAL DATA:  Code stroke.  Left-sided weakness. EXAM: CT HEAD WITHOUT CONTRAST TECHNIQUE:  Contiguous axial images were obtained from the base of the skull through the vertex without intravenous contrast. COMPARISON:  06/16/2015 FINDINGS: Mild cerebral atrophy. No ventricular dilatation. No mass effect or midline shift. No abnormal extra-axial fluid collections. Gray-white matter junctions are distinct. Basal cisterns are not effaced. No evidence of acute intracranial hemorrhage. No depressed skull fractures. Visualized paranasal sinuses and mastoid air cells are not opacified. IMPRESSION: No acute intracranial abnormalities. These results were called by telephone at the time of interpretation on 08/07/2015 at 1:20 am to Dr. Sallyanne Havers , who verbally acknowledged these results. Electronically Signed   By: Lucienne Capers M.D.   On: 08/07/2015 01:21   Ct Angio Chest Pe W/cm &/or Wo Cm  07/24/2015  CLINICAL DATA:  Hemoptysis. Sudden onset of chest pain yesterday afternoon. Pain radiates to the back. EXAM: CT ANGIOGRAPHY CHEST WITH CONTRAST TECHNIQUE: Multidetector CT imaging of the chest was performed using the standard protocol during bolus administration of intravenous contrast. Multiplanar CT image reconstructions and MIPs were obtained to evaluate the vascular anatomy. CONTRAST:  100 mL Isovue 370 IV COMPARISON:  Chest radiographs 5 hours prior.  Chest CT 12/20/2010 FINDINGS: Mediastinum/Lymph Nodes: Extensive multifocal mediastinal adenopathy. Enlarged lymph nodes in the right upper paratracheal, highest mediastinum, right lower paratracheal, AP window, and subcarinal stations. Subcarinal soft tissue density is unable to be separated from the esophagus, however measures at least 5.6 cm transverse dimension. Extensive left hilar soft tissue density, contiguous with multi focal nodular soft tissue density in the left lower lobe. This soft tissue density leads to narrowing of the left lower lobe pulmonary arteries without definite invasion. No discrete filling defects within the pulmonary arteries. Some  degree of occlusion of the left lower lobe pulmonary vein. Thoracic aorta is normal in caliber. No pericardial effusion. There is a prominent right hilar lymph node. Heart is upper limits of normal in size, there are coronary artery calcifications. Lungs/Pleura: Multifocal innumerable left lower lobe  nodules, confluent in the infrahilar region, with a masslike confluence measuring 3.2 x 3.2 cm. More superiorly this confluence measures 4.1 cm 4.1 x 3.5 cm and includes a segmental branch of the left lower lobe pulmonary artery. Associated diffuse ground-glass and ill-defined opacity in the left lower lobe. Paramediastinal nodule in the lingula measures 1.5 cm. There is fissural thickening of the interlobar fissure. Diffuse septal thickening in the left lower lobe. Bubbly dependent debris in the left mainstem bronchus extending into the lower lobe with bronchial thickening and luminal narrowing. No definite nodule in the right lung. Moderate emphysema. Trace left pleural effusion. No right pleural effusion. Upper abdomen: Portions of both adrenal glands are normal, however not entirely excluded. No evidence of focal lesion in the liver allowing for phase of contrast. No acute abnormality in the included upper abdomen. Musculoskeletal: Right mid clavicle fracture with minimal surrounding callus formation. No blastic or destructive lytic lesions. Review of the MIP images confirms the above findings. IMPRESSION: 1. Extensive masslike nodular opacities in the left lower lobe, with a confluent infrahilar mass measuring 3.2 x 3.2 cm, many of which are contiguous with left hilar soft tissue density. There is extensive multifocal mediastinal adenopathy. 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 These masslike opacities encase the left lower lobe pulmonary artery leads luminal narrowing but no discrete invasion or frank pulmonary embolus. Multiple ground-glass opacities in the  left lower lobe, may be pneumonitis, postobstructive atelectasis or spread of malignancy. 2. Additional lingular nodule measures 1.5 cm. 3. Bubbly density in the left mainstem bronchus and left lower lobe, with left lung bronchial thickening. This is likely secretions or aspiration. 4. Moderate emphysema. Electronically Signed   By: Jeb Levering M.D.   On: 07/24/2015 02:17   Mr Virgel Paling Wo Contrast  08/08/2015  CLINICAL DATA:  65 year old male with left side weakness. Recently discovered left lung mass. Initial encounter. EXAM: MRI HEAD WITHOUT CONTRAST MRA HEAD WITHOUT CONTRAST TECHNIQUE: Multiplanar, multiecho pulse sequences of the brain and surrounding structures were obtained without intravenous contrast. Angiographic images of the head were obtained using MRA technique without contrast. COMPARISON:  Noncontrast head CT 08/07/2015. Cervical spine MRI 09/13/2007. FINDINGS: MRI HEAD FINDINGS Heterogeneous diffusion weighted imaging with no convincing diffusion restriction. Major intracranial vascular flow voids are preserved. Distal left vertebral artery appears dominant. No midline shift, mass effect, evidence of mass lesion, ventriculomegaly, extra-axial collection or acute intracranial hemorrhage. Cervicomedullary junction and pituitary are within normal limits. [place] Cables and white matter signal is within normal limits for age throughout the brain. No contrast administered Visible internal auditory structures appear normal. Mastoids are clear. Small volume retained secretions in the nasopharynx. Trace paranasal sinus mucosal thickening. Negative orbit and scalp soft tissues. Stable visualized cervical spine. There is a 10 mm bone lesion in the left clivus which appears to be new since 2009 and demonstrates restricted diffusion (series 4, image 9 and series 3, image 14). No other suspicious osseous lesion identified. MRA HEAD FINDINGS Study is intermittently degraded by motion artifact despite repeated  imaging attempts. Antegrade flow in the posterior circulation with dominant distal left vertebral artery, nondominant diminutive right vertebral artery. Normal left PICA origin. Normal vertebrobasilar junction. Normal dominant appearing right AICA origin. No basilar stenosis. Normal SCA and PCA origins. Normal PCA branches. Antegrade flow in both ICA siphons. No siphon stenosis. Ophthalmic artery origins are normal. Normal carotid termini, MCA and ACA origins. Anterior communicating artery with median artery of the corpus callosum  is noted. Visualized ACA branches are within normal limits. Bilateral MCA M1 segments, MCA bifurcations, and visualized bilateral MCA branches are within normal limits. IMPRESSION: 1. 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 2. No evidence of metastatic disease to the brain on this noncontrast exam, but early metastatic disease to the brain cannot be excluded in the absence of intravenous contrast. 3.  No acute abnormality of the brain is identified. 4.  Negative intracranial MRA. Electronically Signed   By: Genevie Ann M.D.   On: 08/08/2015 11:54   Mr Brain Wo Contrast  08/08/2015  CLINICAL DATA:  65 year old male with left side weakness. Recently discovered left lung mass. Initial encounter. EXAM: MRI HEAD WITHOUT CONTRAST MRA HEAD WITHOUT CONTRAST TECHNIQUE: Multiplanar, multiecho pulse sequences of the brain and surrounding structures were obtained without intravenous contrast. Angiographic images of the head were obtained using MRA technique without contrast. COMPARISON:  Noncontrast head CT 08/07/2015. Cervical spine MRI 09/13/2007. FINDINGS: MRI HEAD FINDINGS Heterogeneous diffusion weighted imaging with no convincing diffusion restriction. Major intracranial vascular flow voids are preserved. Distal left vertebral artery appears dominant. No midline shift, mass effect, evidence of mass lesion, ventriculomegaly, extra-axial collection or acute intracranial  hemorrhage. Cervicomedullary junction and pituitary are within normal limits. [place] Cables and white matter signal is within normal limits for age throughout the brain. No contrast administered Visible internal auditory structures appear normal. Mastoids are clear. Small volume retained secretions in the nasopharynx. Trace paranasal sinus mucosal thickening. Negative orbit and scalp soft tissues. Stable visualized cervical spine. There is a 10 mm bone lesion in the left clivus which appears to be new since 2009 and demonstrates restricted diffusion (series 4, image 9 and series 3, image 14). No other suspicious osseous lesion identified. MRA HEAD FINDINGS Study is intermittently degraded by motion artifact despite repeated imaging attempts. Antegrade flow in the posterior circulation with dominant distal left vertebral artery, nondominant diminutive right vertebral artery. Normal left PICA origin. Normal vertebrobasilar junction. Normal dominant appearing right AICA origin. No basilar stenosis. Normal SCA and PCA origins. Normal PCA branches. Antegrade flow in both ICA siphons. No siphon stenosis. Ophthalmic artery origins are normal. Normal carotid termini, MCA and ACA origins. Anterior communicating artery with median artery of the corpus callosum is noted. Visualized ACA branches are within normal limits. Bilateral MCA M1 segments, MCA bifurcations, and visualized bilateral MCA branches are within normal limits. IMPRESSION: 1. 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 2. No evidence of metastatic disease to the brain on this noncontrast exam, but early metastatic disease to the brain cannot be excluded in the absence of intravenous contrast. 3.  No acute abnormality of the brain is identified. 4.  Negative intracranial MRA. Electronically Signed   By: Genevie Ann M.D.   On: 08/08/2015 11:54   Nm Pulmonary Perf And Vent  07/29/2015  CLINICAL DATA:  Patient with postsurgical chest pain and hypoxia. Patient  could not elevate her arms. EXAM: NUCLEAR MEDICINE VENTILATION - PERFUSION LUNG SCAN TECHNIQUE: Ventilation images were obtained in multiple projections using inhaled aerosol Tc-42mDTPA. Perfusion images were obtained in multiple projections after intravenous injection of Tc-926mAA. RADIOPHARMACEUTICALS:  31.1 mCi Technetium-9984mPA aerosol inhalation and 4.2 mCi Technetium-42m89m IV COMPARISON:  Chest radiograph, 07/28/2015 FINDINGS: Ventilation: Decreased ventilation to left lung base corresponding to the area of airspace consolidation as well as overall decreased activity in the left lung when compared to the right. Perfusion: Normal profusion to the right  lung. Decreased profusion seen throughout the left lung with no discrete segmental defect. IMPRESSION: 1. Decreased profusion to the entire left lung. There is also decreased ventilation to the left lung and more discrete ventilatory defect the left lung base where there is consolidation on the current chest radiograph. This is an intermediate probability study for pulmonary thromboembolism based on modified prior PET criteria. Consider followup CTA of the chest if this patient can tolerate that procedure. Electronically Signed   By: Lajean Manes M.D.   On: 07/29/2015 12:08   Dg Chest Port 1 View  08/03/2015  CLINICAL DATA:  65 year old male with shortness of breath and wheezing EXAM: PORTABLE CHEST 1 VIEW COMPARISON:  Prior chest x-ray 07/28/2015; prior chest CT 07/24/2015 FINDINGS: Interval development of diffuse bilateral predominantly interstitial opacities. There is more dense opacifications in the left base obscuring the heart margin and diaphragm. Mediastinal fullness with bulky nodular opacities in the right paratracheal stripe are similar compared to prior. Left lower lobe pulmonary nodules or now obscured. No acute osseous abnormality. IMPRESSION: 1. Interval development of mild pulmonary edema concerning for volume overload versus CHF. 2.  Possible left pleural effusion. 3. Similar appearance of mediastinal and left hilar adenopathy. The known left lower lobe pulmonary nodules are now obscured from view by the edema and possible left-sided pleural effusion. Electronically Signed   By: Jacqulynn Cadet M.D.   On: 08/03/2015 11:35   Dg Chest Port 1 View  07/28/2015  CLINICAL DATA:  Chest pain and dyspnea, onset today.  Hypotension. EXAM: PORTABLE CHEST 1 VIEW COMPARISON:  07/23/2015 FINDINGS: There is worsened left lower lobe consolidation in this may represent postobstructive pneumonia. Hemorrhage, aspiration, neoplasm could also produce this appearance. There is hilar and mediastinal adenopathy. Right lung is clear. No large effusions. Normal pulmonary vasculature. Chronic right clavicle midshaft fracture deformity. IMPRESSION: Worsened left lower lobe consolidation. Electronically Signed   By: Andreas Newport M.D.   On: 07/28/2015 22:51      IMPRESSION: 65 yo man with a Left Lower Lung Mass and Massive Mediastinal Adenopathy pending Biopsy.  He has current hemoptysis.  He is at risk for SVC syndrome.  He has pulmonary vessel compression and post-obstructive changes in the left lower lung contributing to dyspnea.    The patient was seen by pulmonary at [facility] and transferred to [facility] for transbronchial biopsy on 07/26/15.  He was then discharged 07/27/15 and readmitted 07/28/15, however bronchoscopy with transbronchial biopsy has not been attempted at this time.  His clinical constellation of findings are very worrisome for primary lung cancer of the left lower lobe. Histologic tumor types in this setting could include small cell lung cancer, non-small cell lung cancer and other diagnoses.  This patient may benefit from palliative thoracic radiotherapy for hemostasis of hemoptysis and tumor shrinkage to improve pulmonary function/comfort.  Prior to proceeding with any therapeutic intervention, such as radiotherapy, a tissue diagnosis may help guide  treatment.  Given the multitude of acute medical issues, one consideration may be comfort care, which would not be inappropriate, depending on the patient's desires.  Radiation therapy could be used empirically for palliation if patient refuses bronchoscopy.  PLAN:Today, I talked to the patient and family about the findings and work-up thus far.  We discussed the natural history of locally advanced lung cancer, in the event that is what we are seeing, and general treatment, highlighting the role of radiotherapy in the management.  We discussed the available radiation techniques, and focused on the details of  logistics and delivery.  We reviewed the anticipated acute and late sequelae associated with radiation in this setting.  Patient does not want bronch at this time.  Pulmonary consult could be considered to discuss further.  In the interim, he is willing to transfer to [facility] for radiation therapy.  Please transfer to [facility], possibly step-down unit in anticipation of palliative radiation therapy.    ------------------------------------------------   Tyler Pita, MD [place] Director and Director of Stereotactic Radiosurgery Direct Dial: [phone]  Fax: [phone] [email or web address]  Skype  LinkedIn

## 2015-08-10 DIAGNOSIS — E782 Mixed hyperlipidemia: Secondary | ICD-10-CM

## 2015-08-10 DIAGNOSIS — G4733 Obstructive sleep apnea (adult) (pediatric): Secondary | ICD-10-CM

## 2015-08-10 DIAGNOSIS — Z515 Encounter for palliative care: Secondary | ICD-10-CM | POA: Insufficient documentation

## 2015-08-10 DIAGNOSIS — D638 Anemia in other chronic diseases classified elsewhere: Secondary | ICD-10-CM

## 2015-08-10 DIAGNOSIS — F4323 Adjustment disorder with mixed anxiety and depressed mood: Secondary | ICD-10-CM | POA: Insufficient documentation

## 2015-08-10 DIAGNOSIS — M549 Dorsalgia, unspecified: Secondary | ICD-10-CM

## 2015-08-10 DIAGNOSIS — G8929 Other chronic pain: Secondary | ICD-10-CM

## 2015-08-10 LAB — GLUCOSE, CAPILLARY
GLUCOSE-CAPILLARY: 147 mg/dL — AB (ref 65–99)
GLUCOSE-CAPILLARY: 210 mg/dL — AB (ref 65–99)
GLUCOSE-CAPILLARY: 201 mg/dL — AB (ref 65–99)
Glucose-Capillary: 162 mg/dL — ABNORMAL HIGH (ref 65–99)

## 2015-08-10 LAB — BASIC METABOLIC PANEL
Anion gap: 12 (ref 5–15)
BUN: 22 mg/dL — AB (ref 6–20)
CHLORIDE: 89 mmol/L — AB (ref 101–111)
CO2: 33 mmol/L — ABNORMAL HIGH (ref 22–32)
CREATININE: 1.47 mg/dL — AB (ref 0.61–1.24)
Calcium: 8.6 mg/dL — ABNORMAL LOW (ref 8.9–10.3)
GFR calc Af Amer: 57 mL/min — ABNORMAL LOW (ref >60)
GFR calc non Af Amer: 49 mL/min — ABNORMAL LOW (ref >60)
Glucose, Bld: 230 mg/dL — ABNORMAL HIGH (ref 65–99)
Potassium: 4.3 mmol/L (ref 3.5–5.1)
SODIUM: 134 mmol/L — AB (ref 135–145)

## 2015-08-10 LAB — CBC
HCT: 27.8 % — ABNORMAL LOW (ref 39.0–52.0)
HEMOGLOBIN: 9 g/dL — AB (ref 13.0–17.0)
MCH: 27.5 pg (ref 26.0–34.0)
MCHC: 32.4 g/dL (ref 30.0–36.0)
MCV: 85 fL (ref 78.0–100.0)
Platelets: 191 10*3/uL (ref 150–400)
RBC: 3.27 MIL/uL — ABNORMAL LOW (ref 4.22–5.81)
RDW: 14.5 % (ref 11.5–15.5)
WBC: 12.2 10*3/uL — ABNORMAL HIGH (ref 4.0–10.5)

## 2015-08-10 MED ORDER — HYDROMORPHONE HCL 1 MG/ML IJ SOLN
1.0000 mg | Freq: Once | INTRAMUSCULAR | Status: AC
  Administered 2015-08-10: 1 mg via INTRAVENOUS

## 2015-08-10 MED ORDER — HYDROMORPHONE HCL 2 MG/ML IJ SOLN
2.0000 mg | INTRAMUSCULAR | Status: DC | PRN
  Administered 2015-08-10 – 2015-08-11 (×7): 2 mg via INTRAVENOUS
  Filled 2015-08-10 (×7): qty 1

## 2015-08-10 MED ORDER — FUROSEMIDE 40 MG PO TABS: 40.0000 mg | ORAL_TABLET | Freq: Every day | ORAL | Status: DC

## 2015-08-10 MED ORDER — LORAZEPAM 2 MG/ML IJ SOLN
1.0000 mg | INTRAMUSCULAR | Status: DC | PRN
  Administered 2015-08-10 – 2015-08-11 (×5): 1 mg via INTRAVENOUS
  Filled 2015-08-10 (×5): qty 1

## 2015-08-10 NOTE — Progress Notes (Signed)
Per RN, pt did not wear his cpap well last night, frequently taking it off.  Cpap not placed on pt at this time due to RN's request.  Pt currently wearing 6lnc/50% vm.  RN will advise RT if cpap needed later tonight.

## 2015-08-10 NOTE — Progress Notes (Addendum)
PROGRESS NOTE  [name]  TDV:[id number] DOB: 1950-12-23  DOA: 07/28/2015 PCP: Fransisca Kaufmann Dettinger, MD at [facility].  Outpatient Specialists:  Pain management: At [facility]/? Dr. Jeneen Rinks.  Brief Narrative:  64 year old male with extensive PMH-type II DM, HTN, HLD, OSA, chronic back pain on opioids, recent clavicular fracture, anxiety, depression, tobacco abuse, recent hospitalization for/4/17-07/27/15 for left sided lung mass, atypical chest pain, postobstructive pneumonia, seen by pulmonology and planned outpatient follow-up for fiberoptic bronchoscopy, presented to [facility] with hemoptysis and chest pain. He was hypoxic in the 80s and hypotensive, in the ED. He has refused diagnostic workup of his lung mass including bronchoscopy or biopsy or chemotherapy treatment. Oncology consulted and patient was transferred to [facility] stepdown unit on 08/09/15 to start palliative radiation treatment. Hospital course at [facility] complicated by A. fib with RVR, strokelike symptoms, persistent acute hypoxic respiratory failure and mild hemoptysis.   Assessment & Plan:   Principal Problem:   Left Lower Lung Mass and Adenopathy- pt declined Bx - presumed Lung Cancer. Active Problems:   Essential hypertension, benign   Tobacco abuse   Mixed hyperlipidemia   Type 2 diabetes mellitus without complication, with long-term current use of insulin (HCC)   Obstructive sleep apnea   Hemoptysis   Chronic back pain on oxycontin 90 mg daily 30 mg tid   AKI (acute kidney injury) (HCC)   Atrial fibrillation with RVR (HCC)   Chest pain on breathing   Weakness of left arm   Acute respiratory failure with hypoxia (HCC)   Stroke-like symptoms   Anemia of chronic disease   Left lower lung mass and adenopathy - Patient has declined diagnostic workup including bronchoscopy or biopsy and has declined chemotherapy stating that he does not want to suffer. - As per medical oncology consultation 4/18:  Need CT abdomen and pelvis as well as MRI brain with and without contrast for staging, tissue diagnosis, although the radiation oncology consultation for palliative RT to lung/mediastinal mass control hemoptysis and bronchial obstruction, not a good candidate for concurrent chemoradiation, would probably offer chemotherapy if this were small cell lung cancer since symptoms might improve with chemotherapy despite poor functional status. - Patient apparently was contemplating CT abdomen and pelvis to look for metastasis to see if any of those lesions would be amenable to biopsy. - Radiation oncology consulted 4/21 and recommended transfer to [facility] for radiation therapy - Discussed with radiation oncologist on call on 4/22 who indicated that this may begin on 4/24.  Acute hypoxic respiratory failure - Multifactorial secondary to lung mass/cancer, postobstructive pneumonia (treated) and acute diastolic CHF (compensated). - Continue oxygen support and titrate to maintain saturations >88%.  Hemoptysis - Secondary to lung mass. - Supposed to start palliative radiation therapy which should help. Seems minimal and intermittent. Hemoglobin stable.  Left-sided chest pain - May be related to lung mass. - Palliative care team assisting with pain management.  Left-sided weakness - Acute symptoms began on 4/18. DD-metastatic disease versus acute lacunar stroke affecting right basal ganglia on. Neurology signed off 4/19. - MRI brain without contrast showed 1 cm bony lesion in the left clivus suggestive of bony metastasis. Since this was done without contrast, could not rule out metastatic disease. - Highly suspicious for brain metastasis.   A. Fib - Rate controlled on amiodarone. Not candidate for anticoagulation secondary to hemoptysis. - Cardiology have seen: As per recommendations on 4/19, amiodarone 200 MG twice a day 2 weeks (starting 4/19) and  then decrease to 200 MG daily-may be a good  long-term treatment to prevent recurrent A. fib given disease process and overall poor prognosis.  Postobstructive pneumonia/HCAP - Completed antibiotics  Acute kidney injury - Creatinine on admission was 2.48. This had normalized but has gradually increased to 1.46-likely secondary to IV diuresis. Change Lasix to by mouth. Follow BMP in a.m.  Recent clavicular fracture/chronic pain - Continue pain regimen  Anemia of chronic disease/iron deficiency - Hemoglobin stable.  Hyperlipidemia - Continue Lipitor.  Anxiety & depression - Continue Wellbutrin and when necessary Ativan.  Tobacco abuse - Cessation counseled. Continue nicotine patch.  Acute diastolic CHF - Seems euvolemic and creatinine starting to rise. Change IV Lasix to oral Lasix 40 mg daily.  OSA - Nightly CPAP  GERD - Continue PPI.    DVT prophylaxis: SCDs  Code Status: DO NOT RESUSCITATE Family Communication: Discussed with patient. None at bedside.  Disposition Plan: Initially admitted to [facility]. Transferred to [facility] stepdown unit on 4/21. DC home when medically stable.   Consultants:   Pulmonary and critical care  Cardiology  Palliative care medicine  Medical oncology  Neurology  Radiation oncology  Procedures:   2-D echo 08/06/15: Study Conclusions  - Left ventricle: The cavity size was normal. Wall thickness was  normal. Systolic function was normal. The estimated ejection  fraction was in the range of 60% to 65%. Wall motion was normal;  there were no regional wall motion abnormalities. Doppler  parameters are consistent with abnormal left ventricular  relaxation (grade 1 diastolic dysfunction). - Left atrium: The atrium was normal in size.   Carotid Dopplers 08/08/15: Summary: The right internal carotid artery exhibits 1-39% stenosis.  The left internal carotid artery exhibits elevated peak systolic velocities suggestive of 80-99% stenosis, elevated end  diastolic velocities suggestive of low range 60-79% stenosis, and ICA/CCA ratio suggestive of 60-79% stenosis, however there is no evidence of significant plaque morphology to support high grade stenosis.  The left external carotid artery exhibits elevated waveforms suggestive of >50% stenosis.   Antimicrobials:   Levofloxacin: 4/14 > 4/19  Zosyn: 4/9 > 4/14   Linezolid 4/10 > 4/13   Subjective: Mild intermittent coughing up of blood (seen tinge of hemoptysis in white sputum in trait), dyspnea on exertion. As per RN, left-sided weakness, unsteady gait and slightly anxious.  Objective:  Filed Vitals:   08/10/15 0700 08/10/15 0800 08/10/15 0841 08/10/15 0845  BP:      Pulse: 89     Temp:  98.3 F (36.8 C)    TempSrc:  Oral    Resp: 14     Height:      Weight:      SpO2: 92%  95% 95%  BP: 124/91.  Intake/Output Summary (Last 24 hours) at 08/10/15 1030 Last data filed at 08/10/15 1000  Gross per 24 hour  Intake    400 ml  Output   1800 ml  Net  -1400 ml   Filed Weights   08/08/15 0428 08/09/15 0400 08/10/15 0500  Weight: 90.901 kg (200 lb 6.4 oz) 91.6 kg (201 lb 15.1 oz) 91.1 kg (200 lb 13.4 oz)    Examination:  General exam: Pleasant middle-aged male, moderately built and nourished, lying comfortably propped up in bed and in no obvious distress. Respiratory system: Reduced breath sounds bilaterally, left >right with scattered occasional expiratory rhonchi and bibasal crackles. Respiratory effort normal. Cardiovascular system: S1 & S2 heard, RRR.Marland Kitchen No JVD, murmurs, rubs, gallops or clicks. No pedal  edema. Telemetry: Sinus rhythm. Gastrointestinal system: Abdomen is nondistended, soft and nontender. No organomegaly or masses felt. Normal bowel sounds heard. Central nervous system: Alert and oriented. No focal neurological deficits. Extremities: 5 x 5 power in right extremities, 4 x 5 power in left upper extremity with pronator drift and 4+/5 power in left lower  extremity.. Skin: No rashes, lesions or ulcers Psychiatry: Judgement and insight appear normal. Mood & affect appropriate.     Data Reviewed: I have personally reviewed following labs and imaging studies  CBC:  Recent Labs Lab 08/07/15 0025 08/08/15 0236  WBC 13.4* 14.0*  HGB 9.4* 9.4*  HCT 28.9* 29.8*  MCV 85.8 85.9  PLT 171 947   Basic Metabolic Panel:  Recent Labs Lab 08/04/15 0957 08/04/15 2338 08/05/15 1758 08/07/15 0025 08/08/15 0236  NA  --  135 136 135 135  K 3.1* 3.5 3.2* 4.4 4.5  CL  --  93* 93* 96* 91*  CO2  --  '28 28 27 29  '$ GLUCOSE  --  136* 148* 143* 169*  BUN  --  '15 14 13 15  '$ CREATININE  --  1.19 1.22 1.26* 1.46*  CALCIUM  --  8.0* 8.0* 8.4* 8.5*   GFR: Estimated Creatinine Clearance: 60.5 mL/min (by C-G formula based on Cr of 1.46). Liver Function Tests: No results for input(s): AST, ALT, ALKPHOS, BILITOT, PROT, ALBUMIN in the last 168 hours. No results for input(s): LIPASE, AMYLASE in the last 168 hours. No results for input(s): AMMONIA in the last 168 hours. Coagulation Profile: No results for input(s): INR, PROTIME in the last 168 hours. Cardiac Enzymes:  Recent Labs Lab 08/05/15 1040 08/05/15 1758 08/05/15 2159  TROPONINI <0.03 <0.03 <0.03   BNP (last 3 results) No results for input(s): PROBNP in the last 8760 hours. HbA1C: No results for input(s): HGBA1C in the last 72 hours. CBG:  Recent Labs Lab 08/09/15 0808 08/09/15 1204 08/09/15 1813 08/09/15 2258 08/10/15 0727  GLUCAP 156* 222* 209* 161* 147*   Lipid Profile: No results for input(s): CHOL, HDL, LDLCALC, TRIG, CHOLHDL, LDLDIRECT in the last 72 hours. Thyroid Function Tests: No results for input(s): TSH, T4TOTAL, FREET4, T3FREE, THYROIDAB in the last 72 hours. Anemia Panel: No results for input(s): VITAMINB12, FOLATE, FERRITIN, TIBC, IRON, RETICCTPCT in the last 72 hours.   Recent Results (from the past 240 hour(s))  MRSA PCR Screening     Status: None    Collection Time: 08/09/15  5:33 PM  Result Value Ref Range Status   MRSA by PCR NEGATIVE NEGATIVE Final    Comment:        The GeneXpert MRSA Assay (FDA approved for NASAL specimens only), is one component of a comprehensive MRSA colonization surveillance program. It is not intended to diagnose MRSA infection nor to guide or monitor treatment for MRSA infections.          Radiology Studies: Mr Virgel Paling [facility] Contrast  08/08/2015  CLINICAL DATA:  65 year old male with left side weakness. Recently discovered left lung mass. Initial encounter. EXAM: MRI HEAD WITHOUT CONTRAST MRA HEAD WITHOUT CONTRAST TECHNIQUE: Multiplanar, multiecho pulse sequences of the brain and surrounding structures were obtained without intravenous contrast. Angiographic images of the head were obtained using MRA technique without contrast. COMPARISON:  Noncontrast head CT 08/07/2015. Cervical spine MRI 09/13/2007. FINDINGS: MRI HEAD FINDINGS Heterogeneous diffusion weighted imaging with no convincing diffusion restriction. Major intracranial vascular flow voids are preserved. Distal left vertebral artery appears dominant. No midline shift, mass effect, evidence of  mass lesion, ventriculomegaly, extra-axial collection or acute intracranial hemorrhage. Cervicomedullary junction and pituitary are within normal limits. Pearline Cables and white matter signal is within normal limits for age throughout the brain. No contrast administered Visible internal auditory structures appear normal. Mastoids are clear. Small volume retained secretions in the nasopharynx. Trace paranasal sinus mucosal thickening. Negative orbit and scalp soft tissues. Stable visualized cervical spine. There is a 10 mm bone lesion in the left clivus which appears to be new since 2009 and demonstrates restricted diffusion (series 4, image 9 and series 3, image 14). No other suspicious osseous lesion identified. MRA HEAD FINDINGS Study is intermittently degraded by motion  artifact despite repeated imaging attempts. Antegrade flow in the posterior circulation with dominant distal left vertebral artery, nondominant diminutive right vertebral artery. Normal left PICA origin. Normal vertebrobasilar junction. Normal dominant appearing right AICA origin. No basilar stenosis. Normal SCA and PCA origins. Normal PCA branches. Antegrade flow in both ICA siphons. No siphon stenosis. Ophthalmic artery origins are normal. Normal carotid termini, MCA and ACA origins. Anterior communicating artery with median artery of the corpus callosum is noted. Visualized ACA branches are within normal limits. Bilateral MCA M1 segments, MCA bifurcations, and visualized bilateral MCA branches are within normal limits. IMPRESSION: 1. 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 2. No evidence of metastatic disease to the brain on this noncontrast exam, but early metastatic disease to the brain cannot be excluded in the absence of intravenous contrast. 3.  No acute abnormality of the brain is identified. 4.  Negative intracranial MRA. Electronically Signed   By: Genevie Ann M.D.   On: 08/08/2015 11:54   Mr Brain Wo Contrast  08/08/2015  CLINICAL DATA:  65 year old male with left side weakness. Recently discovered left lung mass. Initial encounter. EXAM: MRI HEAD WITHOUT CONTRAST MRA HEAD WITHOUT CONTRAST TECHNIQUE: Multiplanar, multiecho pulse sequences of the brain and surrounding structures were obtained without intravenous contrast. Angiographic images of the head were obtained using MRA technique without contrast. COMPARISON:  Noncontrast head CT 08/07/2015. Cervical spine MRI 09/13/2007. FINDINGS: MRI HEAD FINDINGS Heterogeneous diffusion weighted imaging with no convincing diffusion restriction. Major intracranial vascular flow voids are preserved. Distal left vertebral artery appears dominant. No midline shift, mass effect, evidence of mass lesion, ventriculomegaly, extra-axial collection or  acute intracranial hemorrhage. Cervicomedullary junction and pituitary are within normal limits. Pearline Cables and white matter signal is within normal limits for age throughout the brain. No contrast administered Visible internal auditory structures appear normal. Mastoids are clear. Small volume retained secretions in the nasopharynx. Trace paranasal sinus mucosal thickening. Negative orbit and scalp soft tissues. Stable visualized cervical spine. There is a 10 mm bone lesion in the left clivus which appears to be new since 2009 and demonstrates restricted diffusion (series 4, image 9 and series 3, image 14). No other suspicious osseous lesion identified. MRA HEAD FINDINGS Study is intermittently degraded by motion artifact despite repeated imaging attempts. Antegrade flow in the posterior circulation with dominant distal left vertebral artery, nondominant diminutive right vertebral artery. Normal left PICA origin. Normal vertebrobasilar junction. Normal dominant appearing right AICA origin. No basilar stenosis. Normal SCA and PCA origins. Normal PCA branches. Antegrade flow in both ICA siphons. No siphon stenosis. Ophthalmic artery origins are normal. Normal carotid termini, MCA and ACA origins. Anterior communicating artery with median artery of the corpus callosum is noted. Visualized ACA branches are within normal limits. Bilateral MCA M1 segments, MCA bifurcations, and visualized bilateral MCA branches are within  normal limits. IMPRESSION: 1. 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 2. No evidence of metastatic disease to the brain on this noncontrast exam, but early metastatic disease to the brain cannot be excluded in the absence of intravenous contrast. 3.  No acute abnormality of the brain is identified. 4.  Negative intracranial MRA. Electronically Signed   By: Genevie Ann M.D.   On: 08/08/2015 11:54        Scheduled Meds: . amiodarone  400 mg Oral BID  . antiseptic oral rinse  7 mL Mouth  Rinse BID  . atorvastatin  20 mg Oral Daily  . buPROPion  150 mg Oral Daily  . fluticasone furoate-vilanterol  1 puff Inhalation Daily  . furosemide  40 mg Intravenous Q12H  . gabapentin  600 mg Oral BID  . insulin aspart  0-20 Units Subcutaneous TID WC  . insulin aspart  0-5 Units Subcutaneous QHS  . ipratropium  0.5 mg Nebulization TID  . isosorbide mononitrate  60 mg Oral Daily  . levalbuterol  0.63 mg Nebulization TID  . oxyCODONE  30 mg Oral TID  . pantoprazole  40 mg Oral Daily   Continuous Infusions:    LOS: 12 days    Time spent: 35 minutes.    [facility], MD [facility] Pager [phone]  If 7PM-7AM, please contact night-coverage [email or web address] Password [email or web address] 08/10/2015, 10:30 AM

## 2015-08-10 NOTE — Consult Note (Signed)
Consultation Note Date: 08/10/2015   Patient Name: [name]  DOB: Dec 26, 1950  MRN: [id number]  Age / Sex: 65 y.o., male  PCP: Worthy Rancher, MD Referring Physician: Modena Jansky, MD  Reason for Consultation: pain management, non pain management of dyspnea, anxiety and also assistance with Establishing goals of care  Life limiting illness: Recent diagnosis of lung masses, possible brain mass  Clinical Assessment/Narrative:  65 year old gentleman with a past medical history significant for diabetes, hypertension, dyslipidemia, sleep apnea. Patient apparently is connected with the pain clinic because of chronic back pain he is on chronic opioids. History of anxiety, depression, tobacco use, recent clavicular fracture. Recent hospitalization in early part of April 2017 for left-sided lung mass, atypical chest discomfort, postobstructive pneumonia. The patient was referred to go to pulmonology for bronchoscopy and tissue diagnosis.   Patient has been admitted to the hospital this time since 07-28-15 for chest pain, hemoptysis. He was hypoxic in the 80s and also hypotensive. It is reported that the patient refused bronchoscopy, diagnostic workup of his lung mass. Oncology was consulted, oncology saw the patient on 4-18: Recommendations were to obtain CT scan of the abdomen and pelvis, MRI of the brain with and without contrast for staging purposes. Tissue diagnosis was again recommended. It was noted that the patient is likely not a good candidate for concurrent chemoradiation. Oncology noted that they would probably offer chemotherapy if this was diagnosed to be small cell lung cancer since symptoms improve with chemotherapy despite poor functional status. Patient is now transferred to [facility]. Patient was seen by radiation oncology. It is deemed appropriate to proceed with radiation treatments at least to  lower his symptom burden. MRI of the brain without contrast showed 1 cm bony lesion in the left clivus suggestive of bony metastases.  The patient remains on a variety of opioid regimen such as IV fentanyl, IV Dilaudid, scheduled OxyContin, Percocet. He is also on Ativan. He has extensive symptom burden due to uncontrolled pain, uncontrolled dyspnea, uncontrolled anxiety.   Hence, palliative consultation has been obtained.  Patient is awake alert sitting in bed in stepdown unit at [facility]. His family is currently not at the bedside. He is acutely dyspneic, hypoxic 84% on room air. He is awake and alert and answers all questions appropriately. He pulls away his nonrebreather mask but is able to put it back on with gentle encouragement. He complains of generalized discomfort, chest discomfort. He complains of being acutely dyspneic. He is having active hemoptysis. He appears anxious. Patient states that he has been reiterating over and over again that his goals are to avoid suffering. It is reported that he has told nursing staff at stepdown unit here at [facility] long that he knows he is not going to get out a few alive.  Introduced myself and discussed scope of palliative medicine as specialized medical care for people living with serious illness. Our focus is on providing relief from the symptoms and stress of a serious illness. The goal is to improve quality of life for both the patient and the family.  See recommendations below. Palliative will continue to follow along. Thank you for the consult.    Contacts/Participants in Discussion: Primary Decision Maker:     Relationship to Patient   HCPOA: yes     SUMMARY OF RECOMMENDATIONS: Do Not resuscitate DC IV fentanyl.  Change IV Dilaudid to 2 mg IV every 2 hours when necessary. Have low threshold for starting continuous Dilaudid  infusion if the patient has escalating IV Dilaudid needs for the purposes of establishing aggressive  symptom management. Change OxyContin to 30 mg 3 times a day now   DC oral Percocet Change Ativan from by mouth to IV Agree with radiation therapy planned for 4-24 Add bowel and antiemetic regimen  Code Status/Advance Care Planning: DNR    Code Status Orders        Start     Ordered   07/29/15 0106  Do not attempt resuscitation (DNR)   Continuous    Question Answer Comment  In the event of cardiac or respiratory ARREST Do not call a "code blue"   In the event of cardiac or respiratory ARREST Do not perform Intubation, CPR, defibrillation or ACLS   In the event of cardiac or respiratory ARREST Use medication by any route, position, wound care, and other measures to relive pain and suffering. May use oxygen, suction and manual treatment of airway obstruction as needed for comfort.      07/29/15 0109    Code Status History    Date Active Date Inactive Code Status Order ID Comments User Context   07/29/2015  1:09 AM  DNR [id number]  Alesia Richards, MD ED   07/24/2015  2:38 AM 07/27/2015  4:24 PM Full Code [id number]  Orvan Falconer, MD Inpatient   02/14/2014  8:08 PM 02/15/2014 12:53 AM Full Code [id number]  Shanda Howells, MD ED      Other Directives:None  Symptom Management:    as above   Palliative Prophylaxis:   Delirium Protocol Psycho-social/Spiritual:  Support System: [place] Desire for further Chaplaincy support:no Additional Recommendations: Caregiving  Support/Resources  Prognosis: guarded, monitor disease trajectory here in the hospital, assess for tolerance of radiation treatments   Discharge Planning: pending hospital course.   Chief Complaint/ Primary Diagnoses: Present on Admission:  . AKI (acute kidney injury) ([place]) . Hemoptysis . Acute respiratory failure with hypoxia ([place]) . Left Lower Lung Mass and Adenopathy- pt declined Bx - presumed Lung Cancer. . Chronic back pain on oxycontin 90 mg daily 30 mg tid . Tobacco abuse . Obstructive sleep apnea . Mixed  hyperlipidemia . Essential hypertension, benign . Anemia of chronic disease  I have reviewed the medical record, interviewed the patient and family, and examined the patient. The following aspects are pertinent.  Past Medical History  Diagnosis Date  . Essential hypertension, benign   . Type 2 diabetes mellitus ([place])   . Mixed hyperlipidemia   . OSA (obstructive sleep apnea)   . Partial small bowel obstruction ([place])   . 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 Coronary atherosclerosis of native coronary artery     Nonobstructive  . Erythrocytosis   . Peripheral arterial disease (H[facility])     70% right iliac  . History of MRSA infection   . Neuropathy ([place]) 2013  . Cancer ([place]) 2015    skin  . Cancer of lung ([place]) 07-24-15   Social History   Social History  . Marital Status: Divorced    Spouse Name: N/A  . Number of Children: 7  . Years of Education: N/A   Occupational History  . UNEMPLOYED    Social History Main Topics  . Smoking status: Current Every Day Smoker -- 1.00 packs/day for 40 years    Types: Cigarettes  . Smokeless tobacco: Never Used  . Alcohol Use: No  . Drug Use: No  . Sexual Activity: Not Asked   Other Topics Concern  .  None   Social History Narrative   Family History  Problem Relation Age of Onset  . Coronary artery disease    . Hypertension    . Cancer Mother   . Depression Mother   . Diabetes Mother   . Heart disease Maternal Grandfather   . Hyperlipidemia Maternal Grandfather   . Hypertension Maternal Grandfather    Scheduled Meds: . amiodarone  400 mg Oral BID  . atorvastatin  20 mg Oral Daily  . buPROPion  150 mg Oral Daily  . fluticasone furoate-vilanterol  1 puff Inhalation Daily  . [START ON 08/11/2015] furosemide  40 mg Oral Daily  . gabapentin  600 mg Oral BID  . insulin aspart  0-20 Units Subcutaneous TID WC  . insulin aspart  0-5 Units Subcutaneous QHS  . ipratropium  0.5 mg Nebulization TID  . isosorbide mononitrate   60 mg Oral Daily  . levalbuterol  0.63 mg Nebulization TID  . oxyCODONE  30 mg Oral TID  . pantoprazole  40 mg Oral Daily   Continuous Infusions:  PRN Meds:.fluticasone, guaiFENesin-dextromethorphan, HYDROmorphone (DILAUDID) injection, levalbuterol, LORazepam, ondansetron **OR** ondansetron (ZOFRAN) IV, technetium TC 7M diethylenetriame-pentaacetic acid Medications Prior to Admission:  Prior to Admission medications   Medication Sig Start Date End Date Taking? Authorizing Provider  albuterol (PROVENTIL HFA;VENTOLIN HFA) 108 (90 Base) MCG/ACT inhaler Inhale 2 puffs into the lungs every 4 (four) hours as needed for wheezing or shortness of breath. 07/18/15  Yes Fransisca Kaufmann Dettinger, MD  amLODipine (NORVASC) 10 MG tablet Take 10 mg by mouth daily.   Yes Historical Provider, MD  amoxicillin-clavulanate (AUGMENTIN) 875-125 MG tablet Take 1 tablet by mouth 2 (two) times daily. 07/27/15  Yes Thurnell Lose, MD  atenolol (TENORMIN) 100 MG tablet Take 1 tablet (100 mg total) by mouth daily. 04/04/15  Yes Fransisca Kaufmann Dettinger, MD  atorvastatin (LIPITOR) 20 MG tablet Take 1 tablet (20 mg total) by mouth daily. 04/04/15  Yes Fransisca Kaufmann Dettinger, MD  buPROPion (WELLBUTRIN XL) 150 MG 24 hr tablet TAKE 1 TABLET BY MOUTH DAILY 07/23/15  Yes Fransisca Kaufmann Dettinger, MD  fluticasone furoate-vilanterol (BREO ELLIPTA) 100-25 MCG/INH AEPB Inhale 1 puff into the lungs daily. 07/18/15  Yes Fransisca Kaufmann Dettinger, MD  gabapentin (NEURONTIN) 600 MG tablet TAKE TWO (2) TABLETS THREE (3) TIMES DAILY 04/29/15  Yes Fransisca Kaufmann Dettinger, MD  gemfibrozil (LOPID) 600 MG tablet Take 600 mg by mouth 2 (two) times daily before a meal.    Yes Historical Provider, MD  Insulin Degludec (TRESIBA FLEXTOUCH) 200 UNIT/ML SOPN Inject 95 Units into the skin daily. 05/08/15  Yes Fransisca Kaufmann Dettinger, MD  JANUVIA 100 MG tablet TAKE 1 TABLET BY MOUTH DAILY 07/23/15  Yes Fransisca Kaufmann Dettinger, MD  lisinopril-hydrochlorothiazide (PRINZIDE,ZESTORETIC) 20-12.5 MG tablet  TAKE 2 TABLETS BY MOUTH DAILY. Patient taking differently: TAKE 1 TABLETS BY MOUTH 2 TIMES DAILY. 07/23/15  Yes Fransisca Kaufmann Dettinger, MD  metFORMIN (GLUCOPHAGE) 1000 MG tablet Take 1,000 mg by mouth 2 (two) times daily with a meal.  11/14/13  Yes Historical Provider, MD  NOVOLOG FLEXPEN 100 UNIT/ML FlexPen INJECT UP TO 40 UNITS UP TO FOUR TIMES A DAY 06/24/15  Yes Fransisca Kaufmann Dettinger, MD  omeprazole (PRILOSEC) 40 MG capsule Take 40 mg by mouth daily.    Yes Historical Provider, MD  oxyCODONE (OXYCONTIN) 30 MG 12 hr tablet Take 30 mg by mouth 3 (three) times daily.  01/07/15  Yes Historical Provider, MD  oxyCODONE-acetaminophen (PERCOCET/ROXICET) 5-325 MG tablet Take  1 tablet by mouth every 6 (six) hours as needed. Patient taking differently: Take 1 tablet by mouth every 6 (six) hours as needed for moderate pain.  06/16/15  Yes Lily Kocher, PA-C  tiZANidine (ZANAFLEX) 4 MG tablet Take 4 mg by mouth daily.    Yes Historical Provider, MD  umeclidinium bromide (INCRUSE ELLIPTA) 62.5 MCG/INH AEPB Inhale 1 puff into the lungs daily. 07/18/15  Yes Fransisca Kaufmann Dettinger, MD  fluticasone (FLONASE) 50 MCG/ACT nasal spray Place 1 spray into both nostrils 2 (two) times daily as needed for allergies or rhinitis. 05/15/15   Fransisca Kaufmann Dettinger, MD  Insulin Pen Needle (PEN NEEDLES) 31G X 6 MM MISC 1 each by Does not apply route 4 (four) times daily. Use with insulin pens to inject insulin up to 5 times daily 03/20/15   Tammy Eckard, PHARMD  predniSONE (DELTASONE) 20 MG tablet 2 po at same time daily for 5 days Patient taking differently: Take 40 mg by mouth daily. 2 po at same time daily for 5 days 07/18/15   Worthy Rancher, MD   Allergies  Allergen Reactions  . Nsaids Other (See Comments)    ulcers  . Prednisone Other (See Comments)    High sugar levels  . Vancomycin Other (See Comments)    Caused Stevens-Johnsons syndrome    Review of Systems + for pain, dyspnea anxiety  Physical Exam Age-appropriate appearing  young gentleman resting in bed Patient in acute distress-due to shortness of breath, anxiety, generalized pain S1-S2 Coarse rhonchorous breath sounds anteriorly Abdomen soft nontender Extremities no edema Awake alert oriented in distress  Vital Signs: BP 127/61 mmHg  Pulse 90  Temp(Src) 98.3 F (36.8 C) (Oral)  Resp 15  Ht '5\' 11"'$  (1.803 m)  Wt 91.1 kg (200 lb 13.4 oz)  BMI 28.02 kg/m2  SpO2 95%  SpO2: SpO2: 95 % O2 Device:SpO2: 95 % O2 Flow Rate: .O2 Flow Rate (L/min): 14 L/min  IO: Intake/output summary:  Intake/Output Summary (Last 24 hours) at 08/10/15 1605 Last data filed at 08/10/15 1300  Gross per 24 hour  Intake    400 ml  Output   2600 ml  Net  -2200 ml    LBM: Last BM Date: 08/09/15 Baseline Weight: Weight: 91.4 kg (201 lb 8 oz) Most recent weight: Weight: 91.1 kg (200 lb 13.4 oz)      Palliative Assessment/Data:  Flowsheet Rows        Most Recent Value   Intake Tab    Referral Department  Hospitalist   Unit at Time of Referral  ICU   Palliative Care Primary Diagnosis  Cancer   Palliative Care Type  New Palliative care   Reason for referral  Non-pain Symptom, Clarify Goals of Care   Date first seen by Palliative Care  08/10/15   Clinical Assessment    Palliative Performance Scale Score  30%   Pain Max last 24 hours  7   Pain Min Last 24 hours  6   Dyspnea Max Last 24 Hours  8   Dyspnea Min Last 24 hours  7   Psychosocial & Spiritual Assessment    Palliative Care Outcomes    Patient/Family meeting held?  Yes   Who was at the meeting?  patient    Palliative Care follow-up planned  Yes, Facility      Additional Data Reviewed:  CBC:    Component Value Date/Time   WBC 12.2* 08/10/2015 1051   HGB 9.0* 08/10/2015 1051   HCT  27.8* 08/10/2015 1051   PLT 191 08/10/2015 1051   MCV 85.0 08/10/2015 1051   NEUTROABS 12.4* 07/31/2015 0242   LYMPHSABS 0.7 07/31/2015 0242   MONOABS 1.3* 07/31/2015 0242   EOSABS 0.0 07/31/2015 0242   BASOSABS 0.0  07/31/2015 0242   Comprehensive Metabolic Panel:    Component Value Date/Time   NA 134* 08/10/2015 1051   K 4.3 08/10/2015 1051   CL 89* 08/10/2015 1051   CO2 33* 08/10/2015 1051   BUN 22* 08/10/2015 1051   CREATININE 1.47* 08/10/2015 1051   GLUCOSE 230* 08/10/2015 1051   CALCIUM 8.6* 08/10/2015 1051   AST 37 07/28/2015 2141   ALT 21 07/28/2015 2141   ALKPHOS 68 07/28/2015 2141   BILITOT 0.7 07/28/2015 2141   PROT 7.0 07/28/2015 2141   ALBUMIN 3.3* 07/28/2015 2141     Time In: 1500 Time Out: 1620 Time Total: 80 Greater than 50%  of this time was spent counseling and coordinating care related to the above assessment and plan.  Signed by: Loistine Chance, MD [id number] Loistine Chance, MD  08/10/2015, 4:05 PM  Please contact Palliative Medicine Team phone at [phone] for questions and concerns.

## 2015-08-10 NOTE — Progress Notes (Signed)
  Radiation Oncology         [phone] ________________________________  Name: [name] MRN: [id number]  Date: 07/28/2015  DOB: Sep 01, 1950  Chart Note:  Transfer to [name] noted and appreciated.  Patient to undergo CT sim on Monday 4/24 to proceed with palliative radiotherapy.  ------------------------------------------------   Tyler Pita, MD [place] Director and Director of Stereotactic Radiosurgery Direct Dial: [phone]  Fax: [phone] [email or web address]  Skype  LinkedIn

## 2015-08-11 ENCOUNTER — Inpatient Hospital Stay (HOSPITAL_COMMUNITY): Payer: Medicare Other

## 2015-08-11 DIAGNOSIS — Z7189 Other specified counseling: Secondary | ICD-10-CM

## 2015-08-11 DIAGNOSIS — Z794 Long term (current) use of insulin: Secondary | ICD-10-CM

## 2015-08-11 LAB — GLUCOSE, CAPILLARY
GLUCOSE-CAPILLARY: 163 mg/dL — AB (ref 65–99)
Glucose-Capillary: 189 mg/dL — ABNORMAL HIGH (ref 65–99)
Glucose-Capillary: 190 mg/dL — ABNORMAL HIGH (ref 65–99)
Glucose-Capillary: 195 mg/dL — ABNORMAL HIGH (ref 65–99)

## 2015-08-11 MED ORDER — LORAZEPAM 2 MG/ML IJ SOLN
1.0000 mg | INTRAMUSCULAR | Status: DC | PRN
  Administered 2015-08-11 – 2015-08-13 (×13): 1 mg via INTRAVENOUS
  Filled 2015-08-11 (×13): qty 1

## 2015-08-11 MED ORDER — HYDROMORPHONE BOLUS VIA INFUSION
1.0000 mg | INTRAVENOUS | Status: DC | PRN
  Administered 2015-08-12 – 2015-08-13 (×8): 1 mg via INTRAVENOUS
  Filled 2015-08-11 (×9): qty 1

## 2015-08-11 MED ORDER — HYDROMORPHONE HCL 2 MG/ML IJ SOLN
2.0000 mg | Freq: Once | INTRAMUSCULAR | Status: AC
  Administered 2015-08-11: 2 mg via INTRAVENOUS

## 2015-08-11 MED ORDER — SODIUM CHLORIDE 0.9 % IV SOLN
2.0000 mg/h | INTRAVENOUS | Status: DC
  Administered 2015-08-11 – 2015-08-13 (×4): 2 mg/h via INTRAVENOUS
  Filled 2015-08-11 (×5): qty 2.5

## 2015-08-11 MED ORDER — FUROSEMIDE 40 MG PO TABS
40.0000 mg | ORAL_TABLET | Freq: Every day | ORAL | Status: DC
Start: 2015-08-12 — End: 2015-08-11

## 2015-08-11 MED ORDER — FUROSEMIDE 10 MG/ML IJ SOLN
80.0000 mg | Freq: Once | INTRAMUSCULAR | Status: AC
  Administered 2015-08-11: 80 mg via INTRAVENOUS
  Filled 2015-08-11: qty 8

## 2015-08-11 NOTE — Progress Notes (Signed)
Addendum  Chest x-ray reviewed. Discussed with CCM MD. on call. Chest x-ray suggests worsening bilateral ASD representing edema versus multifocal pneumonia. Patient has recently completed a course of antibiotics, remains afebrile and has only mild leukocytosis without productive cough. Possible pulmonary edema. Trial of Lasix 80 mg IV 1 dose. Patient refusing CPAP and hence unlikely that he will cooperate with BiPAP. CCM will see 4/24. Poor prognosis.  Vernell Leep, MD, FACP, FHM. [facility] Pager [phone]  If 7PM-7AM, please contact night-coverage [email or web address] Password [facility] 08/11/2015, 12:50 PM

## 2015-08-11 NOTE — Progress Notes (Signed)
Pt was placed on 15LPM HF [place] for comfort. Pt sats 96%.

## 2015-08-11 NOTE — Progress Notes (Signed)
Patient very agitated removing equipment and attempting to get up. Wife unable to consol patient. O2 sats dropped into the 50s with good waveform. Patient placed on non-rebreather with positive results.  Palliative MD in room and requested early dosing of the Ativan. Ativan and Dilaudid given to patient, and patient overall more comfortable and compliant. Patient is now resting with O2 saturation of 96%.  Will continue to monitor.

## 2015-08-11 NOTE — Progress Notes (Signed)
Patient spit out Oxycodone tablets wasted with Jacklynn Lewis in sharps

## 2015-08-11 NOTE — Progress Notes (Signed)
Patient suddenly awoke removed equipment, and O2. Patient refused to get into bed and was hypoxic at 67%, prior to removing pulse ox. Multiple staff had to get patient back into bed despite combative patient. O2 replaced and pulse ox changed out. Patient given early dose of Dilaudid and Ativan. Patient now resting with O2 of 100% but maintaing HR in 130s. Palliative MD called and notified. She ordered a Dilaudid gtt. Will continue to monitor.

## 2015-08-11 NOTE — Progress Notes (Signed)
[place] Progress Note Patient Name: [name] DOB: 1950-10-12 MRN: [id number]   Date of Service  08/11/2015  HPI/Events of Note  Agitation - Stage IV lung CA.  eICU Interventions  Will order Dilaudid 2 mg IV now.      Intervention Category Minor Interventions: Agitation / anxiety - evaluation and management  Jacara Benito Eugene 08/11/2015, 5:20 PM

## 2015-08-11 NOTE — Progress Notes (Signed)
Patient received IV lasix and a condom cath was placed. No urine output since lasix, and bladder scan reported 600 mL of urine. MD called and order was received to place a foley catheter. Patient received Ativan prior to procedure, however became extremely combative. Four RNs were able to consol patient and  place foley. Once foley was placed patient became combative and non compliant attempting to remove equipment. E Link called and received one time bolus of Dilaudid. Mittens also placed for patient safety. Patient settled down and is now resting maintaining O2 saturation at 97%. Will continue to monitor.

## 2015-08-11 NOTE — Progress Notes (Signed)
Pt restless and requiring NRB at this time.  Home cpap held, RN aware.

## 2015-08-11 NOTE — Progress Notes (Signed)
Daily Progress Note   Patient Name: [name]       Date: 08/11/2015 DOB: December 07, 1950  Age: 65 y.o. MRN#: [id number] Attending Physician: Modena Jansky, MD Primary Care Physician: Fransisca Kaufmann Dettinger, MD Admit Date: 07/28/2015  Reason for Consultation/Follow-up: non pain symptom management of dyspnea, goals of care.   Subjective:  patient restless, anxious, dyspneic, tachypneic,confused, hypoxic,  "is this the end, am I dying?"  Interval Events/ Family meeting:  Patient is acutely hypoxic, O2 sats in the 50s, attempting to remove his O2.  He is restless, clutching his chest.  Significant other [name] now present at the bedside, patient lives in [place], [place] Patient has 3 children,wife reports he is kind of estranged from his children, one of his sons lives in [place] and he did visit with the patient on 08-10-15.  DNR DNI re confirmed Stat doses of IV 2 mg dilaudid and 1 mg IV Ativan given NRB mask applied 3 RNs, myself and RT in the room.  Goals of care discussed again with the patient and also with his significant other [name] Patient now agreeable to CT abdomen and pelvis, possible Biopsy if need be, how ever he is acutely in distress now with his hypoxia.   Discussed with [name] extensively at the bedside this am, she is agreeable to CT abd pelvis later today if possible. Blood work including kidney function and imaging reviewed and discussed in detail with [name].   Plan is still for radiation tomorrow. Patient and [name] asking if radiation treatments can be given at the cancer center at [facility] in the future. Have discussed about taking this one step at a time, starting with radiation treatments here, assessing patient's clinical status and overall condition on a daily  basis.   [name] and the patient have been together for 8 years, patient elects [name] to be his HCPOA agent, there is no official HCPOA document how ever. I have discussed with [name] about informing the patient's children regarding his current clinical status.    Length of Stay: 13 days  Current Medications: Scheduled Meds:  . amiodarone  400 mg Oral BID  . atorvastatin  20 mg Oral Daily  . buPROPion  150 mg Oral Daily  . fluticasone furoate-vilanterol  1  puff Inhalation Daily  . [START ON 08/12/2015] furosemide  40 mg Oral Daily  . gabapentin  600 mg Oral BID  . insulin aspart  0-20 Units Subcutaneous TID WC  . insulin aspart  0-5 Units Subcutaneous QHS  . ipratropium  0.5 mg Nebulization TID  . isosorbide mononitrate  60 mg Oral Daily  . levalbuterol  0.63 mg Nebulization TID  . oxyCODONE  30 mg Oral TID  . pantoprazole  40 mg Oral Daily    Continuous Infusions:    PRN Meds: fluticasone, guaiFENesin-dextromethorphan, HYDROmorphone (DILAUDID) injection, levalbuterol, LORazepam, ondansetron **OR** ondansetron (ZOFRAN) IV, technetium TC 54M diethylenetriame-pentaacetic acid  Physical Exam: Physical Exam             Restless confused Moderate distress S1 S2 Coarse rhonchi scattered wheeze Abdomen soft Trace edema Confused, is able to answer few questions appropriately.   Vital Signs: BP 153/65 mmHg  Pulse 96  Temp(Src) 97.9 F (36.6 C) (Axillary)  Resp 15  Ht '5\' 11"'$  (1.803 m)  Wt 88.9 kg (195 lb 15.8 oz)  BMI 27.35 kg/m2  SpO2 95% SpO2: SpO2: 95 % O2 Device: O2 Device: NRB O2 Flow Rate: O2 Flow Rate (L/min): 15 L/min  Intake/output summary:  Intake/Output Summary (Last 24 hours) at 08/11/15 0940 Last data filed at 08/11/15 7124  Gross per 24 hour  Intake    400 ml  Output   2600 ml  Net  -2200 ml   LBM: Last BM Date: 08/09/15 Baseline Weight: Weight: 91.4 kg (201 lb 8 oz) Most recent weight: Weight: 88.9 kg (195 lb 15.8 oz)       Palliative  Assessment/Data: Flowsheet Rows        Most Recent Value   Intake Tab    Referral Department  Hospitalist   Unit at Time of Referral  ICU   Palliative Care Primary Diagnosis  Cancer   Palliative Care Type  New Palliative care   Reason for referral  Non-pain Symptom, Clarify Goals of Care   Date first seen by Palliative Care  08/10/15   Clinical Assessment    Palliative Performance Scale Score  30%   Pain Max last 24 hours  7   Pain Min Last 24 hours  6   Dyspnea Max Last 24 Hours  8   Dyspnea Min Last 24 hours  7   Psychosocial & Spiritual Assessment    Palliative Care Outcomes    Patient/Family meeting held?  Yes   Who was at the meeting?  patient    Palliative Care follow-up planned  Yes, Facility      Additional Data Reviewed: CBC    Component Value Date/Time   WBC 12.2* 08/10/2015 1051   RBC 3.27* 08/10/2015 1051   RBC 4.38 07/24/2015 0334   HGB 9.0* 08/10/2015 1051   HCT 27.8* 08/10/2015 1051   PLT 191 08/10/2015 1051   MCV 85.0 08/10/2015 1051   MCH 27.5 08/10/2015 1051   MCHC 32.4 08/10/2015 1051   RDW 14.5 08/10/2015 1051   LYMPHSABS 0.7 07/31/2015 0242   MONOABS 1.3* 07/31/2015 0242   EOSABS 0.0 07/31/2015 0242   BASOSABS 0.0 07/31/2015 0242    CMP     Component Value Date/Time   NA 134* 08/10/2015 1051   K 4.3 08/10/2015 1051   CL 89* 08/10/2015 1051   CO2 33* 08/10/2015 1051   GLUCOSE 230* 08/10/2015 1051   BUN 22* 08/10/2015 1051   CREATININE 1.47* 08/10/2015 1051   CALCIUM 8.6* 08/10/2015 1051  PROT 7.0 07/28/2015 2141   ALBUMIN 3.3* 07/28/2015 2141   AST 37 07/28/2015 2141   ALT 21 07/28/2015 2141   ALKPHOS 68 07/28/2015 2141   BILITOT 0.7 07/28/2015 2141   GFRNONAA 49* 08/10/2015 1051   GFRAA 57* 08/10/2015 1051       Problem List:  Patient Active Problem List   Diagnosis Date Noted  . Anemia of chronic disease 08/10/2015  . Encounter for palliative care   . Adjustment disorder with mixed anxiety and depressed mood   . Weakness  of left arm   . Acute respiratory failure with hypoxia ([place])   . Stroke-like symptoms   . Chest pain on breathing   . Atrial fibrillation with RVR ([place])   . AKI (acute kidney injury) ([place]) 07/29/2015  . 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 Chronic back pain on oxycontin 90 mg daily 30 mg tid 07/26/2015  . 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 Chronic narcotic use oxycontin 30 mg tid 07/26/2015  . Weight loss 10 lbs in one month 07/26/2015  . Dysphagia, pharyngoesophageal phase   . GI bleeding 07/24/2015  . Left Lower Lung Mass and Adenopathy- pt declined Bx - presumed Lung Cancer.   . Hemoptysis   . Fracture of shaft of clavicle 06/18/2015  . Obstructive sleep apnea 04/12/2015  . Type 2 diabetes mellitus without complication, with long-term current use of insulin ([place]) 03/01/2015  . Coronary atherosclerosis of native coronary artery 02/13/2011  . Essential hypertension, benign 02/13/2011  . Tobacco abuse 02/13/2011  . Peripheral arterial disease ([place]) 02/13/2011  . Mixed hyperlipidemia 02/13/2011     Palliative Care Assessment & Plan    1.Code Status:  DNR    Code Status Orders        Start     Ordered   07/29/15 0106  Do not attempt resuscitation (DNR)   Continuous    Question Answer Comment  In the event of cardiac or respiratory ARREST Do not call a "code blue"   In the event of cardiac or respiratory ARREST Do not perform Intubation, CPR, defibrillation or ACLS   In the event of cardiac or respiratory ARREST Use medication by any route, position, wound care, and other measures to relive pain and suffering. May use oxygen, suction and manual treatment of airway obstruction as needed for comfort.      07/29/15 0109    Code Status History    Date Active Date Inactive Code Status Order ID Comments User Context   07/29/2015  1:09 AM  DNR [id number]  Alesia Richards, MD ED   07/24/2015  2:38 AM 07/27/2015  4:24 PM Full Code  [id number]  Orvan Falconer, MD Inpatient   02/14/2014  8:08 PM 02/15/2014 12:53 AM Full Code [id number]  Shanda Howells, MD ED       2. Goals of Care/Additional Recommendations:     Limitations on Scope of Treatment: prioritize comfort   Desire for further Chaplaincy support:no  Psycho-social Needs: Caregiving  Support/Resources  3. Symptom Management:      1. As above   4. Palliative Prophylaxis:   Bowel Regimen  5. Prognosis: < 2 weeks ?  6. Discharge Planning:  Pending hospital course.    Care plan was discussed with patient, significant other [name], 2 bedside ICU RNs, Dr Algis Liming.   Thank you for allowing the Palliative Medicine Team to assist in the care of this patient.   Time In:  8  Time Out: 930 Total Time 90 Prolonged Time Billed  no        [phone] Loistine Chance, MD  08/11/2015, 9:40 AM  Please contact Palliative Medicine Team phone at [phone] for questions and concerns.

## 2015-08-11 NOTE — Progress Notes (Signed)
Rt walked by room and pt was placed on 100% NRB. Per RN pt sats dropped in the 50's.No distress noted at this time.

## 2015-08-11 NOTE — Progress Notes (Signed)
Brief visit w/pt. offering [place] support, as he was dosing off to sleep. Please page [facility] if additional support is needed.  Chaplain Ernest Haber, M.Div.   08/11/15 1900  Clinical Encounter Type  Visited With Patient

## 2015-08-11 NOTE — Progress Notes (Addendum)
PROGRESS NOTE  [name]  JOA:[id number] DOB: 1950/07/27  DOA: 07/28/2015 PCP: Fransisca Kaufmann Dettinger, MD at [facility].  Outpatient Specialists:  Pain management: At [facility]/? Dr. Jeneen Rinks.  Brief Narrative:  65 year old male with extensive PMH-type II DM, HTN, HLD, OSA, chronic back pain on opioids, recent clavicular fracture, anxiety, depression, tobacco abuse, recent hospitalization for/4/17-07/27/15 for left sided lung mass, atypical chest pain, postobstructive pneumonia, seen by pulmonology and planned outpatient follow-up for fiberoptic bronchoscopy, presented to [facility] with hemoptysis and chest pain. He was hypoxic in the 80s and hypotensive, in the ED. He has refused diagnostic workup of his lung mass including bronchoscopy or biopsy or chemotherapy treatment. Oncology consulted and patient was transferred to [facility] stepdown unit on 08/09/15 to start palliative radiation treatment. Hospital course at [facility] complicated by A. fib with RVR, strokelike symptoms, persistent acute hypoxic respiratory failure and mild hemoptysis.   Assessment & Plan:   Principal Problem:   Left Lower Lung Mass and Adenopathy- pt declined Bx - presumed Lung Cancer. Active Problems:   Essential hypertension, benign   Tobacco abuse   Mixed hyperlipidemia   Type 2 diabetes mellitus without complication, with long-term current use of insulin ([facility])   Obstructive sleep apnea   Hemoptysis   Chronic back pain on oxycontin 90 mg daily 30 mg tid   AKI (acute kidney injury) ([place])   Atrial fibrillation with RVR ([facility])   Chest pain on breathing   Weakness of left arm   Acute respiratory failure with hypoxia ([facility])   Stroke-like symptoms   Anemia of chronic disease   Encounter for palliative care   Adjustment disorder with mixed anxiety and depressed mood   Goals of care, counseling/discussion   Left lower lung mass and adenopathy - Patient had declined diagnostic workup including  bronchoscopy or biopsy and has declined chemotherapy stating that he does not want to suffer. - As per medical oncology consultation 4/18: Need CT abdomen and pelvis as well as MRI brain with and without contrast for staging, tissue diagnosis, radiation oncology consultation for palliative RT to lung/mediastinal mass control hemoptysis and bronchial obstruction, not a good candidate for concurrent chemoradiation, would probably offer chemotherapy if this were small cell lung cancer since symptoms might improve with chemotherapy despite poor functional status. - Radiation oncology consulted 4/21 and recommended transfer to [facility] for radiation therapy - As per radiation oncology, CT simulation and starts radiation on 4/24. - Palliative care team met with patient and significant other. Patient now willing to undergo CT abdomen and pelvis for staging purposes (respiratory status and elevated creatinine limits up pending CT now but will reconsider). To me, patient declined bronchoscopy and biopsy.  Acute hypoxic respiratory failure - Multifactorial secondary to lung mass/cancer, postobstructive pneumonia (treated), COPD and acute diastolic CHF (compensated). - Continue oxygen support and titrate to maintain saturations >88%. - As per nursing, refused to keep CPAP overnight, kept taking of his Ventimask. Appears slightly more dyspneic today. Obtain stat chest x-ray.  Hemoptysis - Secondary to lung mass. - Supposed to start palliative radiation therapy which should help. Seems minimal and intermittent. Hemoglobin stable.  Left-sided chest pain - May be related to lung mass. - Palliative care team assisting with pain management. Discussed with Dr. Rowe Pavy.  Left-sided weakness - Acute symptoms began on 4/18. DD-metastatic disease. Neurology signed off 4/19. - MRI brain without contrast showed 1 cm bony lesion in the left clivus suggestive of bony metastasis. Since this was  done without  contrast, could not rule out metastatic disease. - Highly suspicious for brain metastasis. - No strokes seen on MRI.   A. Fib - Rate controlled on amiodarone. Not candidate for anticoagulation secondary to hemoptysis. - Cardiology have seen: As per recommendations on 4/19, amiodarone 200 MG twice a day 2 weeks (starting 4/19) and then decrease to 200 MG daily-may be a good long-term treatment to prevent recurrent A. fib given disease process and overall poor prognosis.  Postobstructive pneumonia/HCAP - Completed antibiotics  Acute kidney injury - Creatinine on admission was 2.48. This had normalized but has gradually increased to 1.46-likely secondary to IV diuresis. Lasix currently held. Creatinine has plateaued in the 1.4 range. Follow BMP in a.m.  Recent clavicular fracture/chronic pain - Continue pain regimen  Anemia of chronic disease/iron deficiency - Hemoglobin stable.  Hyperlipidemia - Continue Lipitor.  Anxiety & depression - Continue Wellbutrin and when necessary Ativan.  Tobacco abuse - Cessation counseled. Continue nicotine patch.  Acute diastolic CHF - Seems euvolemic and creatinine starting to rise. Lasix currently on hold.  OSA - Nightly CPAP-patient refusing  GERD - Continue PPI.  Confusion - Overnight intermittent confusion. May be related to hypoxia/? Brain metastases.  COPD - Continue scheduled and when necessary bronchodilators, oxygen,    DVT prophylaxis: SCDs  Code Status: DO NOT RESUSCITATE Family Communication: Discussed with patient. None at bedside.  Disposition Plan: Initially admitted to [facility]. Transferred to [facility] stepdown unit on 4/21. DC home when medically stable.   Consultants:   Pulmonary and critical care  Cardiology  Palliative care medicine  Medical oncology  Neurology  Radiation oncology  Procedures:   2-D echo 08/06/15: Study Conclusions  - Left ventricle: The cavity size was normal. Wall  thickness was  normal. Systolic function was normal. The estimated ejection  fraction was in the range of 60% to 65%. Wall motion was normal;  there were no regional wall motion abnormalities. Doppler  parameters are consistent with abnormal left ventricular  relaxation (grade 1 diastolic dysfunction). - Left atrium: The atrium was normal in size.   Carotid Dopplers 08/08/15: Summary: The right internal carotid artery exhibits 1-39% stenosis.  The left internal carotid artery exhibits elevated peak systolic velocities suggestive of 80-99% stenosis, elevated end diastolic velocities suggestive of low range 60-79% stenosis, and ICA/CCA ratio suggestive of 60-79% stenosis, however there is no evidence of significant plaque morphology to support high grade stenosis.  The left external carotid artery exhibits elevated waveforms suggestive of >50% stenosis.   Antimicrobials:   Levofloxacin: 4/14 > 4/19  Zosyn: 4/9 > 4/14   Linezolid 4/10 > 4/13   Subjective: As per RN, refused CPAP overnight, keeps pulling off Ventimask, slightly confused and no hemoptysis. Patient does appear slightly confused but oriented 3.  Objective:  Filed Vitals:   08/11/15 0800 08/11/15 0830 08/11/15 0841 08/11/15 0908  BP:      Pulse:      Temp: 97.9 F (36.6 C)     TempSrc: Axillary     Resp:      Height:      Weight:      SpO2:  98% 96% 95%  Pulse 96/m, respiratory rate 15/m, blood pressure 153/65 mmHg.  Intake/Output Summary (Last 24 hours) at 08/11/15 1028 Last data filed at 08/11/15 0520  Gross per 24 hour  Intake      0 ml  Output   2600 ml  Net  -2600 ml   Filed Weights   08/09/15  0400 08/10/15 0500 08/11/15 0500  Weight: 91.6 kg (201 lb 15.1 oz) 91.1 kg (200 lb 13.4 oz) 88.9 kg (195 lb 15.8 oz)    Examination:  General exam: Pleasant middle-aged male, moderately built and nourished, sitting up comfortably in the chair with Ventimask on. Respiratory system: Reduced  breath sounds bilaterally, left >right with scattered few expiratory rhonchi and bibasal crackles. Respiratory effort normal. Cardiovascular system: S1 & S2 heard, RRR.Marland Kitchen No JVD, murmurs, rubs, gallops or clicks. No pedal edema. Telemetry: Sinus rhythm. Gastrointestinal system: Abdomen is nondistended, soft and nontender. No organomegaly or masses felt. Normal bowel sounds heard. Central nervous system: Alert and oriented. No focal neurological deficits. Extremities: 5 x 5 power in right extremities, 4 x 5 power in left upper extremity with pronator drift and 4+/5 power in left lower extremity.. Skin: No rashes, lesions or ulcers Psychiatry: Judgement and insight appear normal. Mood & affect appropriate.     Data Reviewed: I have personally reviewed following labs and imaging studies  CBC:  Recent Labs Lab 08/07/15 0025 08/08/15 0236 08/10/15 1051  WBC 13.4* 14.0* 12.2*  HGB 9.4* 9.4* 9.0*  HCT 28.9* 29.8* 27.8*  MCV 85.8 85.9 85.0  PLT 171 202 832   Basic Metabolic Panel:  Recent Labs Lab 08/04/15 2338 08/05/15 1758 08/07/15 0025 08/08/15 0236 08/10/15 1051  NA 135 136 135 135 134*  K 3.5 3.2* 4.4 4.5 4.3  CL 93* 93* 96* 91* 89*  CO2 28 28 27 29  33*  GLUCOSE 136* 148* 143* 169* 230*  BUN 15 14 13 15  22*  CREATININE 1.19 1.22 1.26* 1.46* 1.47*  CALCIUM 8.0* 8.0* 8.4* 8.5* 8.6*   GFR: Estimated Creatinine Clearance: 55.5 mL/min (by C-G formula based on Cr of 1.47). Liver Function Tests: No results for input(s): AST, ALT, ALKPHOS, BILITOT, PROT, ALBUMIN in the last 168 hours. No results for input(s): LIPASE, AMYLASE in the last 168 hours. No results for input(s): AMMONIA in the last 168 hours. Coagulation Profile: No results for input(s): INR, PROTIME in the last 168 hours. Cardiac Enzymes:  Recent Labs Lab 08/05/15 1040 08/05/15 1758 08/05/15 2159  TROPONINI <0.03 <0.03 <0.03   BNP (last 3 results) No results for input(s): PROBNP in the last 8760  hours. HbA1C: No results for input(s): HGBA1C in the last 72 hours. CBG:  Recent Labs Lab 08/10/15 0727 08/10/15 1131 08/10/15 1610 08/10/15 2145 08/11/15 0759  GLUCAP 147* 210* 162* 201* 189*   Lipid Profile: No results for input(s): CHOL, HDL, LDLCALC, TRIG, CHOLHDL, LDLDIRECT in the last 72 hours. Thyroid Function Tests: No results for input(s): TSH, T4TOTAL, FREET4, T3FREE, THYROIDAB in the last 72 hours. Anemia Panel: No results for input(s): VITAMINB12, FOLATE, FERRITIN, TIBC, IRON, RETICCTPCT in the last 72 hours.   Recent Results (from the past 240 hour(s))  MRSA PCR Screening     Status: None   Collection Time: 08/09/15  5:33 PM  Result Value Ref Range Status   MRSA by PCR NEGATIVE NEGATIVE Final    Comment:        The GeneXpert MRSA Assay (FDA approved for NASAL specimens only), is one component of a comprehensive MRSA colonization surveillance program. It is not intended to diagnose MRSA infection nor to guide or monitor treatment for MRSA infections.          Radiology Studies: No results found.      Scheduled Meds: . amiodarone  400 mg Oral BID  . atorvastatin  20 mg Oral Daily  . buPROPion  150 mg Oral Daily  . fluticasone furoate-vilanterol  1 puff Inhalation Daily  . [START ON 08/12/2015] furosemide  40 mg Oral Daily  . gabapentin  600 mg Oral BID  . insulin aspart  0-20 Units Subcutaneous TID WC  . insulin aspart  0-5 Units Subcutaneous QHS  . ipratropium  0.5 mg Nebulization TID  . isosorbide mononitrate  60 mg Oral Daily  . levalbuterol  0.63 mg Nebulization TID  . oxyCODONE  30 mg Oral TID  . pantoprazole  40 mg Oral Daily   Continuous Infusions:    LOS: 13 days    Time spent: 35 minutes.    [facility], MD [facility] Pager [phone]  If 7PM-7AM, please contact night-coverage [email or web address] Password [facility] 08/11/2015, 10:28 AM

## 2015-08-12 ENCOUNTER — Ambulatory Visit: Admit: 2015-08-12 | Payer: Medicare Other | Admitting: Radiation Oncology

## 2015-08-12 ENCOUNTER — Ambulatory Visit: Payer: Medicare Other | Admitting: Radiation Oncology

## 2015-08-12 ENCOUNTER — Telehealth: Payer: Self-pay | Admitting: *Deleted

## 2015-08-12 ENCOUNTER — Inpatient Hospital Stay (HOSPITAL_COMMUNITY): Payer: Medicare Other

## 2015-08-12 DIAGNOSIS — R338 Other retention of urine: Secondary | ICD-10-CM | POA: Diagnosis not present

## 2015-08-12 DIAGNOSIS — C3432 Malignant neoplasm of lower lobe, left bronchus or lung: Principal | ICD-10-CM

## 2015-08-12 DIAGNOSIS — J9601 Acute respiratory failure with hypoxia: Secondary | ICD-10-CM

## 2015-08-12 DIAGNOSIS — G9340 Encephalopathy, unspecified: Secondary | ICD-10-CM

## 2015-08-12 LAB — BASIC METABOLIC PANEL
ANION GAP: 10 (ref 5–15)
BUN: 28 mg/dL — ABNORMAL HIGH (ref 6–20)
CALCIUM: 9.5 mg/dL (ref 8.9–10.3)
CO2: 36 mmol/L — ABNORMAL HIGH (ref 22–32)
Chloride: 93 mmol/L — ABNORMAL LOW (ref 101–111)
Creatinine, Ser: 1.41 mg/dL — ABNORMAL HIGH (ref 0.61–1.24)
GFR, EST NON AFRICAN AMERICAN: 52 mL/min — AB (ref >60)
GLUCOSE: 226 mg/dL — AB (ref 65–99)
POTASSIUM: 5.1 mmol/L (ref 3.5–5.1)
SODIUM: 139 mmol/L (ref 135–145)

## 2015-08-12 LAB — GLUCOSE, CAPILLARY: Glucose-Capillary: 207 mg/dL — ABNORMAL HIGH (ref 65–99)

## 2015-08-12 NOTE — Progress Notes (Signed)
PT Cancellation Note  Patient Details Name: [name] MRN: [id number] DOB: 02/10/1951   Cancelled Treatment:    Reason Eval/Treat Not Completed: Medical issues which prohibited therapy   Weston Anna, MPT Pager: [phone]

## 2015-08-12 NOTE — Progress Notes (Signed)
Deep oral sxn done-small yellow white. No gag

## 2015-08-12 NOTE — Progress Notes (Signed)
Daily Progress Note   Patient Name: [name]       Date: 08/12/2015 DOB: 1951/03/20  Age: 65 y.o. MRN#: [id number] Attending Physician: Modena Jansky, MD Primary Care Physician: Fransisca Kaufmann Dettinger, MD Admit Date: 07/28/2015  Reason for Consultation/Follow-up: non pain symptom management of dyspnea, goals of care.   Subjective:  patient restless and agitated at times, at times is resting comfortably  Interval Events/ Family meeting:  Patient is not awake, not alert anymore Have discussed with RN, Dr Algis Liming, significant other Ms Page about patient not being appropriate for radiation treatments.  Patient appears to have entered into the dying process, this has been frankly discussed with Ms Page. Prognosis hours to days discussed.  Continuing efforts to titrate Dilaudid infusion which is helping keep the patient comfortable. Also has PRN Bolus dosages available.    Daughter arrived at the bedside this afternoon. Patient's hospital course re discussed with her again.  At this time, there are several family members, patient's 2 children, patient's brothers and sisters who are on their way to the hospital from various states.  Daughter and Ms Page asking about transferring the patient to residential hospice.  Recommend continuation of Dilaudid infusion IV, patient has history of chronic pain, he is at present comfortable on the IV Dilaudid infusion.  He has Ativan IV PRN, this can be changed to SL at hospice I have D/C PO medications and insulin sliding scale.  Agree that hospice setting will be much more appropriate for the family at this time.  Social work consultation placed.   Length of Stay: 14 days  Current Medications: Scheduled Meds:  . fluticasone furoate-vilanterol  1  puff Inhalation Daily  . insulin aspart  0-5 Units Subcutaneous QHS  . ipratropium  0.5 mg Nebulization TID  . isosorbide mononitrate  60 mg Oral Daily  . levalbuterol  0.63 mg Nebulization TID    Continuous Infusions: . HYDROmorphone 2 mg/hr (08/11/15 2002)    PRN Meds: fluticasone, guaiFENesin-dextromethorphan, HYDROmorphone **AND** HYDROmorphone, levalbuterol, LORazepam, ondansetron **OR** ondansetron (ZOFRAN) IV, technetium TC 55M diethylenetriame-pentaacetic acid  Physical Exam: Physical Exam             Restless confused Moderate distress S1 S2 Coarse rhonchi scattered wheeze Abdomen soft Trace edema Confused, is able to answer few questions appropriately.  Vital Signs: BP 153/59 mmHg  Pulse 95  Temp(Src) 98.6 F (37 C) (Axillary)  Resp 13  Ht '5\' 11"'$  (1.803 m)  Wt 88.8 kg (195 lb 12.3 oz)  BMI 27.32 kg/m2  SpO2 95% SpO2: SpO2: 95 % O2 Device: O2 Device: Venturi Mask O2 Flow Rate: O2 Flow Rate (L/min): 14 L/min  Intake/output summary:   Intake/Output Summary (Last 24 hours) at 08/12/15 1340 Last data filed at 08/12/15 0800  Gross per 24 hour  Intake  184.2 ml  Output   1500 ml  Net -1315.8 ml   LBM: Last BM Date: 08/09/15 Baseline Weight: Weight: 91.4 kg (201 lb 8 oz) Most recent weight: Weight: 88.8 kg (195 lb 12.3 oz)       Palliative Assessment/Data: Flowsheet Rows        Most Recent Value   Intake Tab    Referral Department  Hospitalist   Unit at Time of Referral  ICU   Palliative Care Primary Diagnosis  Cancer   Palliative Care Type  New Palliative care   Reason for referral  Non-pain Symptom, Clarify Goals of Care   Date first seen by Palliative Care  08/10/15   Clinical Assessment    Palliative Performance Scale Score  30%   Pain Max last 24 hours  7   Pain Min Last 24 hours  6   Dyspnea Max Last 24 Hours  8   Dyspnea Min Last 24 hours  7   Psychosocial & Spiritual Assessment    Palliative Care Outcomes    Patient/Family meeting held?   Yes   Who was at the meeting?  patient    Palliative Care follow-up planned  Yes, Facility      Additional Data Reviewed: CBC    Component Value Date/Time   WBC 12.2* 08/10/2015 1051   RBC 3.27* 08/10/2015 1051   RBC 4.38 07/24/2015 0334   HGB 9.0* 08/10/2015 1051   HCT 27.8* 08/10/2015 1051   PLT 191 08/10/2015 1051   MCV 85.0 08/10/2015 1051   MCH 27.5 08/10/2015 1051   MCHC 32.4 08/10/2015 1051   RDW 14.5 08/10/2015 1051   LYMPHSABS 0.7 07/31/2015 0242   MONOABS 1.3* 07/31/2015 0242   EOSABS 0.0 07/31/2015 0242   BASOSABS 0.0 07/31/2015 0242    CMP     Component Value Date/Time   NA 139 08/12/2015 0335   K 5.1 08/12/2015 0335   CL 93* 08/12/2015 0335   CO2 36* 08/12/2015 0335   GLUCOSE 226* 08/12/2015 0335   BUN 28* 08/12/2015 0335   CREATININE 1.41* 08/12/2015 0335   CALCIUM 9.5 08/12/2015 0335   PROT 7.0 07/28/2015 2141   ALBUMIN 3.3* 07/28/2015 2141   AST 37 07/28/2015 2141   ALT 21 07/28/2015 2141   ALKPHOS 68 07/28/2015 2141   BILITOT 0.7 07/28/2015 2141   GFRNONAA 52* 08/12/2015 0335   GFRAA >60 08/12/2015 0335       Problem List:  Patient Active Problem List   Diagnosis Date Noted  . Acute urinary retention 08/12/2015  . Acute encephalopathy   . Goals of care, counseling/discussion   . Anemia of chronic disease 08/10/2015  . Encounter for palliative care   . Adjustment disorder with mixed anxiety and depressed mood   . Weakness of left arm   . Acute respiratory failure with hypoxia ([place])   . Stroke-like symptoms   . Chest pain on breathing   . Atrial fibrillation with RVR ([place])   . AKI (acute kidney injury) (  [place]) 07/29/2015  . 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 Chronic back pain on oxycontin 90 mg daily 30 mg tid 07/26/2015  . 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 Chronic narcotic use oxycontin 30 mg tid 07/26/2015  . Weight loss 10 lbs in one month 07/26/2015  . Dysphagia,  pharyngoesophageal phase   . GI bleeding 07/24/2015  . Left Lower Lung Mass and Adenopathy- pt declined Bx - presumed Lung Cancer.   . Hemoptysis   . Fracture of shaft of clavicle 06/18/2015  . Obstructive sleep apnea 04/12/2015  . Type 2 diabetes mellitus without complication, with long-term current use of insulin ([place]) 03/01/2015  . Coronary atherosclerosis of native coronary artery 02/13/2011  . Essential hypertension, benign 02/13/2011  . Tobacco abuse 02/13/2011  . Peripheral arterial disease ([place]) 02/13/2011  . Mixed hyperlipidemia 02/13/2011     Palliative Care Assessment & Plan    1.Code Status:  DNR    Code Status Orders        Start     Ordered   07/29/15 0106  Do not attempt resuscitation (DNR)   Continuous    Question Answer Comment  In the event of cardiac or respiratory ARREST Do not call a "code blue"   In the event of cardiac or respiratory ARREST Do not perform Intubation, CPR, defibrillation or ACLS   In the event of cardiac or respiratory ARREST Use medication by any route, position, wound care, and other measures to relive pain and suffering. May use oxygen, suction and manual treatment of airway obstruction as needed for comfort.      07/29/15 0109    Code Status History    Date Active Date Inactive Code Status Order ID Comments User Context   07/29/2015  1:09 AM  DNR [id number]  Alesia Richards, MD ED   07/24/2015  2:38 AM 07/27/2015  4:24 PM Full Code [id number]  Orvan Falconer, MD Inpatient   02/14/2014  8:08 PM 02/15/2014 12:53 AM Full Code [id number]  Shanda Howells, MD ED       2. Goals of Care/Additional Recommendations:     Limitations on Scope of Treatment: prioritize comfort   Desire for further Chaplaincy support:no  Psycho-social Needs: Caregiving  Support/Resources  3. Symptom Management:      1. As above   4. Palliative Prophylaxis:   Bowel Regimen  5. Prognosis: hours to days 6. Discharge Planning:  Residential hospice versus  anticipated hospital death   Care plan was discussed with patient's daughter, significant other Ms Page, bedside ICU RN, Dr Algis Liming.   Thank you for allowing the Palliative Medicine Team to assist in the care of this patient.   Time In:  1300 Time Out: 1335 Total Time 35 Prolonged Time Billed  no        [id number] Loistine Chance, MD  08/12/2015, 1:40 PM  Please contact Palliative Medicine Team phone at [phone] for questions and concerns.

## 2015-08-12 NOTE — Progress Notes (Signed)
CSW received consult from Dr. Anwar for residential hospice placement. CSW met with patient's significant other, Gail & daughter, Jennifer at bedside to confirm plans for residential hospice. Family is requesting [facility] as first choice. CSW has called in referral to Eva Davis, [facility] Liaison - awaiting call back re: bed availability/eligbility.    Kelly Harrison, LCSW [facility] Clinical Social Worker cell #: [phone]  

## 2015-08-12 NOTE — Progress Notes (Signed)
Date:  August 12, 2015 Chart reviewed for concurrent status and case management needs. Will continue to follow patient for changes and needs: resp. Distress venti mask at 100% Velva Harman, BSN, [place], [place]

## 2015-08-12 NOTE — Progress Notes (Addendum)
PROGRESS NOTE  [name]  YOV:[id number] DOB: 03-22-1951  DOA: 07/28/2015 PCP: Fransisca Kaufmann Dettinger, MD at [facility].  Outpatient Specialists:  Pain management: At [facility] Dr. Jeneen Rinks.  Brief Narrative:  65 year old male with extensive PMH-type II DM, HTN, HLD, OSA, chronic back pain on opioids, recent clavicular fracture, anxiety, depression, tobacco abuse, recent hospitalization for/4/17-07/27/15 for left sided lung mass, atypical chest pain, postobstructive pneumonia, seen by pulmonology and planned outpatient follow-up for fiberoptic bronchoscopy, presented to [facility] with hemoptysis and chest pain. He was hypoxic in the 80s and hypotensive, in the ED. He has refused diagnostic workup of his lung mass including bronchoscopy or biopsy or chemotherapy treatment. Oncology consulted and patient was transferred to [facility] stepdown unit on 08/09/15 to start palliative radiation treatment. Hospital course at [facility] complicated by A. fib with RVR, strokelike symptoms, persistent acute hypoxic respiratory failure and mild hemoptysis. Patient rapidly declined on 4/23 with worsening hypoxia and mental status changes. Palliative consulted. Placed on IV Dilaudid drip. Await palliative follow-up regarding verification of goals of care. Unstable to go down to radiation oncology for simulation or initiation of palliative XRT.   Assessment & Plan:   Principal Problem:   Left Lower Lung Mass and Adenopathy- pt declined Bx - presumed Lung Cancer. Active Problems:   Essential hypertension, benign   Tobacco abuse   Mixed hyperlipidemia   Type 2 diabetes mellitus without complication, with long-term current use of insulin (H[facility])   Obstructive sleep apnea   Hemoptysis   Chronic back pain on oxycontin 90 mg daily 30 mg tid   AKI (acute kidney injury) ([place])   Atrial fibrillation with RVR (H[facility])   Chest pain on breathing   Weakness of left arm   Acute respiratory failure with hypoxia  (H[facility])   Stroke-like symptoms   Anemia of chronic disease   Encounter for palliative care   Adjustment disorder with mixed anxiety and depressed mood   Goals of care, counseling/discussion   Acute urinary retention   Left lower lung mass and adenopathy - Patient had declined diagnostic workup including bronchoscopy or biopsy and has declined chemotherapy stating that he does not want to suffer. - As per medical oncology consultation 4/18: Need CT abdomen and pelvis as well as MRI brain with and without contrast for staging, tissue diagnosis, radiation oncology consultation for palliative RT to lung/mediastinal mass control hemoptysis and bronchial obstruction, not a good candidate for concurrent chemoradiation, would probably offer chemotherapy if this were small cell lung cancer since symptoms might improve with chemotherapy despite poor functional status. - Radiation oncology consulted 4/21 and recommended transfer to [facility] for radiation therapy - As per radiation oncology, CT simulation and supposed to start radiation on 4/24. - Palliative care team met with patient and significant other on 4/23. Patient now willing to undergo CT abdomen and pelvis for staging purposes (respiratory status and elevated creatinine limits CT now, but will reconsider). Patient again declined bronchoscopy and biopsy. - On 4/23, patient rapidly declined with worsening dyspnea, hypoxia, mental status changes/delirium, acute urinary retention. Palliative team has started patient on Dilaudid drip, Foley catheter was placed. I discussed with Dr. Rowe Pavy who has seen patient today and discussed with patient's significant other and plan of care is comfort oriented care at this time.  Acute hypoxic respiratory failure - Multifactorial secondary to lung mass/cancer, postobstructive pneumonia (treated), COPD and acute diastolic CHF (compensated). - Continue oxygen support and titrate to maintain saturations  >88%. -  Dyspnea and hypoxia worsened on 4/23. Chest x-ray showed worsening airspace disease suggestive of pneumonia versus pulmonary edema. Recently completed course of antibiotics. Discussed with CCM and diuresed with Lasix 80 mg IV 1. Good diuresis. Dyspnea improved. Remains on 100% NRBM.  Hemoptysis - Secondary to lung mass. - Supposed to start palliative radiation therapy which should help. Seems minimal and intermittent. Hemoglobin stable.  Left-sided chest pain - May be related to lung mass. - Palliative care team assisting with pain management. Pain controlled.  Left-sided weakness - Acute symptoms began on 4/18. DD-metastatic disease. Neurology signed off 4/19. - MRI brain without contrast showed 1 cm bony lesion in the left clivus suggestive of bony metastasis. Since this was done without contrast, could not rule out metastatic disease. - Highly suspicious for brain metastasis. - No strokes seen on MRI.   A. Fib - Rate controlled on amiodarone. Not candidate for anticoagulation secondary to hemoptysis. - Cardiology have seen: As per recommendations on 4/19, amiodarone 200 MG twice a day 2 weeks (starting 4/19) and then decrease to 200 MG daily-may be a good long-term treatment to prevent recurrent A. fib given disease process and overall poor prognosis. - Had approximately 3 hours of A. fib with RVR on 08/11/15 which spontaneously reverted to sinus rhythm.  Postobstructive pneumonia/HCAP - Completed antibiotics  Acute kidney injury - Creatinine on admission was 2.48. This had normalized but has gradually increased to 1.46-likely secondary to IV diuresis. Lasix currently held. Creatinine has plateaued in the 1.4 range. Creatinine stable at 1.4 range.  Recent clavicular fracture/chronic pain - Continue pain regimen  Anemia of chronic disease/iron deficiency - Hemoglobin stable.  Hyperlipidemia - Continue Lipitor.  Anxiety & depression - Continue Wellbutrin and when  necessary Ativan.  Tobacco abuse - Cessation counseled. Continue nicotine patch.  Acute diastolic CHF - Seems euvolemic and creatinine starting to rise. Lasix currently on hold.  OSA - Nightly CPAP-patient refusing  GERD - Continue PPI.  Confusion/delirium/acute encephalopathy - Multifactorial secondary to hypoxia,? Brain metastases and terminal delirium. Management per palliative care team. On prn Ativan and IV Dilaudid drip. Not agitated at this time.  COPD - Continue scheduled and when necessary bronchodilators, oxygen,    DVT prophylaxis: SCDs  Code Status: DO NOT RESUSCITATE Family Communication: Discussed with patient. None at bedside. Left message for patient's significant other [name] Page. Disposition Plan: Initially admitted to [facility]. Transferred to [facility] stepdown unit on 4/21. Rapidly deteriorated. May be a hospital death.  Consultants:   Pulmonary and critical care  Cardiology  Palliative care medicine  Medical oncology  Neurology  Radiation oncology  Procedures:   2-D echo 08/06/15: Study Conclusions  - Left ventricle: The cavity size was normal. Wall thickness was  normal. Systolic function was normal. The estimated ejection  fraction was in the range of 60% to 65%. Wall motion was normal;  there were no regional wall motion abnormalities. Doppler  parameters are consistent with abnormal left ventricular  relaxation (grade 1 diastolic dysfunction). - Left atrium: The atrium was normal in size.   Carotid Dopplers 08/08/15: Summary: The right internal carotid artery exhibits 1-39% stenosis.  The left internal carotid artery exhibits elevated peak systolic velocities suggestive of 80-99% stenosis, elevated end diastolic velocities suggestive of low range 60-79% stenosis, and ICA/CCA ratio suggestive of 60-79% stenosis, however there is no evidence of significant plaque morphology to support high grade stenosis.  The left  external carotid artery exhibits elevated waveforms suggestive of >50% stenosis.  Antimicrobials:   Levofloxacin: 4/14 > 4/19  Zosyn: 4/9 > 4/14   Linezolid 4/10 > 4/13   Subjective: Patient sedated and nonverbal. As per RN, overnight agitation, attempts to pull out facemask/NRBM, Foley catheter and to get out of bed. Remains on IV Dilaudid drip and when necessary Ativan.  Objective:  Filed Vitals:   08/12/15 0600 08/12/15 0700 08/12/15 0740 08/12/15 0800  BP: 143/53 158/60  153/59  Pulse: 96 96  95  Temp:   98 F (36.7 C)   TempSrc:   Axillary   Resp: 11 12  13   Height:      Weight: 88.8 kg (195 lb 12.3 oz)     SpO2: 100% 100%  95%    Intake/Output Summary (Last 24 hours) at 08/12/15 1004 Last data filed at 08/12/15 0800  Gross per 24 hour  Intake  184.2 ml  Output   1500 ml  Net -1315.8 ml   Filed Weights   08/10/15 0500 08/11/15 0500 08/12/15 0600  Weight: 91.1 kg (200 lb 13.4 oz) 88.9 kg (195 lb 15.8 oz) 88.8 kg (195 lb 12.3 oz)    Examination:  General exam: Pleasant middle-aged male, moderately built and nourished, lying comfortably propped up in bed and in no obvious distress. Has nonrebreather mask on. Respiratory system: Reduced breath sounds bilaterally-slightly improved compared to yesterday. Scattered few expiratory rhonchi and basal crackles. Respiratory effort normal. Cardiovascular system: S1 & S2 heard, RRR.Marland Kitchen No JVD, murmurs, rubs, gallops or clicks. No pedal edema. Telemetry: Sinus rhythm. Had period of A. fib with RVR in the 120s-140s between 5:06 PM and 7:45 PM on 4/23. Spontaneously reverted to sinus rhythm after that. Gastrointestinal system: Abdomen is nondistended, soft and nontender. No organomegaly or masses felt. Normal bowel sounds heard. Central nervous system: Sedated. No response to call or touch. No focal neurological deficits. Extremities: 5 x 5 power in right extremities, 4 x 5 power in left upper extremity with pronator drift and  4+/5 power in left lower extremity.. Skin: No rashes, lesions or ulcers Psychiatry: Unable to assess today.    Data Reviewed: I have personally reviewed following labs and imaging studies  CBC:  Recent Labs Lab 08/07/15 0025 08/08/15 0236 08/10/15 1051  WBC 13.4* 14.0* 12.2*  HGB 9.4* 9.4* 9.0*  HCT 28.9* 29.8* 27.8*  MCV 85.8 85.9 85.0  PLT 171 202 226   Basic Metabolic Panel:  Recent Labs Lab 08/05/15 1758 08/07/15 0025 08/08/15 0236 08/10/15 1051 08/12/15 0335  NA 136 135 135 134* 139  K 3.2* 4.4 4.5 4.3 5.1  CL 93* 96* 91* 89* 93*  CO2 28 27 29  33* 36*  GLUCOSE 148* 143* 169* 230* 226*  BUN 14 13 15  22* 28*  CREATININE 1.22 1.26* 1.46* 1.47* 1.41*  CALCIUM 8.0* 8.4* 8.5* 8.6* 9.5   GFR: Estimated Creatinine Clearance: 57.9 mL/min (by C-G formula based on Cr of 1.41). Liver Function Tests: No results for input(s): AST, ALT, ALKPHOS, BILITOT, PROT, ALBUMIN in the last 168 hours. No results for input(s): LIPASE, AMYLASE in the last 168 hours. No results for input(s): AMMONIA in the last 168 hours. Coagulation Profile: No results for input(s): INR, PROTIME in the last 168 hours. Cardiac Enzymes:  Recent Labs Lab 08/05/15 1040 08/05/15 1758 08/05/15 2159  TROPONINI <0.03 <0.03 <0.03   BNP (last 3 results) No results for input(s): PROBNP in the last 8760 hours. HbA1C: No results for input(s): HGBA1C in the last 72 hours. CBG:  Recent Labs Lab 08/11/15 0759  08/11/15 1135 08/11/15 1603 08/11/15 2224 08/12/15 0730  GLUCAP 189* 190* 163* 195* 207*   Lipid Profile: No results for input(s): CHOL, HDL, LDLCALC, TRIG, CHOLHDL, LDLDIRECT in the last 72 hours. Thyroid Function Tests: No results for input(s): TSH, T4TOTAL, FREET4, T3FREE, THYROIDAB in the last 72 hours. Anemia Panel: No results for input(s): VITAMINB12, FOLATE, FERRITIN, TIBC, IRON, RETICCTPCT in the last 72 hours.   Recent Results (from the past 240 hour(s))  MRSA PCR Screening      Status: None   Collection Time: 08/09/15  5:33 PM  Result Value Ref Range Status   MRSA by PCR NEGATIVE NEGATIVE Final    Comment:        The GeneXpert MRSA Assay (FDA approved for NASAL specimens only), is one component of a comprehensive MRSA colonization surveillance program. It is not intended to diagnose MRSA infection nor to guide or monitor treatment for MRSA infections.          Radiology Studies: Dg Chest Port 1 View  08/11/2015  CLINICAL DATA:  Short of breath, hypertension EXAM: PORTABLE CHEST 1 VIEW COMPARISON:  None. FINDINGS: Stable enlarged cardiac silhouette. There is increased density in the LEFT lobe airspace disease. Persistent RIGHT upper lobe airspace disease and more diffuse bilateral airspace disease again noted. IMPRESSION: Worsening bilateral airspace disease representing edema versus multifocal pneumonia. Electronically Signed   By: Suzy Bouchard M.D.   On: 08/11/2015 11:21        Scheduled Meds: . amiodarone  400 mg Oral BID  . atorvastatin  20 mg Oral Daily  . buPROPion  150 mg Oral Daily  . fluticasone furoate-vilanterol  1 puff Inhalation Daily  . gabapentin  600 mg Oral BID  . insulin aspart  0-20 Units Subcutaneous TID WC  . insulin aspart  0-5 Units Subcutaneous QHS  . ipratropium  0.5 mg Nebulization TID  . isosorbide mononitrate  60 mg Oral Daily  . levalbuterol  0.63 mg Nebulization TID  . oxyCODONE  30 mg Oral TID  . pantoprazole  40 mg Oral Daily   Continuous Infusions: . HYDROmorphone 2 mg/hr (08/11/15 2002)     LOS: 14 days    Time spent: 35 minutes.    [facility], MD [facility] Pager [phone]  If 7PM-7AM, please contact night-coverage [email or web address] Password [email or web address] 08/12/2015, 10:04 AM

## 2015-08-12 NOTE — Progress Notes (Signed)
PCCM Brief Note  Asked to evaluate patient with presumed Stage IV primary lung CA (no tissue dx) and evolving respiratory failure. Note that he has been evaluated by Palliative Care, that he has refused multiple interventions including BiPAP recently and also bronchoscopy for tissue.  I find him this am unresponsive with snoring respirations, on a dilaudid gtt. He does not appear uncomfortable.  I do not believe that he will have a meaningful recovery from current status, would be very unlikely unless he were to pursue invasive maneuvers such as extended MV, biopsy, XRT. He has indicated that he doesn't want invasive care. I agree with the transition to comfort given his wishes. Please call if we can help you in any way.    Baltazar Apo, MD, PhD 08/12/2015, 10:04 AM [facility] [phone] or if no answer [phone]

## 2015-08-12 NOTE — Progress Notes (Signed)
Patients family only wanting to make patient comfort care. They do not want additional testing, xrays etc. Dr. Rowe Pavy with palliative coming to meet with daughter and girlfriend this afternoon to discuss goals of care. Family questioning whether pt can transfer to hospice house.

## 2015-08-12 NOTE — Progress Notes (Signed)
Family request that patient stays on venturi mask and does not want to use CPAP

## 2015-08-12 NOTE — Telephone Encounter (Signed)
Called Stepdown ICU  And spoke with Fredric Mare, RN.  She reports that at this time, the medical physician does not feel that Mr. Marcussen is stable enough to come to our department.  The Palliative Care Physician will check his assess his status and we will determine if Mr. Ptacek is able to receive simulation today.

## 2015-08-13 MED ORDER — LORAZEPAM 2 MG/ML PO CONC: 1.0000 mg | ORAL | Status: AC | PRN

## 2015-08-13 MED ORDER — SODIUM CHLORIDE 0.9 % IV SOLN: 2.0000 mg/h | INTRAVENOUS | Status: AC

## 2015-08-13 NOTE — Discharge Summary (Signed)
Physician Discharge Summary  [name]  LFY:[id number]  DOB: 06-26-1950  DOA: 07/28/2015  PCP: Logan Kaufmann Dettinger, MD  Admit date: 07/28/2015 Discharge date: 08/13/2015  Time spent: Greater than 30 minutes  Recommendations for Outpatient Follow-up:  1. Discharging to residential hospice at [facility] for end-of-life care.  Discharge Diagnoses:  Principal Problem:   Left Lower Lung Mass and Adenopathy- pt declined Bx - presumed Lung Cancer. Active Problems:   Essential hypertension, benign   Tobacco abuse   Mixed hyperlipidemia   Type 2 diabetes mellitus without complication, with long-term current use of insulin (HCC)   Obstructive sleep apnea   Hemoptysis   Chronic back pain on oxycontin 90 mg daily 30 mg tid   AKI (acute kidney injury) ([place])   Atrial fibrillation with RVR (HCC)   Chest pain on breathing   Weakness of left arm   Acute respiratory failure with hypoxia (HCC)   Stroke-like symptoms   Anemia of chronic disease   Encounter for palliative care   Adjustment disorder with mixed anxiety and depressed mood   Goals of care, counseling/discussion   Acute urinary retention   Acute encephalopathy   Discharge Condition: Poor prognosis.  Diet recommendation: Comfort feeds of choice.  Filed Weights   08/11/15 0500 08/12/15 0600 08/13/15 0453  Weight: 88.9 kg (195 lb 15.8 oz) 88.8 kg (195 lb 12.3 oz) 84.8 kg (186 lb 15.2 oz)    History of present illness:  65 year old male with extensive PMH-type II DM, HTN, HLD, OSA, chronic back pain on opioids, recent clavicular fracture, anxiety, depression, tobacco abuse, recent hospitalization for/4/17-07/27/15 for left sided lung mass, atypical chest pain, postobstructive pneumonia, seen by pulmonology and planned outpatient follow-up for fiberoptic bronchoscopy, presented to [facility] with hemoptysis and chest pain. He was hypoxic in the 80s and hypotensive, in the ED. He has refused diagnostic workup of his lung mass including  bronchoscopy or biopsy or chemotherapy treatment. Oncology consulted and patient was transferred to [facility] stepdown unit on 08/09/15 to start palliative radiation treatment. Hospital course at [facility] complicated by A. fib with RVR, strokelike symptoms, persistent acute hypoxic respiratory failure and mild hemoptysis. Patient rapidly declined on 4/23 with worsening hypoxia and mental status changes. Very poor prognosis. Palliative consulted, met with family and transitioned to full comfort care. Patient was started on IV Dilaudid drip and when necessary IV Ativan. DC to residential hospice at [facility] for end-of-life care.  Hospital Course:   Left lower lung mass and adenopathy - Patient had declined diagnostic workup including bronchoscopy or biopsy and has declined chemotherapy stating that he does not want to suffer. - As per medical oncology consultation 4/18: Need CT abdomen and pelvis as well as MRI brain with and without contrast for staging, tissue diagnosis, radiation oncology consultation for palliative RT to lung/mediastinal mass control hemoptysis and bronchial obstruction, not a good candidate for concurrent chemoradiation, would probably offer chemotherapy if this were small cell lung cancer since symptoms might improve with chemotherapy despite poor functional status. - Radiation oncology consulted 4/21 and recommended transfer to [facility] for radiation therapy - As per radiation oncology, CT simulation and supposed to start radiation on 4/24. - On 4/23, patient rapidly declined with worsening dyspnea, hypoxia, mental status changes/delirium, acute urinary retention. Palliative team consulted on patient & met with patient and significant other over the next couple of days. Given very poor prognosis and ongoing rapid decline and symptoms, he was transitioned to full comfort care including  IV Dilaudid infusion (may be given subcutaneously at [facility]) and when  necessary IV Ativan (changed to sublingual at discharge). Discussed multiple times with significant other [name] page, significant other and discussed with patient's sister and brother-in-law at bedside today. They are in agreement with DC to residential hospice.  Acute hypoxic respiratory failure - Multifactorial secondary to lung mass/cancer, postobstructive pneumonia (treated), COPD and acute diastolic CHF (compensated). - Full comfort care.  Hemoptysis - Secondary to lung mass.  Left-sided chest pain - May be related to lung mass. - Palliative care team assisting with pain management. Pain controlled.  Left-sided weakness - Acute symptoms began on 4/18. DD-metastatic disease. Neurology signed off 4/19. - MRI brain without contrast showed 1 cm bony lesion in the left clivus suggestive of bony metastasis. Since this was done without contrast, could not rule out metastatic disease. - Highly suspicious for brain metastasis. - No strokes seen on MRI.  A. Fib - Rate controlled on amiodarone. Not candidate for anticoagulation secondary to hemoptysis. - Cardiology have seen: As per recommendations on 4/19, amiodarone 200 MG twice a day 2 weeks (starting 4/19) and then decrease to 200 MG daily-may be a good long-term treatment to prevent recurrent A. fib given disease process and overall poor prognosis. - Had approximately 3 hours of A. fib with RVR on 08/11/15 which spontaneously reverted to sinus rhythm. - All medications nonessential to comfort were discontinued.  Postobstructive pneumonia/HCAP - Completed antibiotics  Acute kidney injury - Creatinine on admission was 2.48. This had normalized but has gradually increased to 1.46-likely secondary to IV diuresis. Lasix currently held. Creatinine has plateaued in the 1.4 range. Creatinine stable at 1.4 range.  Recent clavicular fracture/chronic pain  Anemia of chronic disease/iron deficiency - Hemoglobin  stable.  Hyperlipidemia   Anxiety & depression - when necessary Ativan.  Tobacco abuse  Acute diastolic CHF - Received Lasix couple days ago.  OSA - Nightly CPAP-patient refusing and has not been on this due to comfort care option.  GERD   Confusion/delirium/acute encephalopathy - Multifactorial secondary to hypoxia,? Brain metastases and terminal delirium. Management per palliative care team. On prn Ativan and IV Dilaudid drip.  - As per family, patient slept well through the night. Some agitation this morning. Continue comfort oriented care.   COPD    DO NOT RESUSCITATE   Discussed at length with patient's significant other Ms. Zackery Barefoot, patient's sister and brother-in-law at bedside.   Consultants:   Pulmonary and critical care  Cardiology  Palliative care medicine  Medical oncology  Neurology  Radiation oncology  Procedures:   2-D echo 08/06/15: Study Conclusions  - Left ventricle: The cavity size was normal. Wall thickness was  normal. Systolic function was normal. The estimated ejection  fraction was in the range of 60% to 65%. Wall motion was normal;  there were no regional wall motion abnormalities. Doppler  parameters are consistent with abnormal left ventricular  relaxation (grade 1 diastolic dysfunction). - Left atrium: The atrium was normal in size.   Carotid Dopplers 08/08/15: Summary: The right internal carotid artery exhibits 1-39% stenosis.  The left internal carotid artery exhibits elevated peak systolic velocities suggestive of 80-99% stenosis, elevated end diastolic velocities suggestive of low range 60-79% stenosis, and ICA/CCA ratio suggestive of 60-79% stenosis, however there is no evidence of significant plaque morphology to support high grade stenosis.  The left external carotid artery exhibits elevated waveforms suggestive of >50% stenosis.  Discharge Exam:  Complaints: Patient is nonverbal. Briefly opens  eyes. As per family, slept well through the course of the night. Spoke a word or 2 this morning. Slightly restless this morning.   Filed Vitals:   08/13/15 0600 08/13/15 0800 08/13/15 0810 08/13/15 0853  BP:  171/73    Pulse: 108 107    Temp:   99.1 F (37.3 C)   TempSrc:   Axillary   Resp: 17 18    Height:      Weight:      SpO2: 95% 97%  98%    General exam: Pleasant middle-aged male lying comfortably propped up in bed. Does not appear in any distress.  Respiratory system: Reduced breath sounds bilaterally. No increased work of breathing. Cardiovascular system: S1 & S2 heard, RRR. No JVD, murmurs, gallops, clicks or pedal edema. Gastrointestinal system: Abdomen is nondistended, soft and nontender. Normal bowel sounds heard. Central nervous system: Sedated and barely arousable. No focal neurological deficits. Extremities: moving all limbs spontaneously.   Discharge Instructions      Discharge Instructions    Activity as tolerated - No restrictions    Complete by:  As directed      Call MD for:  difficulty breathing, headache or visual disturbances    Complete by:  As directed      Call MD for:  persistant nausea and vomiting    Complete by:  As directed      Call MD for:  severe uncontrolled pain    Complete by:  As directed      Call MD for:  temperature >100.4    Complete by:  As directed      Discharge instructions    Complete by:  As directed   1) Diet: Comfort feeds of choice. 2) oxygen via nasal cannula or facemask for comfort. 3) continue Foley catheter for comfort.            Medication List    STOP taking these medications        amLODipine 10 MG tablet  Commonly known as:  NORVASC     amoxicillin-clavulanate 875-125 MG tablet  Commonly known as:  AUGMENTIN     atenolol 100 MG tablet  Commonly known as:  TENORMIN     atorvastatin 20 MG tablet  Commonly known as:  LIPITOR     buPROPion 150 MG 24 hr tablet  Commonly known as:  WELLBUTRIN XL      fluticasone 50 MCG/ACT nasal spray  Commonly known as:  FLONASE     gabapentin 600 MG tablet  Commonly known as:  NEURONTIN     gemfibrozil 600 MG tablet  Commonly known as:  LOPID     Insulin Degludec 200 UNIT/ML Sopn  Commonly known as:  TRESIBA FLEXTOUCH     JANUVIA 100 MG tablet  Generic drug:  sitaGLIPtin     lisinopril-hydrochlorothiazide 20-12.5 MG tablet  Commonly known as:  PRINZIDE,ZESTORETIC     metFORMIN 1000 MG tablet  Commonly known as:  GLUCOPHAGE     NOVOLOG FLEXPEN 100 UNIT/ML FlexPen  Generic drug:  insulin aspart     omeprazole 40 MG capsule  Commonly known as:  PRILOSEC     oxyCODONE-acetaminophen 5-325 MG tablet  Commonly known as:  PERCOCET/ROXICET     OXYCONTIN 30 MG 12 hr tablet  Generic drug:  oxyCODONE     Pen Needles 31G X 6 MM Misc     predniSONE 20 MG tablet  Commonly known as:  DELTASONE     tiZANidine 4 MG tablet  Commonly known as:  ZANAFLEX     umeclidinium bromide 62.5 MCG/INH Aepb  Commonly known as:  INCRUSE ELLIPTA      TAKE these medications        albuterol 108 (90 Base) MCG/ACT inhaler  Commonly known as:  PROVENTIL HFA;VENTOLIN HFA  Inhale 2 puffs into the lungs every 4 (four) hours as needed for wheezing or shortness of breath.     fluticasone furoate-vilanterol 100-25 MCG/INH Aepb  Commonly known as:  BREO ELLIPTA  Inhale 1 puff into the lungs daily.     HYDROmorphone 25 mg in sodium chloride 0.9 % 47.5 mL  Inject 2 mg/hr into the vein continuous. And may use PRN as follows: 1 mg, Intravenous, Every 30 min PRN, pain, or dyspnea,  May use subcutaneously instead of IV.     LORazepam 2 MG/ML concentrated solution  Commonly known as:  ATIVAN  Take 0.5 mLs (1 mg total) by mouth every 2 (two) hours as needed for anxiety.         Get Medicines reviewed and adjusted: Please take all your medications with you for your next visit with your Primary MD  Please request your Primary MD to go over all hospital tests and  procedure/radiological results at the follow up. Please ask your Primary MD to get all Hospital records sent to his/her office.  If you experience worsening of your admission symptoms, develop shortness of breath, life threatening emergency, suicidal or homicidal thoughts you must seek medical attention immediately by calling 911 or calling your MD immediately if symptoms less severe.  You must read complete instructions/literature along with all the possible adverse reactions/side effects for all the Medicines you take and that have been prescribed to you. Take any new Medicines after you have completely understood and accept all the possible adverse reactions/side effects.   Do not drive when taking pain medications.   Do not take more than prescribed Pain, Sleep and Anxiety Medications  Special Instructions: If you have smoked or chewed Tobacco in the last 2 yrs please stop smoking, stop any regular Alcohol and or any Recreational drug use.  Wear Seat belts while driving.  Please note  You were cared for by a hospitalist during your hospital stay. Once you are discharged, your primary care physician will handle any further medical issues. Please note that NO REFILLS for any discharge medications will be authorized once you are discharged, as it is imperative that you return to your primary care physician (or establish a relationship with a primary care physician if you do not have one) for your aftercare needs so that they can reassess your need for medications and monitor your lab values.    The results of significant diagnostics from this hospitalization (including imaging, microbiology, ancillary and laboratory) are listed below for reference.    Significant Diagnostic Studies: Dg Chest 2 View  08/06/2015  CLINICAL DATA:  Shortness of breath today. Cough and hemoptysis. Lung mass. Initial encounter. EXAM: CHEST  2 VIEW COMPARISON:  CT chest 07/24/2015. Single view of the chest  08/03/2015 and 07/28/2015. FINDINGS: The lungs are emphysematous. Interstitial and airspace opacities which are worse on the left persists but aeration is improved since the most recent examination. Mediastinal and hilar lymphadenopathy is unchanged in appearance. There are small bilateral pleural effusions. The patient's lung mass is not well scratch the patient's lung mass is better seen on the prior CT. IMPRESSION: Improved aeration bilaterally.  No new abnormality. Mediastinal and hilar lymphadenopathy in patient  with known left lower lobe pulmonary nodules. Small bilateral pleural effusions. Electronically Signed   By: Inge Rise M.D.   On: 08/06/2015 13:19   Dg Chest 2 View  07/23/2015  CLINICAL DATA:  Cough and dyspnea for several weeks. Hemoptysis tonight. EXAM: CHEST  2 VIEW COMPARISON:  06/16/2015 FINDINGS: There is mild hyperinflation. There is moderate vascular and interstitial prominence which has worsened. There is increased interstitial fluid or thickening in the basilar periphery. There is worsened alveolar opacity in the lingula. These findings may represent congestive heart failure with asymmetric alveolar edema. There is no pleural effusion. IMPRESSION: Worsened vascular and interstitial changes. Worsened lingular alveolar opacity. This may represent congestive failure but an infectious infiltrate is not entirely excluded. Electronically Signed   By: Andreas Newport M.D.   On: 07/23/2015 21:21   Ct Head Wo Contrast  08/07/2015  CLINICAL DATA:  Code stroke.  Left-sided weakness. EXAM: CT HEAD WITHOUT CONTRAST TECHNIQUE: Contiguous axial images were obtained from the base of the skull through the vertex without intravenous contrast. COMPARISON:  06/16/2015 FINDINGS: Mild cerebral atrophy. No ventricular dilatation. No mass effect or midline shift. No abnormal extra-axial fluid collections. Gray-white matter junctions are distinct. Basal cisterns are not effaced. No evidence of acute  intracranial hemorrhage. No depressed skull fractures. Visualized paranasal sinuses and mastoid air cells are not opacified. IMPRESSION: No acute intracranial abnormalities. These results were called by telephone at the time of interpretation on 08/07/2015 at 1:20 am to Dr. Sallyanne Havers , who verbally acknowledged these results. Electronically Signed   By: Lucienne Capers M.D.   On: 08/07/2015 01:21   Ct Angio Chest Pe W/cm &/or Wo Cm  07/24/2015  CLINICAL DATA:  Hemoptysis. Sudden onset of chest pain yesterday afternoon. Pain radiates to the back. EXAM: CT ANGIOGRAPHY CHEST WITH CONTRAST TECHNIQUE: Multidetector CT imaging of the chest was performed using the standard protocol during bolus administration of intravenous contrast. Multiplanar CT image reconstructions and MIPs were obtained to evaluate the vascular anatomy. CONTRAST:  100 mL Isovue 370 IV COMPARISON:  Chest radiographs 5 hours prior.  Chest CT 12/20/2010 FINDINGS: Mediastinum/Lymph Nodes: Extensive multifocal mediastinal adenopathy. Enlarged lymph nodes in the right upper paratracheal, highest mediastinum, right lower paratracheal, AP window, and subcarinal stations. Subcarinal soft tissue density is unable to be separated from the esophagus, however measures at least 5.6 cm transverse dimension. Extensive left hilar soft tissue density, contiguous with multi focal nodular soft tissue density in the left lower lobe. This soft tissue density leads to narrowing of the left lower lobe pulmonary arteries without definite invasion. No discrete filling defects within the pulmonary arteries. Some degree of occlusion of the left lower lobe pulmonary vein. Thoracic aorta is normal in caliber. No pericardial effusion. There is a prominent right hilar lymph node. Heart is upper limits of normal in size, there are coronary artery calcifications. Lungs/Pleura: Multifocal innumerable left lower lobe nodules, confluent in the infrahilar region, with a masslike  confluence measuring 3.2 x 3.2 cm. More superiorly this confluence measures 4.1 cm 4.1 x 3.5 cm and includes a segmental branch of the left lower lobe pulmonary artery. Associated diffuse ground-glass and ill-defined opacity in the left lower lobe. Paramediastinal nodule in the lingula measures 1.5 cm. There is fissural thickening of the interlobar fissure. Diffuse septal thickening in the left lower lobe. Bubbly dependent debris in the left mainstem bronchus extending into the lower lobe with bronchial thickening and luminal narrowing. No definite nodule in the right lung. Moderate emphysema.  Trace left pleural effusion. No right pleural effusion. Upper abdomen: Portions of both adrenal glands are normal, however not entirely excluded. No evidence of focal lesion in the liver allowing for phase of contrast. No acute abnormality in the included upper abdomen. Musculoskeletal: Right mid clavicle fracture with minimal surrounding callus formation. No blastic or destructive lytic lesions. Review of the MIP images confirms the above findings. IMPRESSION: 1. Extensive masslike nodular opacities in the left lower lobe, with a confluent infrahilar mass measuring 3.2 x 3.2 cm, many of which are contiguous with left hilar soft tissue density. There is extensive multifocal mediastinal adenopathy. 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 These masslike opacities encase the left lower lobe pulmonary artery leads luminal narrowing but no discrete invasion or frank pulmonary embolus. Multiple ground-glass opacities in the left lower lobe, may be pneumonitis, postobstructive atelectasis or spread of malignancy. 2. Additional lingular nodule measures 1.5 cm. 3. Bubbly density in the left mainstem bronchus and left lower lobe, with left lung bronchial thickening. This is likely secretions or aspiration. 4. Moderate emphysema. Electronically Signed   By: Jeb Levering M.D.   On:  07/24/2015 02:17   Mr Virgel Paling Wo Contrast  08/08/2015  CLINICAL DATA:  65 year old male with left side weakness. Recently discovered left lung mass. Initial encounter. EXAM: MRI HEAD WITHOUT CONTRAST MRA HEAD WITHOUT CONTRAST TECHNIQUE: Multiplanar, multiecho pulse sequences of the brain and surrounding structures were obtained without intravenous contrast. Angiographic images of the head were obtained using MRA technique without contrast. COMPARISON:  Noncontrast head CT 08/07/2015. Cervical spine MRI 09/13/2007. FINDINGS: MRI HEAD FINDINGS Heterogeneous diffusion weighted imaging with no convincing diffusion restriction. Major intracranial vascular flow voids are preserved. Distal left vertebral artery appears dominant. No midline shift, mass effect, evidence of mass lesion, ventriculomegaly, extra-axial collection or acute intracranial hemorrhage. Cervicomedullary junction and pituitary are within normal limits. [place] Cables and white matter signal is within normal limits for age throughout the brain. No contrast administered Visible internal auditory structures appear normal. Mastoids are clear. Small volume retained secretions in the nasopharynx. Trace paranasal sinus mucosal thickening. Negative orbit and scalp soft tissues. Stable visualized cervical spine. There is a 10 mm bone lesion in the left clivus which appears to be new since 2009 and demonstrates restricted diffusion (series 4, image 9 and series 3, image 14). No other suspicious osseous lesion identified. MRA HEAD FINDINGS Study is intermittently degraded by motion artifact despite repeated imaging attempts. Antegrade flow in the posterior circulation with dominant distal left vertebral artery, nondominant diminutive right vertebral artery. Normal left PICA origin. Normal vertebrobasilar junction. Normal dominant appearing right AICA origin. No basilar stenosis. Normal SCA and PCA origins. Normal PCA branches. Antegrade flow in both ICA siphons. No  siphon stenosis. Ophthalmic artery origins are normal. Normal carotid termini, MCA and ACA origins. Anterior communicating artery with median artery of the corpus callosum is noted. Visualized ACA branches are within normal limits. Bilateral MCA M1 segments, MCA bifurcations, and visualized bilateral MCA branches are within normal limits. IMPRESSION: 1. 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 2. No evidence of metastatic disease to the brain on this noncontrast exam, but early metastatic disease to the brain cannot be excluded in the absence of intravenous contrast. 3.  No acute abnormality of the brain is identified. 4.  Negative intracranial MRA. Electronically Signed   By: Genevie Ann M.D.   On: 08/08/2015 11:54   Mr Brain Wo Contrast  08/08/2015  CLINICAL DATA:  65 year old male with left side weakness. Recently discovered left lung mass. Initial encounter. EXAM: MRI HEAD WITHOUT CONTRAST MRA HEAD WITHOUT CONTRAST TECHNIQUE: Multiplanar, multiecho pulse sequences of the brain and surrounding structures were obtained without intravenous contrast. Angiographic images of the head were obtained using MRA technique without contrast. COMPARISON:  Noncontrast head CT 08/07/2015. Cervical spine MRI 09/13/2007. FINDINGS: MRI HEAD FINDINGS Heterogeneous diffusion weighted imaging with no convincing diffusion restriction. Major intracranial vascular flow voids are preserved. Distal left vertebral artery appears dominant. No midline shift, mass effect, evidence of mass lesion, ventriculomegaly, extra-axial collection or acute intracranial hemorrhage. Cervicomedullary junction and pituitary are within normal limits. [place] Cables and white matter signal is within normal limits for age throughout the brain. No contrast administered Visible internal auditory structures appear normal. Mastoids are clear. Small volume retained secretions in the nasopharynx. Trace paranasal sinus mucosal thickening. Negative orbit and  scalp soft tissues. Stable visualized cervical spine. There is a 10 mm bone lesion in the left clivus which appears to be new since 2009 and demonstrates restricted diffusion (series 4, image 9 and series 3, image 14). No other suspicious osseous lesion identified. MRA HEAD FINDINGS Study is intermittently degraded by motion artifact despite repeated imaging attempts. Antegrade flow in the posterior circulation with dominant distal left vertebral artery, nondominant diminutive right vertebral artery. Normal left PICA origin. Normal vertebrobasilar junction. Normal dominant appearing right AICA origin. No basilar stenosis. Normal SCA and PCA origins. Normal PCA branches. Antegrade flow in both ICA siphons. No siphon stenosis. Ophthalmic artery origins are normal. Normal carotid termini, MCA and ACA origins. Anterior communicating artery with median artery of the corpus callosum is noted. Visualized ACA branches are within normal limits. Bilateral MCA M1 segments, MCA bifurcations, and visualized bilateral MCA branches are within normal limits. IMPRESSION: 1. 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 2. No evidence of metastatic disease to the brain on this noncontrast exam, but early metastatic disease to the brain cannot be excluded in the absence of intravenous contrast. 3.  No acute abnormality of the brain is identified. 4.  Negative intracranial MRA. Electronically Signed   By: Genevie Ann M.D.   On: 08/08/2015 11:54   Nm Pulmonary Perf And Vent  07/29/2015  CLINICAL DATA:  Patient with postsurgical chest pain and hypoxia. Patient could not elevate her arms. EXAM: NUCLEAR MEDICINE VENTILATION - PERFUSION LUNG SCAN TECHNIQUE: Ventilation images were obtained in multiple projections using inhaled aerosol Tc-83mDTPA. Perfusion images were obtained in multiple projections after intravenous injection of Tc-919mAA. RADIOPHARMACEUTICALS:  31.1 mCi Technetium-9975mPA aerosol inhalation and 4.2 mCi  Technetium-55m66m IV COMPARISON:  Chest radiograph, 07/28/2015 FINDINGS: Ventilation: Decreased ventilation to left lung base corresponding to the area of airspace consolidation as well as overall decreased activity in the left lung when compared to the right. Perfusion: Normal profusion to the right lung. Decreased profusion seen throughout the left lung with no discrete segmental defect. IMPRESSION: 1. Decreased profusion to the entire left lung. There is also decreased ventilation to the left lung and more discrete ventilatory defect the left lung base where there is consolidation on the current chest radiograph. This is an intermediate probability study for pulmonary thromboembolism based on modified prior PET criteria. Consider followup CTA of the chest if this patient can tolerate that procedure. Electronically Signed   By: DaviLajean Manes.   On: 07/29/2015 12:08   Dg Chest Port 1 View  08/11/2015  CLINICAL  DATA:  Short of breath, hypertension EXAM: PORTABLE CHEST 1 VIEW COMPARISON:  None. FINDINGS: Stable enlarged cardiac silhouette. There is increased density in the LEFT lobe airspace disease. Persistent RIGHT upper lobe airspace disease and more diffuse bilateral airspace disease again noted. IMPRESSION: Worsening bilateral airspace disease representing edema versus multifocal pneumonia. Electronically Signed   By: Suzy Bouchard M.D.   On: 08/11/2015 11:21   Dg Chest Port 1 View  08/03/2015  CLINICAL DATA:  65 year old male with shortness of breath and wheezing EXAM: PORTABLE CHEST 1 VIEW COMPARISON:  Prior chest x-ray 07/28/2015; prior chest CT 07/24/2015 FINDINGS: Interval development of diffuse bilateral predominantly interstitial opacities. There is more dense opacifications in the left base obscuring the heart margin and diaphragm. Mediastinal fullness with bulky nodular opacities in the right paratracheal stripe are similar compared to prior. Left lower lobe pulmonary nodules or now  obscured. No acute osseous abnormality. IMPRESSION: 1. Interval development of mild pulmonary edema concerning for volume overload versus CHF. 2. Possible left pleural effusion. 3. Similar appearance of mediastinal and left hilar adenopathy. The known left lower lobe pulmonary nodules are now obscured from view by the edema and possible left-sided pleural effusion. Electronically Signed   By: Jacqulynn Cadet M.D.   On: 08/03/2015 11:35   Dg Chest Port 1 View  07/28/2015  CLINICAL DATA:  Chest pain and dyspnea, onset today.  Hypotension. EXAM: PORTABLE CHEST 1 VIEW COMPARISON:  07/23/2015 FINDINGS: There is worsened left lower lobe consolidation in this may represent postobstructive pneumonia. Hemorrhage, aspiration, neoplasm could also produce this appearance. There is hilar and mediastinal adenopathy. Right lung is clear. No large effusions. Normal pulmonary vasculature. Chronic right clavicle midshaft fracture deformity. IMPRESSION: Worsened left lower lobe consolidation. Electronically Signed   By: Andreas Newport M.D.   On: 07/28/2015 22:51    Microbiology: Recent Results (from the past 240 hour(s))  MRSA PCR Screening     Status: None   Collection Time: 08/09/15  5:33 PM  Result Value Ref Range Status   MRSA by PCR NEGATIVE NEGATIVE Final    Comment:        The GeneXpert MRSA Assay (FDA approved for NASAL specimens only), is one component of a comprehensive MRSA colonization surveillance program. It is not intended to diagnose MRSA infection nor to guide or monitor treatment for MRSA infections.      Labs: Basic Metabolic Panel:  Recent Labs Lab 08/07/15 0025 08/08/15 0236 08/10/15 1051 08/12/15 0335  NA 135 135 134* 139  K 4.4 4.5 4.3 5.1  CL 96* 91* 89* 93*  CO2 27 29 33* 36*  GLUCOSE 143* 169* 230* 226*  BUN 13 15 22* 28*  CREATININE 1.26* 1.46* 1.47* 1.41*  CALCIUM 8.4* 8.5* 8.6* 9.5   Liver Function Tests: No results for input(s): AST, ALT, ALKPHOS, BILITOT,  PROT, ALBUMIN in the last 168 hours. No results for input(s): LIPASE, AMYLASE in the last 168 hours. No results for input(s): AMMONIA in the last 168 hours. CBC:  Recent Labs Lab 08/07/15 0025 08/08/15 0236 08/10/15 1051  WBC 13.4* 14.0* 12.2*  HGB 9.4* 9.4* 9.0*  HCT 28.9* 29.8* 27.8*  MCV 85.8 85.9 85.0  PLT 171 202 191   Cardiac Enzymes: No results for input(s): CKTOTAL, CKMB, CKMBINDEX, TROPONINI in the last 168 hours. BNP: BNP (last 3 results)  Recent Labs  07/23/15 2241 08/06/15 1158  BNP 12.0 11.6    ProBNP (last 3 results) No results for input(s): PROBNP in the last 8760 hours.  CBG:  Recent Labs Lab 08/11/15 0759 08/11/15 1135 08/11/15 1603 08/11/15 2224 08/12/15 0730  GLUCAP 189* 190* 163* 195* 207*       Signed:  Vernell Leep, MD, FACP, FHM. [facility] Pager [phone]  If 7PM-7AM, please contact night-coverage [email or web address] Password [email or web address] 08/13/2015, 10:30 AM

## 2015-08-13 NOTE — Progress Notes (Signed)
OT Cancellation Note  Patient Details Name: [name] MRN: [id number] DOB: 1950/09/07   Cancelled Treatment:     Reason Eval/Treat Not Completed: OT will sign off.  Kaleem Sartwell A 08/13/2015, 8:19 AM

## 2015-08-13 NOTE — Progress Notes (Signed)
Called report to [facility]. Awaiting PTAR to transport.

## 2015-08-13 NOTE — Consult Note (Signed)
[facility] room is available today, 08/13/15 for Mr. Herbig. Met with his daughter Patrici Ranks and girl friend Baker Janus yesterday afternoon to explain services. At that time Patrici Ranks gave permission for Baker Janus to complete paper work this morning, which she did. Dr. Tomasa Hosteller to assume care per family request. Spoke with Dr. Algis Liming and bedside RN regarding plan to continue subq drip at [facility].   Please fax discharge summary to [phone].  RN please call report to [phone].  Thank you. Erling Conte, [place]

## 2015-08-13 NOTE — Progress Notes (Signed)
Daily Progress Note   Patient Name: [name]       Date: 08/13/2015 DOB: Jul 15, 1950  Age: 65 y.o. MRN#: [id number] Attending Physician: Modena Jansky, MD Primary Care Physician: Fransisca Kaufmann Dettinger, MD Admit Date: 07/28/2015  Reason for Consultation/Follow-up: non pain symptom management of dyspnea, goals of care.   Subjective: Patient restless and agitated at times, at times is resting comfortably. D/w his RN who reports that he has periods of agitation, but these improve with PRN meds.  Interval Events/ Family meeting: Patient is not awake nor alert anymore  I met this AM with family including his fiancee, sister, brother, and brother in law.  They report meeting this AM with Erling Conte and plan to transition to [facility] today.  Continue Dilaudid infusion IV, patient has history of chronic pain, he is at present comfortable on the IV Dilaudid infusion.  He has Ativan IV PRN, this can be changed to SL at hospice  Length of Stay: 15 days  Current Medications: Scheduled Meds:  . fluticasone furoate-vilanterol  1 puff Inhalation Daily  . ipratropium  0.5 mg Nebulization TID  . isosorbide mononitrate  60 mg Oral Daily  . levalbuterol  0.63 mg Nebulization TID    Continuous Infusions: . HYDROmorphone 2 mg/hr (08/13/15 0644)    PRN Meds: fluticasone, guaiFENesin-dextromethorphan, HYDROmorphone **AND** HYDROmorphone, levalbuterol, LORazepam, ondansetron **OR** ondansetron (ZOFRAN) IV, technetium TC 33M diethylenetriame-pentaacetic acid  Physical Exam: Physical Exam             Restless, does not open eyes or follow commands Mild distress S1 S2 Coarse rhonchi scattered wheeze Abdomen soft Trace edema   Vital Signs: BP 171/73 mmHg  Pulse 107  Temp(Src) 99.1 F  (37.3 C) (Axillary)  Resp 18  Ht _0  (1.803 m)  Wt 84.8 kg (186 lb 15.2 oz)  BMI 26.09 kg/m2  SpO2 98% SpO2: SpO2: 98 % O2 Device: O2 Device: Venturi Mask O2 Flow Rate: O2 Flow Rate (L/min): 14 L/min  Intake/output summary:   Intake/Output Summary (Last 24 hours) at 08/13/15 0948 Last data filed at 08/13/15 0600  Gross per 24 hour  Intake    188 ml  Output   1035 ml  Net   -847 ml   LBM: Last BM Date: 08/09/15 Baseline Weight: Weight: 91.4 kg (201  lb 8 oz) Most recent weight: Weight: 84.8 kg (186 lb 15.2 oz)       Palliative Assessment/Data: Flowsheet Rows        Most Recent Value   Intake Tab    Referral Department  Hospitalist   Unit at Time of Referral  ICU   Palliative Care Primary Diagnosis  Cancer   Palliative Care Type  New Palliative care   Reason for referral  Non-pain Symptom, Clarify Goals of Care   Date first seen by Palliative Care  08/10/15   Clinical Assessment    Palliative Performance Scale Score  30%   Pain Max last 24 hours  7   Pain Min Last 24 hours  6   Dyspnea Max Last 24 Hours  8   Dyspnea Min Last 24 hours  7   Psychosocial & Spiritual Assessment    Palliative Care Outcomes    Patient/Family meeting held?  Yes   Who was at the meeting?  patient    Palliative Care follow-up planned  Yes, Facility      Additional Data Reviewed: CBC    Component Value Date/Time   WBC 12.2* 08/10/2015 1051   RBC 3.27* 08/10/2015 1051   RBC 4.38 07/24/2015 0334   HGB 9.0* 08/10/2015 1051   HCT 27.8* 08/10/2015 1051   PLT 191 08/10/2015 1051   MCV 85.0 08/10/2015 1051   MCH 27.5 08/10/2015 1051   MCHC 32.4 08/10/2015 1051   RDW 14.5 08/10/2015 1051   LYMPHSABS 0.7 07/31/2015 0242   MONOABS 1.3* 07/31/2015 0242   EOSABS 0.0 07/31/2015 0242   BASOSABS 0.0 07/31/2015 0242    CMP     Component Value Date/Time   NA 139 08/12/2015 0335   K 5.1 08/12/2015 0335   CL 93* 08/12/2015 0335   CO2 36* 08/12/2015 0335   GLUCOSE 226* 08/12/2015 0335    BUN 28* 08/12/2015 0335   CREATININE 1.41* 08/12/2015 0335   CALCIUM 9.5 08/12/2015 0335   PROT 7.0 07/28/2015 2141   ALBUMIN 3.3* 07/28/2015 2141   AST 37 07/28/2015 2141   ALT 21 07/28/2015 2141   ALKPHOS 68 07/28/2015 2141   BILITOT 0.7 07/28/2015 2141   GFRNONAA 52* 08/12/2015 0335   GFRAA >60 08/12/2015 0335       Problem List:  Patient Active Problem List   Diagnosis Date Noted  . Acute urinary retention 08/12/2015  . Acute encephalopathy   . Goals of care, counseling/discussion   . Anemia of chronic disease 08/10/2015  . Encounter for palliative care   . Adjustment disorder with mixed anxiety and depressed mood   . Weakness of left arm   . Acute respiratory failure with hypoxia ([place])   . Stroke-like symptoms   . Chest pain on breathing   . Atrial fibrillation with RVR ([place])   . AKI (acute kidney injury) ([place]) 07/29/2015  . 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 Chronic back pain on oxycontin 90 mg daily 30 mg tid 07/26/2015  . 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 Chronic narcotic use oxycontin 30 mg tid 07/26/2015  . Weight loss 10 lbs in one month 07/26/2015  . Dysphagia, pharyngoesophageal phase   . GI bleeding 07/24/2015  . Left Lower Lung Mass and Adenopathy- pt declined Bx - presumed Lung Cancer.   . Hemoptysis   . Fracture of shaft of clavicle 06/18/2015  . Obstructive sleep apnea 04/12/2015  . Type 2 diabetes mellitus without complication,  with long-term current use of insulin ([place]) 03/01/2015  . Coronary atherosclerosis of native coronary artery 02/13/2011  . Essential hypertension, benign 02/13/2011  . Tobacco abuse 02/13/2011  . Peripheral arterial disease ([place]) 02/13/2011  . Mixed hyperlipidemia 02/13/2011     Palliative Care Assessment & Plan    1.Code Status:  DNR    Code Status Orders        Start     Ordered   07/29/15 0106  Do not attempt resuscitation (DNR)   Continuous    Question  Answer Comment  In the event of cardiac or respiratory ARREST Do not call a "code blue"   In the event of cardiac or respiratory ARREST Do not perform Intubation, CPR, defibrillation or ACLS   In the event of cardiac or respiratory ARREST Use medication by any route, position, wound care, and other measures to relive pain and suffering. May use oxygen, suction and manual treatment of airway obstruction as needed for comfort.      07/29/15 0109    Code Status History    Date Active Date Inactive Code Status Order ID Comments User Context   07/29/2015  1:09 AM  DNR [id number]  Alesia Richards, MD ED   07/24/2015  2:38 AM 07/27/2015  4:24 PM Full Code [id number]  Orvan Falconer, MD Inpatient   02/14/2014  8:08 PM 02/15/2014 12:53 AM Full Code [id number]  Shanda Howells, MD ED       2. Goals of Care/Additional Recommendations:  Plan for residential hospice for EOL care  3. Symptom Management:      1. Pain: Currently on dilaudid continuous infusion.  Resting comfortably at this time.  Did receive 6 rescue doses overnight.  Will need to continue to have aggressive symptom management and will best be served by residential hospice placement.  2. Agitation/anxiety: continue ativan as needed   4. Palliative Prophylaxis:   Bowel Regimen  5. Prognosis: hours to days 6. Discharge Planning:  Residential hospice- To [facility] today per family and bedside RN   Care plan was discussed with patient's siblings, significant other Ms Page, bedside ICU RN.   Thank you for allowing the Palliative Medicine Team to assist in the care of this patient.   Time In:  0910 Time Out: 0930 Total Time 20 Prolonged Time Billed  no        [id number] Micheline Rough, MD  08/13/2015, 9:48 AM  Please contact Palliative Medicine Team phone at [phone] for questions and concerns.

## 2015-08-13 NOTE — Progress Notes (Signed)
Pt discharged to [facility] via [facility]. Family present at bedside and updated on address of facility.

## 2015-08-13 NOTE — Progress Notes (Signed)
Per Erling Conte with [facility], dc summary faxed and they are ready to accept pt. PTAR arranged and RN to call report. Pt's family at bedside. CSW signing off at d/c.   Wandra Feinstein, MSW, LCSW

## 2015-08-13 NOTE — Progress Notes (Signed)
PT Cancellation Note  Patient Details Name: [name] MRN: [id number] DOB: 1950/06/26   Cancelled Treatment:    Reason Eval/Treat Not Completed: PT will sign off.    Weston Anna, MPT Pager: [phone]

## 2015-08-19 DEATH — deceased

## 2015-10-25 ENCOUNTER — Telehealth: Payer: Self-pay | Admitting: Family Medicine

## 2017-02-05 IMAGING — CR DG CHEST 1V PORT
1 series · 1 of 1 positions shown · non-contrast
Comparison: Prior chest x-ray 07/28/2015; prior chest CT 07/24/2015

CLINICAL DATA: 64-year-old male with shortness of breath and
wheezing

EXAM:
PORTABLE CHEST 1 VIEW

[AP]
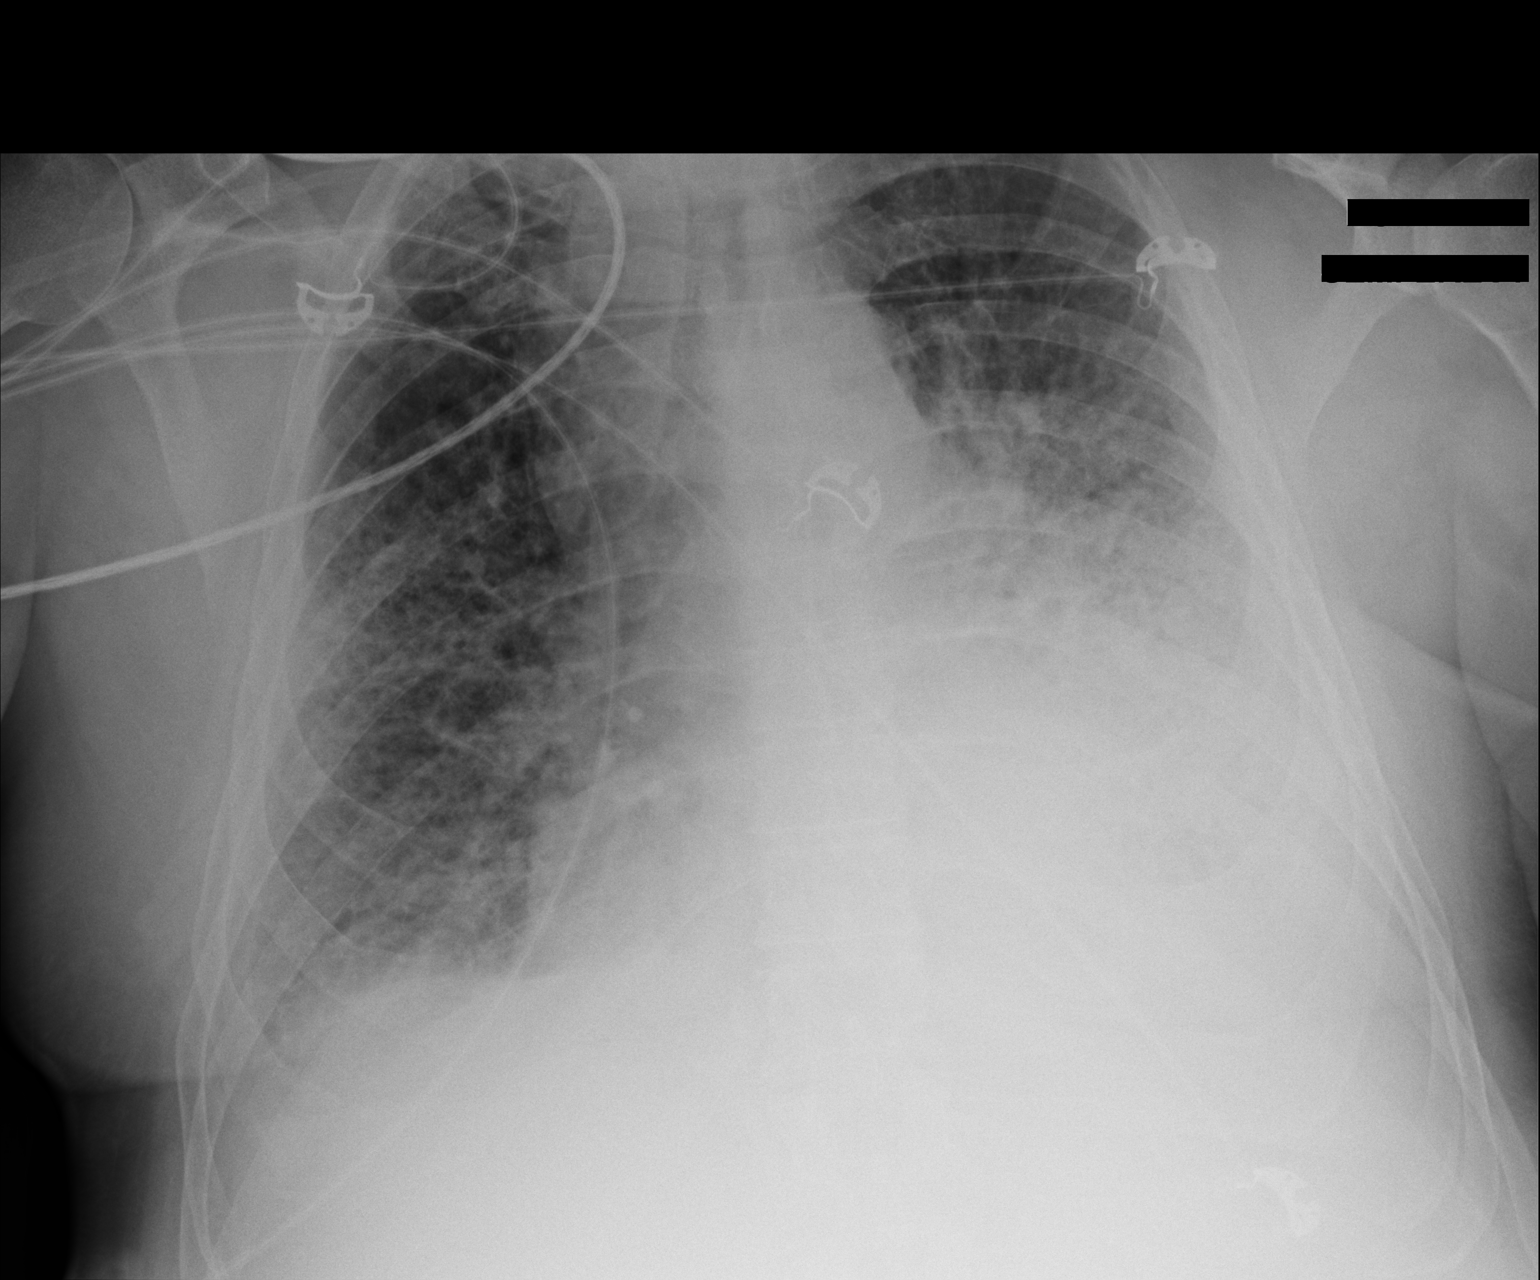

[1 of 1 positions shown; findings below may reference images not displayed]

FINDINGS: Interval development of diffuse bilateral predominantly interstitial
opacities. There is more dense opacifications in the left base
obscuring the heart margin and diaphragm. Mediastinal fullness with
bulky nodular opacities in the right paratracheal stripe are similar
compared to prior. Left lower lobe pulmonary nodules or now
obscured. No acute osseous abnormality.
IMPRESSION: 1. Interval development of mild pulmonary edema concerning for
volume overload versus CHF.
2. Possible left pleural effusion.
3. Similar appearance of mediastinal and left hilar adenopathy. The
known left lower lobe pulmonary nodules are now obscured from view
by the edema and possible left-sided pleural effusion.

## 2017-02-10 IMAGING — MR MR MRA HEAD W/O CM
9 of 11 series · 31 of 48 positions shown · non-contrast
Comparison: Noncontrast head CT 08/07/2015. Cervical spine MRI
09/13/2007.

CLINICAL DATA: 64-year-old male with left side weakness. Recently
discovered left lung mass. Initial encounter.

EXAM:
MRI HEAD WITHOUT CONTRAST
MRA HEAD WITHOUT CONTRAST
TECHNIQUE: Multiplanar, multiecho pulse sequences of the brain and surrounding
structures were obtained without intravenous contrast. Angiographic
images of the head were obtained using MRA technique without
contrast.

[Series 3: T1 · sagittal · 5.0mm · 0.47mm/px · 1 of 24 slices shown]
[im 1/24]
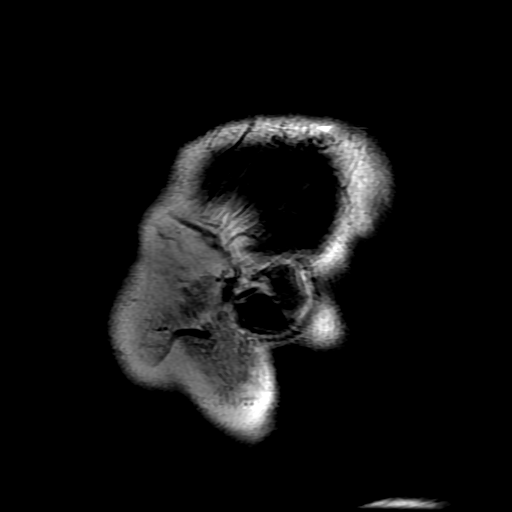

[Series 4: DWI · axial · 3.0mm · 1.09mm/px · z∈[-50,+90]mm · 7 of 96 slices shown (1 of 4)]
[im 1/96]
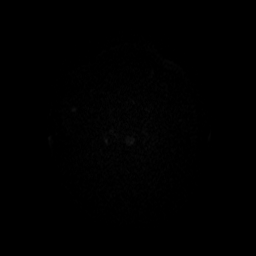
[im 16/96]
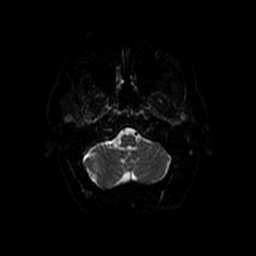
[im 32/96]
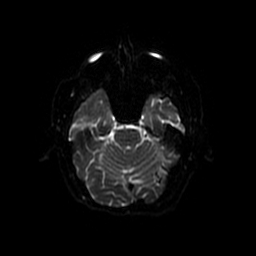
[im 48/96]
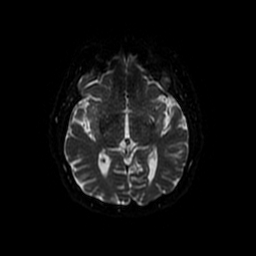
[im 64/96]
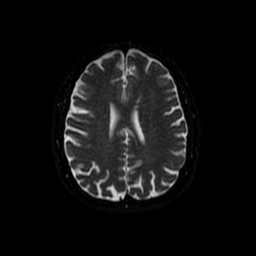
[im 80/96]
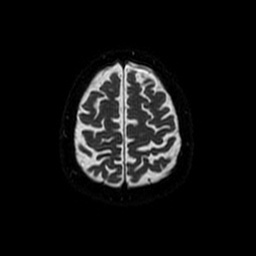
[im 96/96]
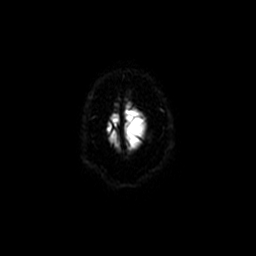

[Series 5: (id) mt fs · axial · 1.4mm · 0.43mm/px · z∈[-68,-10]mm · 5 of 154 slices shown]
[im 1/154]
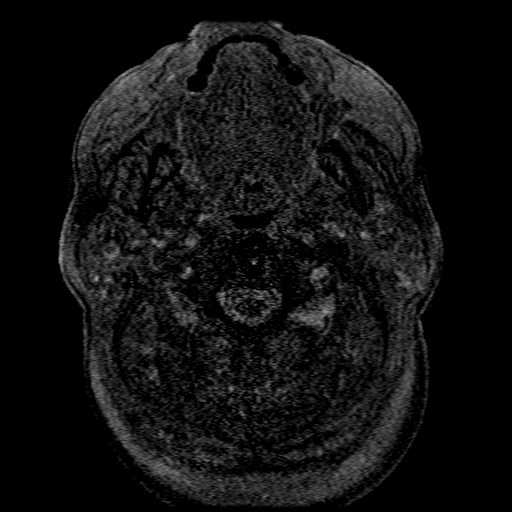
[im 28/154]
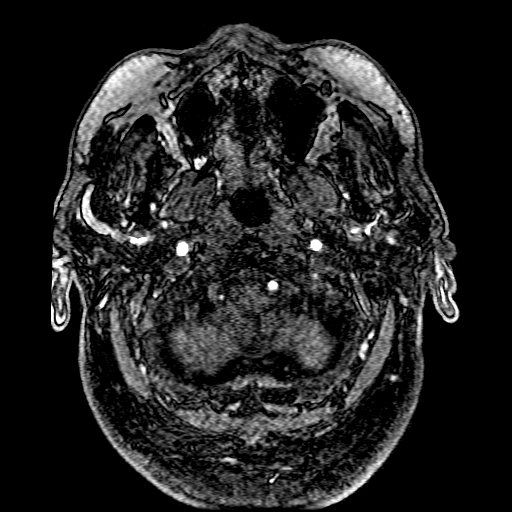
[im 42/154]
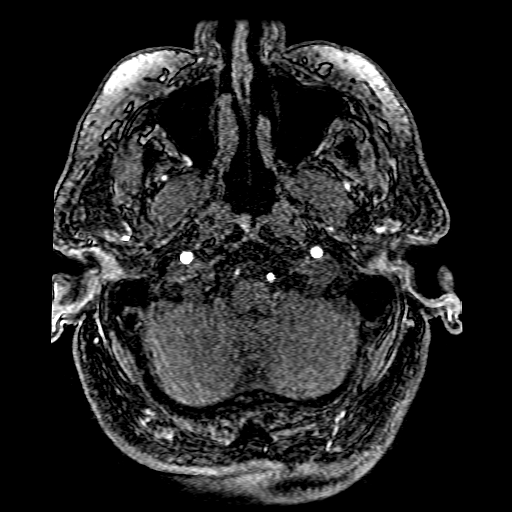
[im 70/154]
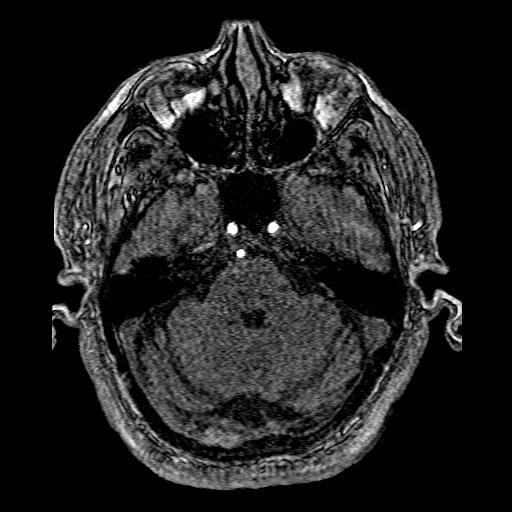
[im 84/154]
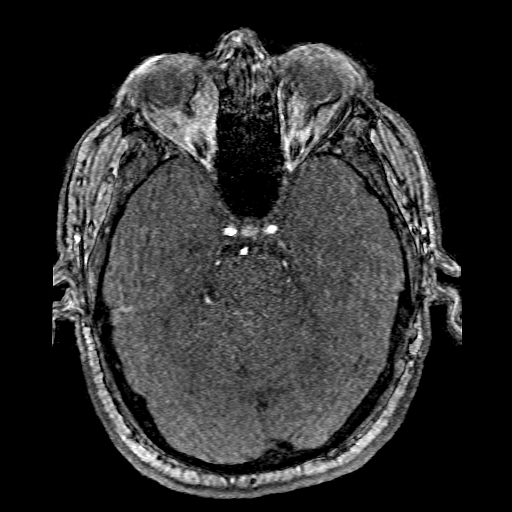

[Series 6: DWI · coronal · 5.0mm · 1.09mm/px · 5 of 70 slices shown (2 of 4)]
[im 1/70]
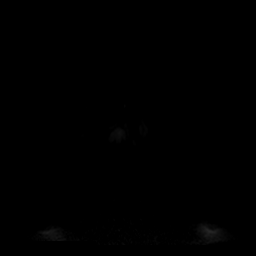
[im 18/70]
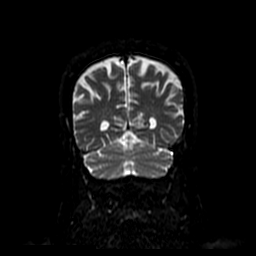
[im 35/70]
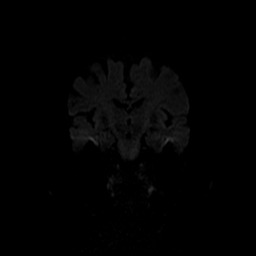
[im 52/70]
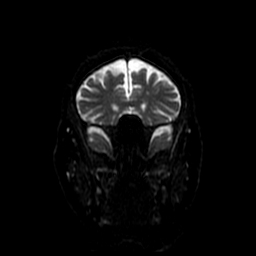
[im 70/70]
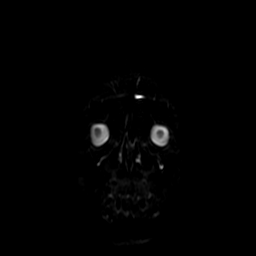

[Series 7: FLAIR · axial · 5.0mm · 0.43mm/px · z∈[-58,+91]mm · 2 of 26 slices shown]
[im 1/26]
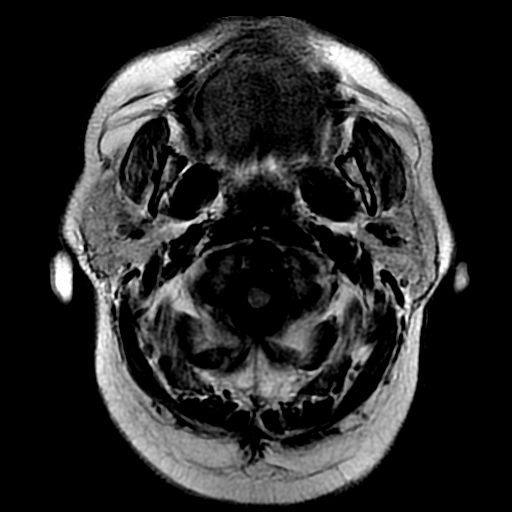
[im 26/26]
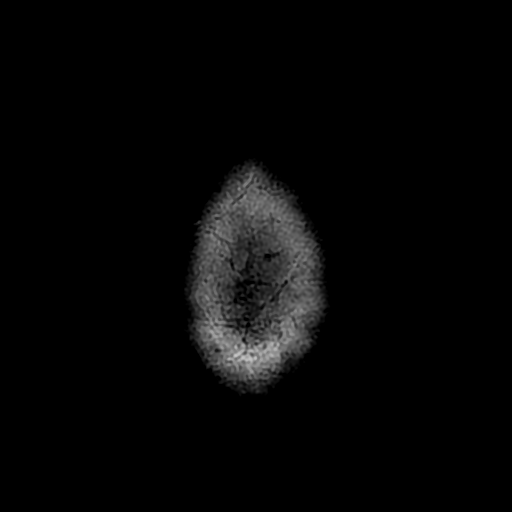

[Series 8: T2 · axial · 5.0mm · 0.43mm/px · z∈[-58,+91]mm · 2 of 26 slices shown (1 of 2)]
[im 1/26]
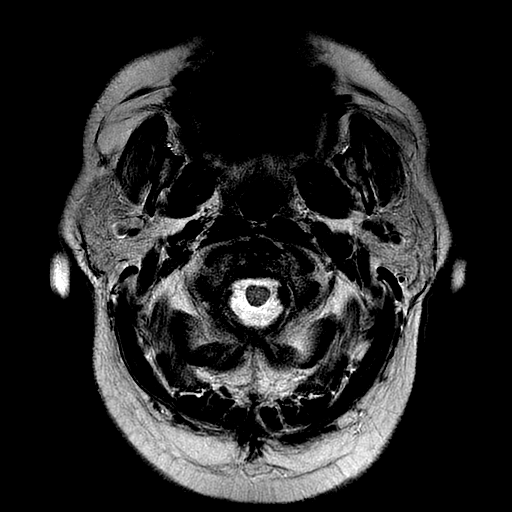
[im 26/26]
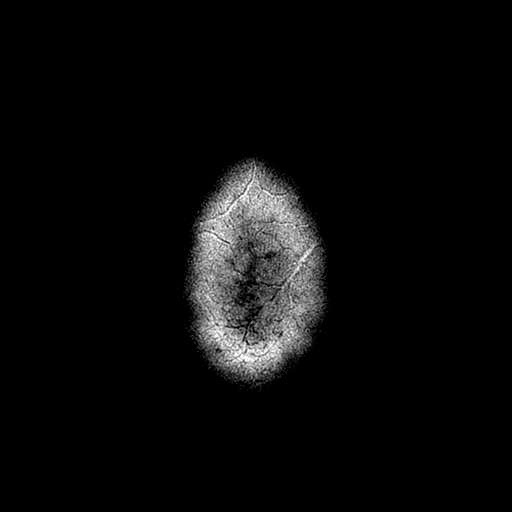

[Series 11: T2 · coronal · 5.0mm · 0.43mm/px · 2 of 30 slices shown (2 of 2)]
[im 1/30]
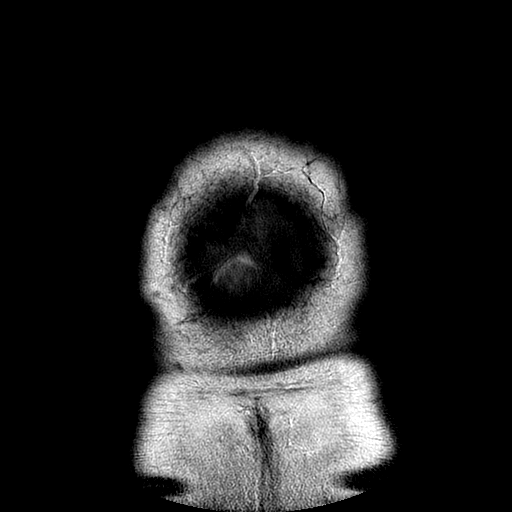
[im 30/30]
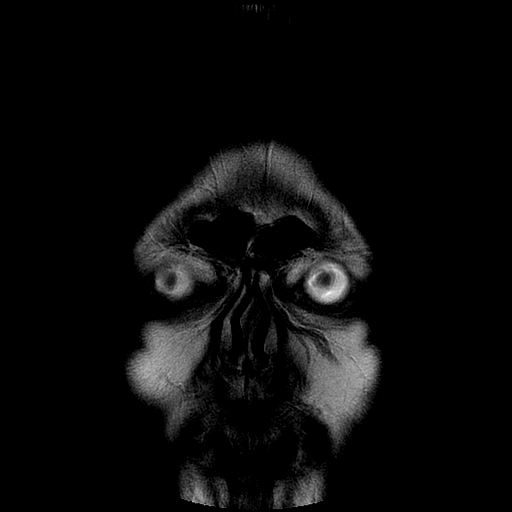

[Series 400: DWI · axial · 3.0mm · 1.09mm/px · z∈[-50,+90]mm · 4 of 48 slices shown (3 of 4)]
[im 1/48]
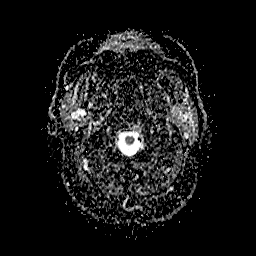
[im 16/48]
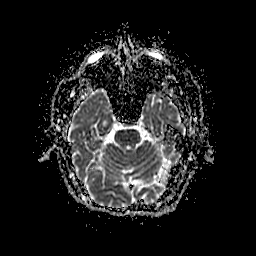
[im 32/48]
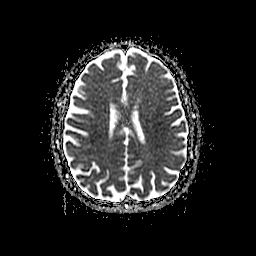
[im 48/48]
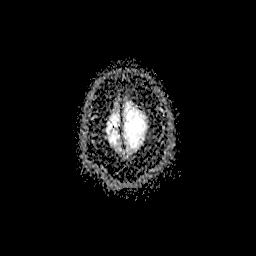

[Series 600: DWI · coronal · 5.0mm · 1.09mm/px · 3 of 35 slices shown (4 of 4)]
[im 1/35]
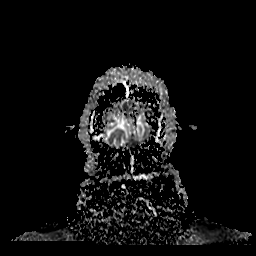
[im 18/35]
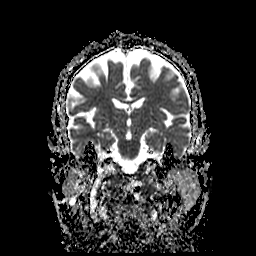
[im 35/35]
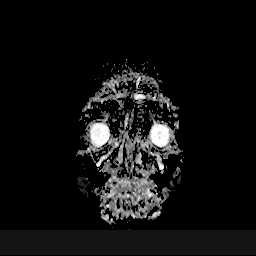

[31 of 48 positions shown; findings below may reference images not displayed]

FINDINGS: MRI HEAD FINDINGS

Heterogeneous diffusion weighted imaging with no convincing
diffusion restriction. Major intracranial vascular flow voids are
preserved. Distal left vertebral artery appears dominant.

No midline shift, mass effect, evidence of mass lesion,
ventriculomegaly, extra-axial collection or acute intracranial
hemorrhage. Cervicomedullary junction and pituitary are within
normal limits. Gray and white matter signal is within normal limits
for age throughout the brain. No contrast administered

Visible internal auditory structures appear normal. Mastoids are
clear. Small volume retained secretions in the nasopharynx. Trace
paranasal sinus mucosal thickening. Negative orbit and scalp soft
tissues.

Stable visualized cervical spine.

There is a 10 mm bone lesion in the left clivus which appears to be
new since 9114 and demonstrates restricted diffusion (series 4,
image 9 and series 3, image 14). No other suspicious osseous lesion
identified.

MRA HEAD FINDINGS

Study is intermittently degraded by motion artifact despite repeated
imaging attempts.

Antegrade flow in the posterior circulation with dominant distal
left vertebral artery, nondominant diminutive right vertebral
artery. Normal left PICA origin. Normal vertebrobasilar junction.
Normal dominant appearing right AICA origin. No basilar stenosis.
Normal SCA and PCA origins. Normal PCA branches.

Antegrade flow in both ICA siphons. No siphon stenosis. Ophthalmic
artery origins are normal. Normal carotid termini, MCA and ACA
origins.

Anterior communicating artery with median artery of the corpus
callosum is noted. Visualized ACA branches are within normal limits.
Bilateral MCA M1 segments, MCA bifurcations, and visualized
bilateral MCA branches are within normal limits.
IMPRESSION: 1. Evidence of small 10 mm bone metastasis to the left clivus.
2. No evidence of metastatic disease to the brain on this
noncontrast exam, but early metastatic disease to the brain cannot
be excluded in the absence of intravenous contrast.
3.  No acute abnormality of the brain is identified.
4.  Negative intracranial MRA.

## 2019-08-29 NOTE — Telephone Encounter (Signed)
error
